# Patient Record
Sex: Female | Born: 1935
Health system: Southern US, Community
[De-identification: ages and names within clinical notes are randomized; demographics above are authoritative.]

## PROBLEM LIST (undated history)

## (undated) DIAGNOSIS — I10 Essential (primary) hypertension: Secondary | ICD-10-CM

## (undated) DIAGNOSIS — N6011 Diffuse cystic mastopathy of right breast: Secondary | ICD-10-CM

## (undated) DIAGNOSIS — Z8679 Personal history of other diseases of the circulatory system: Secondary | ICD-10-CM

## (undated) DIAGNOSIS — N6012 Diffuse cystic mastopathy of left breast: Secondary | ICD-10-CM

## (undated) HISTORY — PX: MENISCUS REPAIR: SHX5179

## (undated) HISTORY — DX: Personal history of other diseases of the circulatory system: Z86.79

## (undated) HISTORY — PX: ROTATOR CUFF REPAIR: SHX139

## (undated) HISTORY — DX: Diffuse cystic mastopathy of left breast: N60.12

## (undated) HISTORY — DX: Diffuse cystic mastopathy of right breast: N60.11

## (undated) HISTORY — PX: BREAST BIOPSY: SHX20

---

## 2012-01-09 DIAGNOSIS — N6019 Diffuse cystic mastopathy of unspecified breast: Secondary | ICD-10-CM | POA: Diagnosis not present

## 2012-01-09 DIAGNOSIS — E785 Hyperlipidemia, unspecified: Secondary | ICD-10-CM | POA: Diagnosis not present

## 2012-01-09 DIAGNOSIS — I10 Essential (primary) hypertension: Secondary | ICD-10-CM | POA: Diagnosis not present

## 2012-02-06 DIAGNOSIS — R109 Unspecified abdominal pain: Secondary | ICD-10-CM | POA: Diagnosis not present

## 2012-02-07 DIAGNOSIS — I10 Essential (primary) hypertension: Secondary | ICD-10-CM | POA: Diagnosis not present

## 2012-02-07 DIAGNOSIS — K439 Ventral hernia without obstruction or gangrene: Secondary | ICD-10-CM | POA: Diagnosis not present

## 2012-05-13 DIAGNOSIS — K219 Gastro-esophageal reflux disease without esophagitis: Secondary | ICD-10-CM | POA: Diagnosis not present

## 2012-05-13 DIAGNOSIS — J310 Chronic rhinitis: Secondary | ICD-10-CM | POA: Diagnosis not present

## 2012-05-13 DIAGNOSIS — H6120 Impacted cerumen, unspecified ear: Secondary | ICD-10-CM | POA: Diagnosis not present

## 2012-05-13 DIAGNOSIS — K2100 Gastro-esophageal reflux disease with esophagitis, without bleeding: Secondary | ICD-10-CM | POA: Diagnosis not present

## 2012-07-02 DIAGNOSIS — E785 Hyperlipidemia, unspecified: Secondary | ICD-10-CM | POA: Diagnosis not present

## 2012-07-02 DIAGNOSIS — N61 Mastitis without abscess: Secondary | ICD-10-CM | POA: Diagnosis not present

## 2012-07-02 DIAGNOSIS — Z1211 Encounter for screening for malignant neoplasm of colon: Secondary | ICD-10-CM | POA: Diagnosis not present

## 2012-07-02 DIAGNOSIS — I498 Other specified cardiac arrhythmias: Secondary | ICD-10-CM | POA: Diagnosis not present

## 2012-07-02 DIAGNOSIS — Z23 Encounter for immunization: Secondary | ICD-10-CM | POA: Diagnosis not present

## 2012-07-02 DIAGNOSIS — I10 Essential (primary) hypertension: Secondary | ICD-10-CM | POA: Diagnosis not present

## 2012-07-15 DIAGNOSIS — K2100 Gastro-esophageal reflux disease with esophagitis, without bleeding: Secondary | ICD-10-CM | POA: Diagnosis not present

## 2012-07-15 DIAGNOSIS — K219 Gastro-esophageal reflux disease without esophagitis: Secondary | ICD-10-CM | POA: Diagnosis not present

## 2012-07-15 DIAGNOSIS — H60509 Unspecified acute noninfective otitis externa, unspecified ear: Secondary | ICD-10-CM | POA: Diagnosis not present

## 2012-08-20 DIAGNOSIS — M549 Dorsalgia, unspecified: Secondary | ICD-10-CM | POA: Diagnosis not present

## 2012-08-20 DIAGNOSIS — E785 Hyperlipidemia, unspecified: Secondary | ICD-10-CM | POA: Diagnosis not present

## 2012-08-20 DIAGNOSIS — I10 Essential (primary) hypertension: Secondary | ICD-10-CM | POA: Diagnosis not present

## 2012-08-20 DIAGNOSIS — R079 Chest pain, unspecified: Secondary | ICD-10-CM | POA: Diagnosis not present

## 2012-10-15 DIAGNOSIS — Z1231 Encounter for screening mammogram for malignant neoplasm of breast: Secondary | ICD-10-CM | POA: Diagnosis not present

## 2012-10-15 DIAGNOSIS — R928 Other abnormal and inconclusive findings on diagnostic imaging of breast: Secondary | ICD-10-CM | POA: Diagnosis not present

## 2012-10-15 DIAGNOSIS — R922 Inconclusive mammogram: Secondary | ICD-10-CM | POA: Diagnosis not present

## 2012-10-27 DIAGNOSIS — N3090 Cystitis, unspecified without hematuria: Secondary | ICD-10-CM | POA: Diagnosis not present

## 2012-10-27 DIAGNOSIS — E7800 Pure hypercholesterolemia, unspecified: Secondary | ICD-10-CM | POA: Diagnosis not present

## 2012-10-27 DIAGNOSIS — I10 Essential (primary) hypertension: Secondary | ICD-10-CM | POA: Diagnosis not present

## 2012-12-23 LAB — BASIC METABOLIC PANEL
Creatinine: 0.9 mg/dL (ref 0.5–1.1)
GLUCOSE: 85 mg/dL
Potassium: 4.7 mmol/L (ref 3.4–5.3)
SODIUM: 138 mmol/L (ref 137–147)

## 2012-12-23 LAB — CBC AND DIFFERENTIAL
Hemoglobin: 14.3 g/dL (ref 12.0–16.0)
PLATELETS: 331 10*3/uL (ref 150–399)
WBC: 11.9 10^3/mL

## 2012-12-23 LAB — HEPATIC FUNCTION PANEL
ALT: 27 U/L (ref 7–35)
AST: 24 U/L (ref 13–35)
Alkaline Phosphatase: 68 U/L (ref 25–125)

## 2013-01-07 DIAGNOSIS — I10 Essential (primary) hypertension: Secondary | ICD-10-CM | POA: Diagnosis not present

## 2013-01-27 DIAGNOSIS — R252 Cramp and spasm: Secondary | ICD-10-CM | POA: Diagnosis not present

## 2013-01-27 DIAGNOSIS — I10 Essential (primary) hypertension: Secondary | ICD-10-CM | POA: Diagnosis not present

## 2013-01-27 LAB — CBC AND DIFFERENTIAL
Hemoglobin: 13.9 g/dL (ref 12.0–16.0)
Platelets: 279 10*3/uL (ref 150–399)
WBC: 9.1 10^3/mL

## 2013-02-07 DIAGNOSIS — N950 Postmenopausal bleeding: Secondary | ICD-10-CM | POA: Diagnosis not present

## 2013-02-07 DIAGNOSIS — D259 Leiomyoma of uterus, unspecified: Secondary | ICD-10-CM | POA: Diagnosis not present

## 2013-02-17 DIAGNOSIS — I10 Essential (primary) hypertension: Secondary | ICD-10-CM | POA: Diagnosis not present

## 2013-02-17 DIAGNOSIS — J029 Acute pharyngitis, unspecified: Secondary | ICD-10-CM | POA: Diagnosis not present

## 2013-02-17 DIAGNOSIS — J0110 Acute frontal sinusitis, unspecified: Secondary | ICD-10-CM | POA: Diagnosis not present

## 2013-03-03 DIAGNOSIS — S93409A Sprain of unspecified ligament of unspecified ankle, initial encounter: Secondary | ICD-10-CM | POA: Diagnosis not present

## 2013-03-03 DIAGNOSIS — M2060 Acquired deformities of toe(s), unspecified, unspecified foot: Secondary | ICD-10-CM | POA: Diagnosis not present

## 2013-04-11 DIAGNOSIS — J209 Acute bronchitis, unspecified: Secondary | ICD-10-CM | POA: Diagnosis not present

## 2013-04-11 DIAGNOSIS — J0110 Acute frontal sinusitis, unspecified: Secondary | ICD-10-CM | POA: Diagnosis not present

## 2013-04-11 DIAGNOSIS — I10 Essential (primary) hypertension: Secondary | ICD-10-CM | POA: Diagnosis not present

## 2013-05-26 DIAGNOSIS — K219 Gastro-esophageal reflux disease without esophagitis: Secondary | ICD-10-CM | POA: Diagnosis not present

## 2013-05-26 DIAGNOSIS — J310 Chronic rhinitis: Secondary | ICD-10-CM | POA: Diagnosis not present

## 2013-06-26 DIAGNOSIS — K2970 Gastritis, unspecified, without bleeding: Secondary | ICD-10-CM | POA: Diagnosis not present

## 2013-07-15 DIAGNOSIS — E669 Obesity, unspecified: Secondary | ICD-10-CM | POA: Diagnosis not present

## 2013-07-15 DIAGNOSIS — E785 Hyperlipidemia, unspecified: Secondary | ICD-10-CM | POA: Diagnosis not present

## 2013-07-15 DIAGNOSIS — Z23 Encounter for immunization: Secondary | ICD-10-CM | POA: Diagnosis not present

## 2013-07-15 DIAGNOSIS — K219 Gastro-esophageal reflux disease without esophagitis: Secondary | ICD-10-CM | POA: Diagnosis not present

## 2013-07-15 DIAGNOSIS — I10 Essential (primary) hypertension: Secondary | ICD-10-CM | POA: Diagnosis not present

## 2013-07-15 LAB — LIPID PANEL
Cholesterol: 174 mg/dL (ref 0–200)
HDL: 67 mg/dL (ref 35–70)
LDL Cholesterol: 84 mg/dL
Triglycerides: 113 mg/dL (ref 40–160)

## 2013-07-15 LAB — TSH: TSH: 2.04 u[IU]/mL (ref 0.41–5.90)

## 2013-08-05 DIAGNOSIS — I10 Essential (primary) hypertension: Secondary | ICD-10-CM | POA: Diagnosis not present

## 2013-10-15 DIAGNOSIS — Z1231 Encounter for screening mammogram for malignant neoplasm of breast: Secondary | ICD-10-CM | POA: Diagnosis not present

## 2013-10-15 DIAGNOSIS — R922 Inconclusive mammogram: Secondary | ICD-10-CM | POA: Diagnosis not present

## 2013-10-15 DIAGNOSIS — R928 Other abnormal and inconclusive findings on diagnostic imaging of breast: Secondary | ICD-10-CM | POA: Diagnosis not present

## 2013-12-29 DIAGNOSIS — IMO0001 Reserved for inherently not codable concepts without codable children: Secondary | ICD-10-CM | POA: Diagnosis not present

## 2013-12-29 DIAGNOSIS — M76899 Other specified enthesopathies of unspecified lower limb, excluding foot: Secondary | ICD-10-CM | POA: Diagnosis not present

## 2014-01-02 DIAGNOSIS — M76899 Other specified enthesopathies of unspecified lower limb, excluding foot: Secondary | ICD-10-CM | POA: Diagnosis not present

## 2014-01-02 DIAGNOSIS — IMO0001 Reserved for inherently not codable concepts without codable children: Secondary | ICD-10-CM | POA: Diagnosis not present

## 2014-01-05 DIAGNOSIS — IMO0001 Reserved for inherently not codable concepts without codable children: Secondary | ICD-10-CM | POA: Diagnosis not present

## 2014-01-05 DIAGNOSIS — M76899 Other specified enthesopathies of unspecified lower limb, excluding foot: Secondary | ICD-10-CM | POA: Diagnosis not present

## 2014-01-08 DIAGNOSIS — IMO0001 Reserved for inherently not codable concepts without codable children: Secondary | ICD-10-CM | POA: Diagnosis not present

## 2014-01-08 DIAGNOSIS — M76899 Other specified enthesopathies of unspecified lower limb, excluding foot: Secondary | ICD-10-CM | POA: Diagnosis not present

## 2014-01-12 DIAGNOSIS — IMO0001 Reserved for inherently not codable concepts without codable children: Secondary | ICD-10-CM | POA: Diagnosis not present

## 2014-01-12 DIAGNOSIS — M76899 Other specified enthesopathies of unspecified lower limb, excluding foot: Secondary | ICD-10-CM | POA: Diagnosis not present

## 2014-01-15 DIAGNOSIS — M76899 Other specified enthesopathies of unspecified lower limb, excluding foot: Secondary | ICD-10-CM | POA: Diagnosis not present

## 2014-01-15 DIAGNOSIS — IMO0001 Reserved for inherently not codable concepts without codable children: Secondary | ICD-10-CM | POA: Diagnosis not present

## 2014-01-19 DIAGNOSIS — M76899 Other specified enthesopathies of unspecified lower limb, excluding foot: Secondary | ICD-10-CM | POA: Diagnosis not present

## 2014-01-19 DIAGNOSIS — IMO0001 Reserved for inherently not codable concepts without codable children: Secondary | ICD-10-CM | POA: Diagnosis not present

## 2014-02-02 DIAGNOSIS — K219 Gastro-esophageal reflux disease without esophagitis: Secondary | ICD-10-CM | POA: Diagnosis not present

## 2014-02-02 DIAGNOSIS — J310 Chronic rhinitis: Secondary | ICD-10-CM | POA: Diagnosis not present

## 2014-02-03 DIAGNOSIS — K219 Gastro-esophageal reflux disease without esophagitis: Secondary | ICD-10-CM | POA: Diagnosis not present

## 2014-02-03 DIAGNOSIS — J309 Allergic rhinitis, unspecified: Secondary | ICD-10-CM | POA: Diagnosis not present

## 2014-02-03 DIAGNOSIS — F3289 Other specified depressive episodes: Secondary | ICD-10-CM | POA: Diagnosis not present

## 2014-02-03 DIAGNOSIS — J387 Other diseases of larynx: Secondary | ICD-10-CM | POA: Diagnosis not present

## 2014-02-03 DIAGNOSIS — E785 Hyperlipidemia, unspecified: Secondary | ICD-10-CM | POA: Diagnosis not present

## 2014-02-03 DIAGNOSIS — I10 Essential (primary) hypertension: Secondary | ICD-10-CM | POA: Diagnosis not present

## 2014-02-23 DIAGNOSIS — D131 Benign neoplasm of stomach: Secondary | ICD-10-CM | POA: Diagnosis not present

## 2014-02-23 DIAGNOSIS — K222 Esophageal obstruction: Secondary | ICD-10-CM | POA: Diagnosis not present

## 2014-02-23 DIAGNOSIS — D1330 Benign neoplasm of unspecified part of small intestine: Secondary | ICD-10-CM | POA: Diagnosis not present

## 2014-02-23 DIAGNOSIS — K319 Disease of stomach and duodenum, unspecified: Secondary | ICD-10-CM | POA: Diagnosis not present

## 2014-03-05 DIAGNOSIS — L820 Inflamed seborrheic keratosis: Secondary | ICD-10-CM | POA: Diagnosis not present

## 2014-03-05 DIAGNOSIS — Q8509 Other neurofibromatosis: Secondary | ICD-10-CM | POA: Diagnosis not present

## 2014-04-30 DIAGNOSIS — S0100XA Unspecified open wound of scalp, initial encounter: Secondary | ICD-10-CM | POA: Diagnosis not present

## 2014-05-05 DIAGNOSIS — I498 Other specified cardiac arrhythmias: Secondary | ICD-10-CM | POA: Diagnosis not present

## 2014-05-05 DIAGNOSIS — G4700 Insomnia, unspecified: Secondary | ICD-10-CM | POA: Diagnosis not present

## 2014-05-05 DIAGNOSIS — I10 Essential (primary) hypertension: Secondary | ICD-10-CM | POA: Diagnosis not present

## 2014-05-05 DIAGNOSIS — I491 Atrial premature depolarization: Secondary | ICD-10-CM | POA: Diagnosis not present

## 2014-05-05 DIAGNOSIS — S0990XA Unspecified injury of head, initial encounter: Secondary | ICD-10-CM | POA: Diagnosis not present

## 2014-06-06 DIAGNOSIS — Z0000 Encounter for general adult medical examination without abnormal findings: Secondary | ICD-10-CM | POA: Diagnosis not present

## 2014-06-08 DIAGNOSIS — K219 Gastro-esophageal reflux disease without esophagitis: Secondary | ICD-10-CM | POA: Diagnosis not present

## 2014-06-08 DIAGNOSIS — J301 Allergic rhinitis due to pollen: Secondary | ICD-10-CM | POA: Diagnosis not present

## 2014-06-08 DIAGNOSIS — H6120 Impacted cerumen, unspecified ear: Secondary | ICD-10-CM | POA: Diagnosis not present

## 2014-06-08 DIAGNOSIS — K2100 Gastro-esophageal reflux disease with esophagitis, without bleeding: Secondary | ICD-10-CM | POA: Diagnosis not present

## 2014-07-27 DIAGNOSIS — M25559 Pain in unspecified hip: Secondary | ICD-10-CM | POA: Diagnosis not present

## 2014-07-27 DIAGNOSIS — M7071 Other bursitis of hip, right hip: Secondary | ICD-10-CM | POA: Diagnosis not present

## 2014-07-27 DIAGNOSIS — M25551 Pain in right hip: Secondary | ICD-10-CM | POA: Diagnosis not present

## 2014-07-28 DIAGNOSIS — J209 Acute bronchitis, unspecified: Secondary | ICD-10-CM | POA: Diagnosis not present

## 2014-08-04 DIAGNOSIS — I10 Essential (primary) hypertension: Secondary | ICD-10-CM | POA: Diagnosis not present

## 2014-08-04 DIAGNOSIS — J309 Allergic rhinitis, unspecified: Secondary | ICD-10-CM | POA: Diagnosis not present

## 2014-08-04 DIAGNOSIS — M791 Myalgia: Secondary | ICD-10-CM | POA: Diagnosis not present

## 2014-08-04 DIAGNOSIS — E785 Hyperlipidemia, unspecified: Secondary | ICD-10-CM | POA: Diagnosis not present

## 2014-08-04 DIAGNOSIS — Z23 Encounter for immunization: Secondary | ICD-10-CM | POA: Diagnosis not present

## 2014-08-04 DIAGNOSIS — K219 Gastro-esophageal reflux disease without esophagitis: Secondary | ICD-10-CM | POA: Diagnosis not present

## 2014-08-11 DIAGNOSIS — F329 Major depressive disorder, single episode, unspecified: Secondary | ICD-10-CM | POA: Diagnosis not present

## 2014-08-11 DIAGNOSIS — J309 Allergic rhinitis, unspecified: Secondary | ICD-10-CM | POA: Diagnosis not present

## 2014-08-11 DIAGNOSIS — J387 Other diseases of larynx: Secondary | ICD-10-CM | POA: Diagnosis not present

## 2014-08-11 DIAGNOSIS — E785 Hyperlipidemia, unspecified: Secondary | ICD-10-CM | POA: Diagnosis not present

## 2014-08-11 DIAGNOSIS — I10 Essential (primary) hypertension: Secondary | ICD-10-CM | POA: Diagnosis not present

## 2014-08-11 DIAGNOSIS — I491 Atrial premature depolarization: Secondary | ICD-10-CM | POA: Diagnosis not present

## 2014-08-11 DIAGNOSIS — K219 Gastro-esophageal reflux disease without esophagitis: Secondary | ICD-10-CM | POA: Diagnosis not present

## 2014-08-11 DIAGNOSIS — D132 Benign neoplasm of duodenum: Secondary | ICD-10-CM | POA: Diagnosis not present

## 2014-08-18 DIAGNOSIS — M7071 Other bursitis of hip, right hip: Secondary | ICD-10-CM | POA: Diagnosis not present

## 2014-08-18 DIAGNOSIS — M5431 Sciatica, right side: Secondary | ICD-10-CM | POA: Diagnosis not present

## 2014-08-20 DIAGNOSIS — M7071 Other bursitis of hip, right hip: Secondary | ICD-10-CM | POA: Diagnosis not present

## 2014-08-20 DIAGNOSIS — M5431 Sciatica, right side: Secondary | ICD-10-CM | POA: Diagnosis not present

## 2014-08-25 DIAGNOSIS — R143 Flatulence: Secondary | ICD-10-CM | POA: Diagnosis not present

## 2014-08-25 DIAGNOSIS — M5431 Sciatica, right side: Secondary | ICD-10-CM | POA: Diagnosis not present

## 2014-08-25 DIAGNOSIS — K5901 Slow transit constipation: Secondary | ICD-10-CM | POA: Diagnosis not present

## 2014-08-25 DIAGNOSIS — M7071 Other bursitis of hip, right hip: Secondary | ICD-10-CM | POA: Diagnosis not present

## 2014-08-27 DIAGNOSIS — M7071 Other bursitis of hip, right hip: Secondary | ICD-10-CM | POA: Diagnosis not present

## 2014-08-27 DIAGNOSIS — M5431 Sciatica, right side: Secondary | ICD-10-CM | POA: Diagnosis not present

## 2014-08-31 DIAGNOSIS — N6082 Other benign mammary dysplasias of left breast: Secondary | ICD-10-CM | POA: Diagnosis not present

## 2014-09-01 DIAGNOSIS — R311 Benign essential microscopic hematuria: Secondary | ICD-10-CM | POA: Diagnosis not present

## 2014-09-01 DIAGNOSIS — R312 Other microscopic hematuria: Secondary | ICD-10-CM | POA: Diagnosis not present

## 2014-09-01 DIAGNOSIS — M5431 Sciatica, right side: Secondary | ICD-10-CM | POA: Diagnosis not present

## 2014-09-01 DIAGNOSIS — M7071 Other bursitis of hip, right hip: Secondary | ICD-10-CM | POA: Diagnosis not present

## 2014-09-03 DIAGNOSIS — M5431 Sciatica, right side: Secondary | ICD-10-CM | POA: Diagnosis not present

## 2014-09-03 DIAGNOSIS — M7071 Other bursitis of hip, right hip: Secondary | ICD-10-CM | POA: Diagnosis not present

## 2014-09-08 DIAGNOSIS — R922 Inconclusive mammogram: Secondary | ICD-10-CM | POA: Diagnosis not present

## 2014-09-08 DIAGNOSIS — R921 Mammographic calcification found on diagnostic imaging of breast: Secondary | ICD-10-CM | POA: Diagnosis not present

## 2014-09-08 DIAGNOSIS — Z1231 Encounter for screening mammogram for malignant neoplasm of breast: Secondary | ICD-10-CM | POA: Diagnosis not present

## 2014-09-14 DIAGNOSIS — M7542 Impingement syndrome of left shoulder: Secondary | ICD-10-CM | POA: Diagnosis not present

## 2014-09-15 DIAGNOSIS — Z01419 Encounter for gynecological examination (general) (routine) without abnormal findings: Secondary | ICD-10-CM | POA: Diagnosis not present

## 2014-09-15 DIAGNOSIS — Z13820 Encounter for screening for osteoporosis: Secondary | ICD-10-CM | POA: Diagnosis not present

## 2014-09-15 DIAGNOSIS — N6019 Diffuse cystic mastopathy of unspecified breast: Secondary | ICD-10-CM | POA: Diagnosis not present

## 2015-02-03 ENCOUNTER — Encounter: Payer: Self-pay | Admitting: Family Medicine

## 2015-02-03 ENCOUNTER — Ambulatory Visit (INDEPENDENT_AMBULATORY_CARE_PROVIDER_SITE_OTHER): Payer: Medicare Other | Admitting: Family Medicine

## 2015-02-03 VITALS — BP 136/85 | HR 60 | Wt 170.0 lb

## 2015-02-03 DIAGNOSIS — K2100 Gastro-esophageal reflux disease with esophagitis, without bleeding: Secondary | ICD-10-CM

## 2015-02-03 DIAGNOSIS — I10 Essential (primary) hypertension: Secondary | ICD-10-CM

## 2015-02-03 DIAGNOSIS — R312 Other microscopic hematuria: Secondary | ICD-10-CM

## 2015-02-03 DIAGNOSIS — N6011 Diffuse cystic mastopathy of right breast: Secondary | ICD-10-CM

## 2015-02-03 DIAGNOSIS — F411 Generalized anxiety disorder: Secondary | ICD-10-CM

## 2015-02-03 DIAGNOSIS — R3129 Other microscopic hematuria: Secondary | ICD-10-CM

## 2015-02-03 DIAGNOSIS — E785 Hyperlipidemia, unspecified: Secondary | ICD-10-CM | POA: Insufficient documentation

## 2015-02-03 DIAGNOSIS — K219 Gastro-esophageal reflux disease without esophagitis: Secondary | ICD-10-CM | POA: Insufficient documentation

## 2015-02-03 DIAGNOSIS — M5136 Other intervertebral disc degeneration, lumbar region: Secondary | ICD-10-CM | POA: Diagnosis not present

## 2015-02-03 DIAGNOSIS — J4520 Mild intermittent asthma, uncomplicated: Secondary | ICD-10-CM

## 2015-02-03 DIAGNOSIS — J45909 Unspecified asthma, uncomplicated: Secondary | ICD-10-CM | POA: Insufficient documentation

## 2015-02-03 DIAGNOSIS — N6012 Diffuse cystic mastopathy of left breast: Secondary | ICD-10-CM

## 2015-02-03 MED ORDER — ALBUTEROL SULFATE HFA 108 (90 BASE) MCG/ACT IN AERS: 2.0000 | INHALATION_SPRAY | Freq: Four times a day (QID) | RESPIRATORY_TRACT | Status: DC | PRN

## 2015-02-03 NOTE — Assessment & Plan Note (Signed)
Tolerating Lipitor well without any side effects. Try to review her most recent lab work.

## 2015-02-03 NOTE — Assessment & Plan Note (Signed)
We'll monitor for recurrent UTIs. She typically has an extra Cipro prescription on hand to use if she notices any gross hematuria

## 2015-02-03 NOTE — Assessment & Plan Note (Signed)
I did go ahead and provide albuterol inhaler.

## 2015-02-03 NOTE — Assessment & Plan Note (Signed)
Well-controlled. Continue it current regimen. We discussed that typically I do blood work twice a year based on her current medications and diagnoses. I asked her when her next labs are due but she wasn't sure. I'll try to review the records and see where we're at.

## 2015-02-03 NOTE — Assessment & Plan Note (Signed)
Currently well controlled on Nexium. Unfortunately her insurance is changing and the price will go up significantly. We could consider trying a different PPI or possibly stepping down to Zantac. She think she had taken that in the past and it was not effective. Encouraged her to call her insurance company or bring in her list of medications to see what might be a lower tier drug.

## 2015-02-03 NOTE — Assessment & Plan Note (Signed)
Gets yearly mammogram and ultrasound. She is up-to-date. Last imaging was in November.

## 2015-02-03 NOTE — Progress Notes (Signed)
Subjective:    Patient ID: Belinda Owens, female    DOB: 1932/03/29, 81 y.o.   MRN: [id number]  HPI patient is here to establish care today. She was referred by another patient. She has a history of hypertension, hyperlipidemia, reflux, allergies.  Left shoulder pain and numbness.  Feels some tingling across her left clavicle.  Has had xrays on the left shoulder a couple of years ago that showed some arthritis.  Had injections in Novembers since injection in her left shoulder right hip.    Has known tear of meniscus on the left knee and Bakers cyst on the left as well.  Has had MRI as well.  Last injeciton was bout 2 years ago for this joint.     Review of Systems  Constitutional: Negative for fever, diaphoresis and unexpected weight change.  HENT: Negative for hearing loss, rhinorrhea and tinnitus.   Eyes: Negative for visual disturbance.  Respiratory: Negative for cough and wheezing.   Cardiovascular: Negative for chest pain and palpitations.  Gastrointestinal: Negative for nausea, vomiting, diarrhea and blood in stool.  Genitourinary: Negative for vaginal bleeding, vaginal discharge and difficulty urinating.  Musculoskeletal: Negative for myalgias and arthralgias.  Skin: Negative for rash.  Neurological: Negative for headaches.  Hematological: Negative for adenopathy. Does not bruise/bleed easily.  Psychiatric/Behavioral: Negative for sleep disturbance and dysphoric mood. The patient is not nervous/anxious.     BP 136/85 mmHg  Pulse 60  Wt 170 lb (77.111 kg)    Allergies  Allergen Reactions  . Omeprazole Itching    Past Medical History  Diagnosis Date  . Fibrocystic breast changes of both breasts     Gets yearly mammo and [place]    Past Surgical History  Procedure Laterality Date  . Breast biopsy    . Meniscus repair Right   . Rotator cuff repair Right     History   Social History  . Marital Status: Divorced    Spouse Name: N/A  . Number of Children: 3  . Years  of Education: some colle   Occupational History  . retired.     Social History Main Topics  . Smoking status: Former Smoker    Types: Cigarettes    Quit date: 10/23/1957  . Smokeless tobacco: Not on file  . Alcohol Use: Not on file  . Drug Use: Not on file  . Sexual Activity: Not on file   Other Topics Concern  . Not on file   Social History Narrative   Drinks decaf coffee.  Quit smoking 1959.     Family History  Problem Relation Age of Onset  . Breast cancer Maternal Aunt   . Colon cancer Father   . Asthma Daughter     Outpatient Encounter Prescriptions as of 02/03/2015  Medication Sig  . albuterol (PROVENTIL HFA;VENTOLIN HFA) 108 (90 BASE) MCG/ACT inhaler Inhale 2 puffs into the lungs every 6 (six) hours as needed for wheezing or shortness of breath.  Marland Kitchen atenolol (TENORMIN) 25 MG tablet Take 1 tablet by mouth daily.  Marland Kitchen atorvastatin (LIPITOR) 20 MG tablet Take 1 tablet by mouth at bedtime.  Marland Kitchen losartan-hydrochlorothiazide (HYZAAR) 100-25 MG per tablet Take 1 tablet by mouth daily.  . montelukast (SINGULAIR) 10 MG tablet Take 1 tablet by mouth daily.  Marland Kitchen venlafaxine (EFFEXOR) 37.5 MG tablet Take 1 tablet by mouth daily.           Objective:   Physical Exam  Constitutional: She is oriented to person, place, and time. She  appears well-developed and well-nourished.  HENT:  Head: Normocephalic and atraumatic.  Right Ear: External ear normal.  Left Ear: External ear normal.  Nose: Nose normal.  Eyes: Conjunctivae and EOM are normal.  Neck: Neck supple. No thyromegaly present.  Cardiovascular: Normal rate, regular rhythm and normal heart sounds.   No carotid bruits. Radial pulses 2+ bilaterally.  Pulmonary/Chest: Effort normal and breath sounds normal.  Musculoskeletal: She exhibits no edema.  Lymphadenopathy:    She has no cervical adenopathy.  Neurological: She is alert and oriented to person, place, and time.  Skin: Skin is warm and dry. No pallor.  Psychiatric:  She has a normal mood and affect. Her behavior is normal.          Assessment & Plan:

## 2015-02-03 NOTE — Assessment & Plan Note (Signed)
Doing well on low-dose Effexor. We did discuss at some point time trying to wean her completely off. She has a lot of stressful things going on and she just moved here 4 months ago. Encouraged her to get settled into the local area and may be in 6-12 months we can look at weaning the medication if she's doing really well.

## 2015-02-04 ENCOUNTER — Other Ambulatory Visit: Payer: Self-pay | Admitting: *Deleted

## 2015-02-04 MED ORDER — ATENOLOL 25 MG PO TABS: 25.0000 mg | ORAL_TABLET | Freq: Every day | ORAL | Status: DC

## 2015-02-05 ENCOUNTER — Emergency Department (INDEPENDENT_AMBULATORY_CARE_PROVIDER_SITE_OTHER): Payer: Medicare Other

## 2015-02-05 ENCOUNTER — Emergency Department (INDEPENDENT_AMBULATORY_CARE_PROVIDER_SITE_OTHER)
Admission: EM | Admit: 2015-02-05 | Discharge: 2015-02-05 | Disposition: A | Discharge status: Home / Self Care | Payer: Medicare Other | Source: Home / Self Care | Attending: Family Medicine | Admitting: Family Medicine

## 2015-02-05 ENCOUNTER — Encounter: Payer: Self-pay | Admitting: *Deleted

## 2015-02-05 DIAGNOSIS — S60012A Contusion of left thumb without damage to nail, initial encounter: Secondary | ICD-10-CM

## 2015-02-05 DIAGNOSIS — J3489 Other specified disorders of nose and nasal sinuses: Secondary | ICD-10-CM

## 2015-02-05 DIAGNOSIS — S0993XA Unspecified injury of face, initial encounter: Secondary | ICD-10-CM | POA: Diagnosis not present

## 2015-02-05 DIAGNOSIS — S0033XA Contusion of nose, initial encounter: Secondary | ICD-10-CM | POA: Diagnosis not present

## 2015-02-05 DIAGNOSIS — M25532 Pain in left wrist: Secondary | ICD-10-CM | POA: Diagnosis not present

## 2015-02-05 DIAGNOSIS — S6992XA Unspecified injury of left wrist, hand and finger(s), initial encounter: Secondary | ICD-10-CM | POA: Diagnosis not present

## 2015-02-05 NOTE — Discharge Instructions (Signed)
Apply ice pack for 10 to 20 minutes, 3 to 4 times daily (less time on nose).  Continue until pain decreases.  Wear splint for 7 to 10 days.  May take Tylenol as needed for pain.   Contusion A contusion is a deep bruise. Contusions are the result of an injury that caused bleeding under the skin. The contusion may turn blue, purple, or yellow. Minor injuries will give you a painless contusion, but more severe contusions may stay painful and swollen for a few weeks.  CAUSES  A contusion is usually caused by a blow, trauma, or direct force to an area of the body. SYMPTOMS   Swelling and redness of the injured area.  Bruising of the injured area.  Tenderness and soreness of the injured area.  Pain. DIAGNOSIS  The diagnosis can be made by taking a history and physical exam. An X-ray, CT scan, or MRI may be needed to determine if there were any associated injuries, such as fractures. TREATMENT  Specific treatment will depend on what area of the body was injured. In general, the best treatment for a contusion is resting, icing, elevating, and applying cold compresses to the injured area. Over-the-counter medicines may also be recommended for pain control. Ask your caregiver what the best treatment is for your contusion. HOME CARE INSTRUCTIONS   Put ice on the injured area.  Put ice in a plastic bag.  Place a towel between your skin and the bag.  Leave the ice on for 15-20 minutes, 3-4 times a day, or as directed by your health care provider.  Only take over-the-counter or prescription medicines for pain, discomfort, or fever as directed by your caregiver. Your caregiver may recommend avoiding anti-inflammatory medicines (aspirin, ibuprofen, and naproxen) for 48 hours because these medicines may increase bruising.  Rest the injured area.  If possible, elevate the injured area to reduce swelling. SEEK IMMEDIATE MEDICAL CARE IF:   You have increased bruising or swelling.  You have pain  that is getting worse.  Your swelling or pain is not relieved with medicines. MAKE SURE YOU:   Understand these instructions.  Will watch your condition.  Will get help right away if you are not doing well or get worse. Document Released: 07/19/2005 Document Revised: 10/14/2013 Document Reviewed: 08/14/2011 [facility] Patient Information 2015 [place], [place]. This information is not intended to replace advice given to you by your health care provider. Make sure you discuss any questions you have with your health care provider.

## 2015-02-05 NOTE — ED Provider Notes (Signed)
CSN: [id number]     Arrival date & time 02/05/15  1047 History   First MD Initiated Contact with Patient 02/05/15 1127     Chief Complaint  Patient presents with  . Hand Pain  . Fall     HPI Comments: While walking in a gym today, patient slipped and fell.  She complains of pain in her left hand/wrist, and minimal pain in her left knee.  She bumped her nose and complains of soreness over the bridge of her nose.  No loss of consciousness.  No headache or neurologic symptoms.  Patient is a 81 y.o. female presenting with wrist pain. The history is provided by the patient.  Wrist Pain This is a new problem. The current episode started 1 to 2 hours ago. The problem has not changed since onset.Pertinent negatives include no headaches. Exacerbated by: movement of left hand/wrist. Nothing relieves the symptoms. She has tried nothing for the symptoms.    Past Medical History  Diagnosis Date  . Fibrocystic breast changes of both breasts     Gets yearly mammo and [place]   Past Surgical History  Procedure Laterality Date  . Breast biopsy    . Meniscus repair Right   . Rotator cuff repair Right    Family History  Problem Relation Age of Onset  . Breast cancer Maternal Aunt   . Colon cancer Father   . Asthma Daughter    History  Substance Use Topics  . Smoking status: Former Smoker    Types: Cigarettes    Quit date: 10/23/1957  . Smokeless tobacco: Not on file  . Alcohol Use: Not on file   OB History    No data available     Review of Systems  Constitutional: Negative.   HENT: Negative for facial swelling.        No epistaxis  Eyes: Negative.   Respiratory: Negative.   Cardiovascular: Negative.   Gastrointestinal: Negative.   Genitourinary: Negative.   Musculoskeletal: Negative for joint swelling.  Skin: Negative for wound.  Neurological: Negative for dizziness, light-headedness, numbness and headaches.    Allergies  Omeprazole  Home Medications   Prior to Admission  medications   Medication Sig Start Date End Date Taking? Authorizing Provider  albuterol (PROVENTIL HFA;VENTOLIN HFA) 108 (90 BASE) MCG/ACT inhaler Inhale 2 puffs into the lungs every 6 (six) hours as needed for wheezing or shortness of breath. 02/03/15   Hali Marry, MD  atenolol (TENORMIN) 25 MG tablet Take 1 tablet (25 mg total) by mouth daily. 02/04/15   Hali Marry, MD  atorvastatin (LIPITOR) 20 MG tablet Take 1 tablet by mouth at bedtime. 10/28/14   Historical Provider, MD  losartan-hydrochlorothiazide (HYZAAR) 100-25 MG per tablet Take 1 tablet by mouth daily. 11/29/14   Historical Provider, MD  montelukast (SINGULAIR) 10 MG tablet Take 1 tablet by mouth daily. 10/28/14   Historical Provider, MD  venlafaxine (EFFEXOR) 37.5 MG tablet Take 1 tablet by mouth daily. 12/14/14   Historical Provider, MD   BP 143/82 mmHg  Pulse 52  Resp 14  Wt 170 lb (77.111 kg)  SpO2 96% Physical Exam  Constitutional: She appears well-developed and well-nourished. No distress.  HENT:  Head: Normocephalic.  Right Ear: External ear normal.  Left Ear: External ear normal.  Nose: Sinus tenderness present. No nose lacerations, nasal deformity or nasal septal hematoma. No epistaxis.    Mouth/Throat: Oropharynx is clear and moist.  Nose has tenderness to palpation as noted on diagram.  Minimal swelling, and no deformity.  No septal swelling.    Eyes: Conjunctivae and EOM are normal. Pupils are equal, round, and reactive to light.  Neck: Normal range of motion.  Musculoskeletal:       Left hand: She exhibits tenderness and bony tenderness. She exhibits normal range of motion, normal two-point discrimination, normal capillary refill, no deformity, no laceration and no swelling.       Hands: Left hand has tenderness over the proximal first metacarpal, and tenderness over the first carpometacarpal joint.  Minimal swelling present.  Distal neurovascular function is intact.   Skin: Skin is warm and dry.    Nursing note and vitals reviewed.   ED Course  Procedures  none  Imaging Review Dg Nasal Bones  02/05/2015   CLINICAL DATA:  Nasal pain after falling on face at [facility] today. Initial encounter.  EXAM: NASAL BONES - 3+ VIEW  COMPARISON:  None.  FINDINGS: There is no evidence of fracture or other bone abnormality. Visualized paranasal sinuses appear normal.  IMPRESSION: No abnormality seen.   Electronically Signed   By: Marijo Conception, M.D.   On: 02/05/2015 12:21   Dg Wrist Complete Left  02/05/2015   CLINICAL DATA:  Fall this morning, landed on hands  EXAM: LEFT WRIST - COMPLETE 3+ VIEW  COMPARISON:  None.  FINDINGS: Four views of the left wrist submitted. No acute fracture or subluxation. Mild narrowing of radiocarpal joint space. Degenerative changes first carpometacarpal joint.  IMPRESSION: No acute fracture or subluxation. Degenerative changes as described above.   Electronically Signed   By: Lahoma Crocker M.D.   On: 02/05/2015 12:22     MDM   1. Contusion, nose, initial encounter   2. Contusion of left thumb, initial encounter     Thumb spica splint applied. Apply ice pack for 10 to 20 minutes, 3 to 4 times daily (less time on nose).  Continue until pain decreases.  Wear splint for 7 to 10 days.  May take Tylenol as needed for pain. Followup with Dr. Aundria Mems ([facility]) if not improving about two weeks.     Kandra Nicolas, MD 02/08/15 [phone]

## 2015-02-05 NOTE — ED Notes (Signed)
Pt report tripping and falling while at the gym today. She braced herself with her left hand/ C/o left hand/wrist pain and left knee pain. SHe also reports some facial sorrness on the bridge of her nose.

## 2015-02-08 ENCOUNTER — Other Ambulatory Visit: Payer: Self-pay | Admitting: *Deleted

## 2015-02-08 MED ORDER — MONTELUKAST SODIUM 10 MG PO TABS: 10.0000 mg | ORAL_TABLET | Freq: Every day | ORAL | Status: DC

## 2015-02-08 MED ORDER — ALBUTEROL SULFATE HFA 108 (90 BASE) MCG/ACT IN AERS: 2.0000 | INHALATION_SPRAY | Freq: Four times a day (QID) | RESPIRATORY_TRACT | Status: DC | PRN

## 2015-02-09 ENCOUNTER — Telehealth: Payer: Self-pay | Admitting: *Deleted

## 2015-02-09 MED ORDER — ALBUTEROL SULFATE HFA 108 (90 BASE) MCG/ACT IN AERS: 2.0000 | INHALATION_SPRAY | Freq: Four times a day (QID) | RESPIRATORY_TRACT | Status: DC | PRN

## 2015-02-09 NOTE — Telephone Encounter (Signed)
[facility] called and the albuterol (proventil) inhaler was not covered by pt's ins so Proair was given instead.

## 2015-02-24 ENCOUNTER — Other Ambulatory Visit: Payer: Self-pay

## 2015-02-24 MED ORDER — LOSARTAN POTASSIUM-HCTZ 100-25 MG PO TABS: 1.0000 | ORAL_TABLET | Freq: Every day | ORAL | Status: DC

## 2015-03-09 ENCOUNTER — Ambulatory Visit (INDEPENDENT_AMBULATORY_CARE_PROVIDER_SITE_OTHER): Payer: Medicare Other | Admitting: Family Medicine

## 2015-03-09 ENCOUNTER — Encounter: Payer: Self-pay | Admitting: Family Medicine

## 2015-03-09 VITALS — BP 137/80 | HR 55 | Ht 62.0 in | Wt 163.0 lb

## 2015-03-09 DIAGNOSIS — D132 Benign neoplasm of duodenum: Secondary | ICD-10-CM

## 2015-03-09 DIAGNOSIS — M25512 Pain in left shoulder: Secondary | ICD-10-CM | POA: Diagnosis not present

## 2015-03-09 DIAGNOSIS — G4700 Insomnia, unspecified: Secondary | ICD-10-CM | POA: Diagnosis not present

## 2015-03-09 DIAGNOSIS — J019 Acute sinusitis, unspecified: Secondary | ICD-10-CM

## 2015-03-09 DIAGNOSIS — K219 Gastro-esophageal reflux disease without esophagitis: Secondary | ICD-10-CM

## 2015-03-09 MED ORDER — AZITHROMYCIN 250 MG PO TABS: ORAL_TABLET | ORAL | Status: DC

## 2015-03-09 MED ORDER — ZOLPIDEM TARTRATE 5 MG PO TABS: 5.0000 mg | ORAL_TABLET | Freq: Every evening | ORAL | Status: DC | PRN

## 2015-03-09 NOTE — Progress Notes (Signed)
Subjective:    Patient ID: Belinda Owens, female    DOB: 17-Nov-1931, 81 y.o.   MRN: [id number]  HPI Agents that she initially started having left shoulder pain in November and actually saw her orthopedist in [place] before moving here. She was diagnosed with impingement syndrome of the left shoulder. She has a prior history of surgical repair of the meniscus and rotator cuff on the right side. She was given a cortisone shot that helped leaf for about 3 months. She denied any known injury or mechanism of injury to the left shoulder. Her pain has been more prominent at night and has started to affect her sleep. Pain is 8 out of 10 with motion but is minimal at rest. Aleve does help but she tries to use that sparingly. BenGay helps as well. She has not tried heat or ice. But says a hot shower is helpful as well. Feels a tingling over the anterior side of the shoulder towards her neck.  Hx of small growth on the colon dx about a year ago in [place] - Was told to f/u in one year for endoscopy.  Needs new GI referral - duodenal adenoma with dysplasia.    2-3 weeks of dizziness and right ear pain and nasal congestion.  Says when it gets to this point, she has a "sinus infection".  Bones in her face are tender especially over her forehead and eyebrow ridges.  Usually a zpack. Not on an antihistamine. Says did start some flonase. She does have year round allergies.  No fever, chills.   GERD - She has switched to zantac and is doing well on it.   Insomnia-she's been on 5 mg of Ambien as needed for several years. It works well for her. She says she usually waits until she hasn't slept well for several nights before she actually takes it. She is currently on her last refill by her previous provider in [place].  Review of Systems     Objective:   Physical Exam  Constitutional: She is oriented to person, place, and time. She appears well-developed and well-nourished.  HENT:  Head: Normocephalic and  atraumatic.  Right Ear: External ear normal.  Left Ear: External ear normal.  Nose: Nose normal.  Mouth/Throat: Oropharynx is clear and moist.  TMs and canals are clear.   Eyes: Conjunctivae and EOM are normal. Pupils are equal, round, and reactive to light.  Neck: Neck supple. No thyromegaly present.  Cardiovascular: Normal rate, regular rhythm and normal heart sounds.   Pulmonary/Chest: Effort normal and breath sounds normal. She has no wheezes.  Lymphadenopathy:    She has no cervical adenopathy.  Neurological: She is alert and oriented to person, place, and time.  Skin: Skin is warm and dry.  Psychiatric: She has a normal mood and affect.          Assessment & Plan:  Left shoulder pain-  Will refer to sports medicine. She did not want to initiate any therapy etc. without being seen by specialist.  History of duodenal adenoma-due to have a repeat scope performed by GI. We will get her established here locally. Referral placed today. Next  Acute sinusitis-we'll go ahead with azithromycin. She says she responded really well to this. We did discuss potentially a trial of continued nasal steroid use to reduce the recurrence of sinus infections. She said she is Re: Tried this.  GERD-doing well on the H2 blocker which is fantastic. We discussed the potential risk  of chronic PPI use.  Insomnia-did refill her 5 mg Ambien today.

## 2015-03-16 ENCOUNTER — Ambulatory Visit (INDEPENDENT_AMBULATORY_CARE_PROVIDER_SITE_OTHER): Payer: Medicare Other

## 2015-03-16 ENCOUNTER — Encounter: Payer: Self-pay | Admitting: Family Medicine

## 2015-03-16 ENCOUNTER — Encounter: Payer: Self-pay | Admitting: Sports Medicine

## 2015-03-16 ENCOUNTER — Telehealth: Payer: Self-pay | Admitting: Family Medicine

## 2015-03-16 ENCOUNTER — Ambulatory Visit (INDEPENDENT_AMBULATORY_CARE_PROVIDER_SITE_OTHER): Payer: Medicare Other | Admitting: Sports Medicine

## 2015-03-16 VITALS — BP 134/79 | HR 60 | Ht 62.0 in | Wt 166.0 lb

## 2015-03-16 DIAGNOSIS — M5032 Other cervical disc degeneration, mid-cervical region: Secondary | ICD-10-CM | POA: Diagnosis not present

## 2015-03-16 DIAGNOSIS — M25512 Pain in left shoulder: Secondary | ICD-10-CM

## 2015-03-16 DIAGNOSIS — M5030 Other cervical disc degeneration, unspecified cervical region: Secondary | ICD-10-CM

## 2015-03-16 DIAGNOSIS — M85812 Other specified disorders of bone density and structure, left shoulder: Secondary | ICD-10-CM | POA: Diagnosis not present

## 2015-03-16 DIAGNOSIS — M47812 Spondylosis without myelopathy or radiculopathy, cervical region: Secondary | ICD-10-CM | POA: Diagnosis not present

## 2015-03-16 DIAGNOSIS — M25511 Pain in right shoulder: Secondary | ICD-10-CM | POA: Insufficient documentation

## 2015-03-16 DIAGNOSIS — M4312 Spondylolisthesis, cervical region: Secondary | ICD-10-CM | POA: Diagnosis not present

## 2015-03-16 DIAGNOSIS — M19012 Primary osteoarthritis, left shoulder: Secondary | ICD-10-CM | POA: Diagnosis not present

## 2015-03-16 NOTE — Telephone Encounter (Signed)
Ok to fill 

## 2015-03-16 NOTE — Telephone Encounter (Signed)
Pt needs refill of Venlafaxine sent to pharm.

## 2015-03-16 NOTE — Assessment & Plan Note (Signed)
X-rays, formal PT. MRI for interventional no better. She does get occasional left sided likely C7 radiculopathy as well as left sided cervical plexus radiculopathy into the left upper chest.

## 2015-03-16 NOTE — Progress Notes (Signed)
   Subjective:    I'm seeing this patient as a consultation for:  Dr. Madilyn Fireman  CC: Left shoulder pain, neck pain  HPI: This is a pleasant 81 year old female with a long history of shoulder pain and neck pain, shoulder pain is localized over the deltoid, as well as over the anterior jointline, worse with overhead activities, moderate, persistent, without radiation.  Neck pain is on the left side with occasional radiation down to the first and second fingers.  Past medical history, Surgical history, Family history not pertinant except as noted below, Social history, Allergies, and medications have been entered into the medical record, reviewed, and no changes needed.   Review of Systems: No headache, visual changes, nausea, vomiting, diarrhea, constipation, dizziness, abdominal pain, skin rash, fevers, chills, night sweats, weight loss, swollen lymph nodes, body aches, joint swelling, muscle aches, chest pain, shortness of breath, mood changes, visual or auditory hallucinations.   Objective:   General: Well Developed, well nourished, and in no acute distress.  Neuro/Psych: Alert and oriented x3, extra-ocular muscles intact, able to move all 4 extremities, sensation grossly intact. Skin: Warm and dry, no rashes noted.  Respiratory: Not using accessory muscles, speaking in full sentences, trachea midline.  Cardiovascular: Pulses palpable, no extremity edema. Abdomen: Does not appear distended. Left Shoulder: Inspection reveals no abnormalities, atrophy or asymmetry. Palpation is normal with no tenderness over AC joint or bicipital groove. ROM is full in all planes. Rotator cuff strength normal throughout. Positive Neer and Hawkin's tests, empty can. Speeds and Yergason's tests normal. Positive Obrien's, positive crank, negative clunk, and good stability. Normal scapular function observed. No painful arc and no drop arm sign. No apprehension sign  Procedure: Real-time Ultrasound Guided  Injection of left subacromial bursa Device: GE Logiq E  Verbal informed consent obtained.  Time-out conducted.  Noted no overlying erythema, induration, or other signs of local infection.  Skin prepped in a sterile fashion.  Local anesthesia: Topical Ethyl chloride.  With sterile technique and under real time ultrasound guidance:  Noted intact rotator cuff and a minimally distended bursa, 1 mL kenalog 40, 3 mL lidocaine injected easily.  Pain immediately resolved suggesting accurate placement of the medication.  Advised to call if fevers/chills, erythema, induration, drainage, or persistent bleeding.  Images permanently stored and available for review in the ultrasound unit.  Impression: Technically successful ultrasound guided injection.  Procedure: Real-time Ultrasound Guided Injection of left glenohumeral joint Device: GE Logiq E  Verbal informed consent obtained.  Time-out conducted.  Noted no overlying erythema, induration, or other signs of local infection.  Skin prepped in a sterile fashion.  Local anesthesia: Topical Ethyl chloride.  With sterile technique and under real time ultrasound guidance:  Spinal needle advanced into the glenohumeral joint from a posterior approach taking care to avoid the labrum, 1 mL kenalog 40, 4 mL lidocaine injected easily. Completed without difficulty  Pain immediately resolved suggesting accurate placement of the medication.  Advised to call if fevers/chills, erythema, induration, drainage, or persistent bleeding.  Images permanently stored and available for review in the ultrasound unit.  Impression: Technically successful ultrasound guided injection.  Impression and Recommendations:   This case required medical decision making of moderate complexity.  I spent 40 minutes with this patient, greater than 50% was face-to-face time counseling regarding the above diagnoses.

## 2015-03-16 NOTE — Assessment & Plan Note (Signed)
Pain is referable to both the glenohumeral joint and the subacromial bursa. Injections into both of the above spaces. Formal physical therapy, x-rays. Return in one month.

## 2015-03-16 NOTE — Telephone Encounter (Signed)
Is it ok to send this in? It doesn't look like you've ever prescribed this for her.  Please advise.

## 2015-03-17 ENCOUNTER — Other Ambulatory Visit: Payer: Self-pay | Admitting: *Deleted

## 2015-03-17 MED ORDER — VENLAFAXINE HCL 37.5 MG PO TABS: 37.5000 mg | ORAL_TABLET | Freq: Every day | ORAL | Status: DC

## 2015-03-17 NOTE — Telephone Encounter (Signed)
Done

## 2015-03-24 DIAGNOSIS — D369 Benign neoplasm, unspecified site: Secondary | ICD-10-CM | POA: Diagnosis not present

## 2015-03-25 ENCOUNTER — Ambulatory Visit (INDEPENDENT_AMBULATORY_CARE_PROVIDER_SITE_OTHER): Payer: Medicare Other | Admitting: Rehabilitative and Restorative Service Providers"

## 2015-03-25 DIAGNOSIS — M25612 Stiffness of left shoulder, not elsewhere classified: Secondary | ICD-10-CM

## 2015-03-25 DIAGNOSIS — M25512 Pain in left shoulder: Secondary | ICD-10-CM | POA: Diagnosis not present

## 2015-03-25 DIAGNOSIS — R531 Weakness: Secondary | ICD-10-CM | POA: Diagnosis not present

## 2015-03-25 DIAGNOSIS — M542 Cervicalgia: Secondary | ICD-10-CM

## 2015-03-25 NOTE — Therapy (Signed)
[place], [place], [address] Phone: [phone]   Fax:  [phone]  Physical Therapy Evaluation  Patient Details  Name: Belinda Owens MRN: [id number] Date of Birth: 1932-02-23 Referring Provider:  Silverio Decamp,*  Encounter Date: 03/25/2015      PT End of Session - 03/25/15 0813    Visit Number 1   PT Start Time 5009   PT Stop Time 0932   PT Time Calculation (min) 68 min   Activity Tolerance Patient tolerated treatment well;No increased pain      Past Medical History  Diagnosis Date  . Fibrocystic breast changes of both breasts     Gets yearly mammo and [place]  . History of rheumatic fever     Past Surgical History  Procedure Laterality Date  . Breast biopsy    . Meniscus repair Right   . Rotator cuff repair Right     There were no vitals filed for this visit.  Visit Diagnosis:  Pain in joint, shoulder region, left - Plan: PT plan of care cert/re-cert  Stiffness of shoulder joint, left - Plan: PT plan of care cert/re-cert  Weakness generalized - Plan: PT plan of care cert/re-cert  Cervical pain (neck) - Plan: PT plan of care cert/re-cert      Subjective Assessment - 03/25/15 0816    Subjective Lt shoulder pain beginning 03/16 with pain gradually increasing. Seen by MD and received injections with good improvement. Has very few symptoms following injection. Neck pai on an intermittent basis.   Pertinent History History of Lt shoulder pain 12/15. RCR Rt shoulder 16 years ago. HTN on medication. DDD cervical spine. LBP. Torn meniscus surgery Rt knee 9 years ago, Lt no surgery. Bursitis in Rt hip.   How long can you sit comfortably? 2-3 hours   How long can you stand comfortably? 1 hour   How long can you walk comfortably? 1 hour   Diagnostic tests X-rays   Patient Stated Goals To be able to use arms normally. Keep pain from returning.   Currently in Pain? No/denies             [facility] PT Assessment - 03/25/15 0001    Assessment   Medical Diagnosis Lt shoulder pain; cervical pain    Onset Date/Surgical Date 01/06/15   Hand Dominance Right   Next MD Visit 1 month   Balance Screen   Has the patient fallen in the past 6 months Yes   How many times? once   Has the patient had a decrease in activity level because of a fear of falling?  No   Is the patient reluctant to leave their home because of a fear of falling?  No   Home Environment   Living Environment Private residence   Living Arrangements Alone   Available Help at Discharge Family   Type of [place] Access Level entry   Home Layout One level   Prior Function   Level of Independence Independent   Vocation Retired   Leisure in the gym walking only no weights3x/week; household chores; puzzles; cards   Cognition   Overall Cognitive Status Within Functional Limits for tasks assessed   Observation/Other Assessments   Focus on Therapeutic Outcomes (FOTO)  5o% limitation   Sensation   Light Touch Appears Intact   Posture/Postural Control   Posture/Postural Control Postural limitations  working on chest lift/thoracic extension   Posture Comments head forward, shoulders rounded and  elevated, icreased thoracic kyphosis, scapular abducted and rounded along the thoracic spine   ROM / Strength   AROM / PROM / Strength AROM  cervical flex 40/ext 32/rt lat flex 28;Lt 30; Rt rot48;Lt 50   AROM   Right Shoulder Extension 55 Degrees   Right Shoulder Flexion 140 Degrees   Right Shoulder ABduction 145 Degrees   Right Shoulder Internal Rotation 40 Degrees   Right Shoulder External Rotation 80 Degrees   Left Shoulder Extension 40 Degrees   Left Shoulder Flexion 122 Degrees   Left Shoulder ABduction 129 Degrees   Left Shoulder Internal Rotation 40 Degrees   Left Shoulder External Rotation 70 Degrees   Strength   Overall Strength Within functional limits for tasks performed   Flexibility    Soft Tissue Assessment /Muscle Length --  tightness noted in pecs, traps, cervical musculature          [facility] Adult PT Treatment/Exercise - 03/25/15 0001    Shoulder Exercises: Standing   Retraction Both;10 reps  using foam roll at spine for tactile cue   Other Standing Exercises cervical retraction 5 sec hold x10 reps   Shoulder Exercises: Stretch   Corner Stretch 3 reps;20 seconds;30 seconds   Corner Stretch Limitations to patient tolerance   Modalities   Modalities Cryotherapy   Cryotherapy   Number Minutes Cryotherapy 12 Minutes   Cryotherapy Location Cervical   Type of Cryotherapy Ice pack   Manual Therapy   Manual Therapy Soft tissue mobilization;Myofascial release   Manual therapy comments tightness noted through pecs and cervical musculature   Soft tissue mobilization pecs/cervical/trap areas   Myofascial Release cervical musculature            PT Education - 03/25/15 0952    Education provided Yes   Person(s) Educated Patient   Methods Explanation;Demonstration;Tactile cues;Verbal cues          PT Short Term Goals - 03/25/15 1108    PT SHORT TERM GOAL #1   Title I in initial HEP   Time 3   Period Weeks   Status New   PT SHORT TERM GOAL #2   Title Improve shoulder ROM by 5-10 degrees   Time 3   Period Weeks   Status New   PT SHORT TERM GOAL #3   Title increase thoracic extension, improve posture and alignment   Time 4   Period Weeks   Status New           PT Long Term Goals - 03/25/15 1112    PT LONG TERM GOAL #1   Title I in advanced HEP   Time 8   Period Weeks   Status New   PT LONG TERM GOAL #2   Title Increase ROM by 10 degrees Lt UE    Time 8   Period Weeks   Status New   PT LONG TERM GOAL #3   Title Inprove functional strength Lt UE allowing patient to lift, carry and move objects in her home weighing 5-15 pounds   Time 10   Period Weeks   Status New   PT LONG TERM GOAL #4   Title Decrease FOTO score to =/< 36%; to CJ  level   Time 12   Period Weeks   Status New             Plan - 03/25/15 1048    Clinical Impression Statement Patient is an 81 year old female who presents with recurrent Lt shoulder pain, recently treated  with injections with improvement. She has decreased AROM bilat shoulders Lt.>Rt and cervical spine;poor posture and alignment; pain with functional activities; inability to lift, carry, move objects withou pain and difficulty.   Pt will benefit from skilled therapeutic intervention in order to improve on the following deficits Decreased range of motion;Decreased activity tolerance;Pain;Decreased strength;Postural dysfunction   Rehab Potential Good   PT Frequency 2x / week   PT Duration 12 weeks   PT Treatment/Interventions ADLs/Self Care Home Management;Cryotherapy;Electrical Stimulation;Moist Heat;Ultrasound;Therapeutic activities;Therapeutic exercise;Neuromuscular re-education;Patient/family education;Manual techniques;Passive range of motion   PT Next Visit Plan Review exercises; progress with ROM activities for bilater shoulders - pulley stretch; add posterior shoulder girdle strengthening as tolerated; manual therapy pecs, c-spine, traps; continue postural education and correction    PT Home Exercise Plan HEP - progress with stretching pecs; strengtheing posterior shoulder girdle musculature   Consulted and Agree with Plan of Care Patient         Problem List Patient Active Problem List   Diagnosis Date Noted  . Shoulder pain, left 03/16/2015  . Degenerative disc disease, cervical 03/16/2015  . Insomnia 03/09/2015  . Hyperlipidemia 02/03/2015  . Essential hypertension 02/03/2015  . DDD (degenerative disc disease), lumbar 02/03/2015  . GERD (gastroesophageal reflux disease) 02/03/2015  . Anxiety state 02/03/2015  . Fibrocystic breast changes of both breasts 02/03/2015  . Microscopic hematuria 02/03/2015  . Allergic bronchitis 02/03/2015    Chasiti Waddington Nilda Simmer, PT,  MPH 03/25/2015, 11:25 AM  [facility] [place], [place], [address] Phone: [phone]   Fax:  [phone]

## 2015-03-25 NOTE — Patient Instructions (Signed)
Scapular Retraction (Standing)   With arms at sides, pinch shoulder blades together. Can do with swim noodle between shoulder blades. Repeat 10___ times per set. Do _1-2___ sets per session. Do _several ___ sessions per day.  http://orth.exer.us/944   Copyright  VHI. All rights reserved.  Flexibility: Neck Retraction   Pull head straight back, keeping eyes and jaw level. Repeat _10___ times per set. Do ___1_ sets per session. Do _3-4___ sessions per day.  http://orth.exer.us/344   Copyright  VHI. All rights reserved.  Flexibility: Corner Stretch   Standing in corner with hands just above shoulder level and one foot forward into the corner, lean forward with hips until a comfortable stretch is felt across chest. Hold _20-30___ seconds. Repeat __3__ times per set. Do _1___ sets per session. Do _3-4___ sessions per day.  http://orth.exer.us/343   Copyright  VHI. All rights reserved.

## 2015-03-29 ENCOUNTER — Encounter (INDEPENDENT_AMBULATORY_CARE_PROVIDER_SITE_OTHER): Payer: Self-pay

## 2015-03-29 ENCOUNTER — Ambulatory Visit (INDEPENDENT_AMBULATORY_CARE_PROVIDER_SITE_OTHER): Payer: Medicare Other | Admitting: Rehabilitative and Restorative Service Providers"

## 2015-03-29 DIAGNOSIS — M542 Cervicalgia: Secondary | ICD-10-CM

## 2015-03-29 DIAGNOSIS — M25512 Pain in left shoulder: Secondary | ICD-10-CM | POA: Diagnosis not present

## 2015-03-29 DIAGNOSIS — R531 Weakness: Secondary | ICD-10-CM

## 2015-03-29 DIAGNOSIS — M25612 Stiffness of left shoulder, not elsewhere classified: Secondary | ICD-10-CM

## 2015-03-29 NOTE — Patient Instructions (Signed)
Scapula Adduction With Pectorals, Low   Stand in doorframe with palms against frame and arms at 45. Lean forward and squeeze shoulder blades. Hold _20-30__ seconds. Repeat __3_ times per session. Do _3-4__ sessions per day.  Copyright  VHI. All rights reserved.    Scapula Adduction With Pectorals, Mid-Range   Stand in doorframe with palms against frame and arms at 90. Lean forward and squeeze shoulder blades. Hold 20-30__ seconds. Repeat __3_ times per session. Do _3-4__ sessions per day. \Scapula Adduction With Pectorals, High   Stand in doorframe with palms against frame and arms at 120. Lean forward and squeeze shoulder blades. Hold 20-30__ seconds. Repeat __3_ times per session. Do _3-4__ sessions per day.  Resisted External Rotation: in Neutral - Bilateral  PALMS UP!!! Sit or stand, tubing in both hands, elbows at sides, bent to 90, forearms forward. Pinch shoulder blades together and rotate forearms out. Keep elbows at sides. Repeat __10__ times per set. Do __2-3__ sets per session. Do __3-4__ sessions per day.  Resistive Band Rowing   With resistive band anchored in door, grasp both ends. Keeping elbows bent, pull back, squeezing shoulder blades together. Hold _3-5___ seconds. Repeat _10-30___ times. Do __1__ sessions per day. 1 http://gt2.exer.us/98   Copyright  VHI. All rights reserved.   Strengthening: Resisted Extension   Hold tubing with both hands, arms forward. Pull arms back, elbow straight. Repeat _10-30___ times per set. Do ____ sets per session. Do _1___ sessions per day.  [facility] [place], [place] [address]  [phone] (office) [phone] (fax)

## 2015-03-29 NOTE — Therapy (Signed)
[place], [place], [address] Phone: [phone]   Fax:  [phone]  Physical Therapy Treatment  Patient Details  Name: Belinda Owens MRN: [id number] Date of Birth: 22-Apr-1932 Referring Provider:  Silverio Decamp,*  Encounter Date: 03/29/2015      PT End of Session - 03/29/15 0932    Visit Number 2   Number of Visits 24   Date for PT Re-Evaluation 06/18/15   PT Start Time 0933   PT Stop Time 1031   PT Time Calculation (min) 58 min   Activity Tolerance Patient tolerated treatment well;No increased pain      Past Medical History  Diagnosis Date  . Fibrocystic breast changes of both breasts     Gets yearly mammo and [place]  . History of rheumatic fever     Past Surgical History  Procedure Laterality Date  . Breast biopsy    . Meniscus repair Right   . Rotator cuff repair Right     There were no vitals filed for this visit.  Visit Diagnosis:  Pain in joint, shoulder region, left  Stiffness of shoulder joint, left  Weakness generalized  Cervical pain (neck)      Subjective Assessment - 03/29/15 0938    Subjective Shoulder feels OK. She has been working on her exercises at home and trying to correct her posture. Can feel a difference already when sitting in her car. Bought a swim noodle to use at home.   Pertinent History History of Lt shoulder pain 12/15. RCR Rt shoulder 16 years ago. HTN on medication. DDD cervical spine. LBP. Torn meniscus surgery Rt knee 9 years ago, Lt no surgery. Bursitis in Rt hip.   How long can you sit comfortably? 2-3 hours   How long can you stand comfortably? 1 hour   How long can you walk comfortably? 1 hour   Diagnostic tests X-rays   Patient Stated Goals To be able to use arms normally. Keep pain from returning.   Currently in Pain? No/denies            [facility] Adult PT Treatment/Exercise - 03/29/15 0001    Shoulder Exercises: Seated   Other  Seated Exercises chin tuck/axial extension 5 reps 5 sec hold   Shoulder Exercises: Standing   External Rotation Strengthening;Both;10 reps;Theraband  scapular retraction 2 sets of 10reps   Theraband Level (Shoulder Extension) Level 1 (Yellow)  bilat shd extension 10 reps 2 sets   Retraction Both;10 reps  using foam roll at spine for tactile cue   Other Standing Exercises cervical retraction 5 sec hold x10 reps   Other Standing Exercises TB rowing yellow TB 10 reps 2 sets   Shoulder Exercises: Stretch   Other Shoulder Stretches doorway stretch 3  positions 20 sec hold    Modalities   Modalities Cryotherapy   Cryotherapy   Number Minutes Cryotherapy 12 Minutes   Cryotherapy Location Cervical   Type of Cryotherapy Ice pack   Manual Therapy   Manual Therapy Soft tissue mobilization;Myofascial release   Manual therapy comments tightness noted through pecs and cervical musculature   Soft tissue mobilization pecs/cervical/trap areas   Myofascial Release cervical musculature            PT Education - 03/29/15 1030    Education provided Yes   Person(s) Educated Patient   Methods Explanation;Demonstration;Tactile cues;Verbal cues;Handout   Comprehension Verbalized understanding;Returned demonstration;Verbal cues required;Tactile cues required;Need further instruction  PT Short Term Goals - 03/29/15 1033    PT SHORT TERM GOAL #1   Title I in initial HEP   Time 3   Period Weeks   Status On-going   PT SHORT TERM GOAL #2   Title Improve shoulder ROM by 5-10 degrees   Time 3   Period Weeks   Status On-going   PT SHORT TERM GOAL #3   Title increase thoracic extension, improve posture and alignment   Time 4   Period Weeks   Status On-going           PT Long Term Goals - 03/29/15 1034    PT LONG TERM GOAL #1   Title I in advanced HEP   Time 8   Period Weeks   Status On-going   PT LONG TERM GOAL #2   Title Increase ROM by 10 degrees Lt UE    Time 8   Period  Weeks   Status On-going   PT LONG TERM GOAL #3   Title Inprove functional strength Lt UE allowing patient to lift, carry and move objects in her home weighing 5-15 pounds   Time 10   Period Weeks   Status On-going   PT LONG TERM GOAL #4   Title Decrease FOTO score to =/< 36%; to CJ level   Time 12   Period Weeks   Status On-going             Plan - 03/29/15 1030    Clinical Impression Statement Started exercises without difficulty. Reports that she can tell a difference in her posture already. Note improved demo of exercises. Added doorway stretch and posterior shoulder girdle strengthening exercises without difficulty. Needs to continue work to improve posture and alignment and progress posterior girdle strengthening exercises as indicated.    Pt will benefit from skilled therapeutic intervention in order to improve on the following deficits Decreased range of motion;Decreased activity tolerance;Pain;Decreased strength;Postural dysfunction   Rehab Potential Good   PT Frequency 2x / week   PT Duration 12 weeks   PT Treatment/Interventions ADLs/Self Care Home Management;Cryotherapy;Electrical Stimulation;Moist Heat;Ultrasound;Therapeutic activities;Therapeutic exercise;Neuromuscular re-education;Patient/family education;Manual techniques;Passive range of motion   PT Next Visit Plan Review exercises; progress with ROM activities for bilater shoulders - pulley stretch; add posterior shoulder girdle strengthening as tolerated; manual therapy pecs, c-spine, traps; continue postural education and correction    PT Home Exercise Plan HEP - progress with stretching pecs; strengtheing posterior shoulder girdle musculature   Consulted and Agree with Plan of Care Patient      Problem List Patient Active Problem List   Diagnosis Date Noted  . Shoulder pain, left 03/16/2015  . Degenerative disc disease, cervical 03/16/2015  . Insomnia 03/09/2015  . Hyperlipidemia 02/03/2015  . Essential  hypertension 02/03/2015  . DDD (degenerative disc disease), lumbar 02/03/2015  . GERD (gastroesophageal reflux disease) 02/03/2015  . Anxiety state 02/03/2015  . Fibrocystic breast changes of both breasts 02/03/2015  . Microscopic hematuria 02/03/2015  . Allergic bronchitis 02/03/2015    Miraj Truss Nilda Simmer, PT, MPH 03/29/2015, 10:47 AM  [facility] [place], [place], [address] Phone: [phone]   Fax:  [phone]

## 2015-04-01 ENCOUNTER — Ambulatory Visit (INDEPENDENT_AMBULATORY_CARE_PROVIDER_SITE_OTHER): Payer: Medicare Other | Admitting: Rehabilitative and Restorative Service Providers"

## 2015-04-01 DIAGNOSIS — M542 Cervicalgia: Secondary | ICD-10-CM | POA: Diagnosis not present

## 2015-04-01 DIAGNOSIS — M25612 Stiffness of left shoulder, not elsewhere classified: Secondary | ICD-10-CM

## 2015-04-01 DIAGNOSIS — R531 Weakness: Secondary | ICD-10-CM | POA: Diagnosis not present

## 2015-04-01 DIAGNOSIS — M25512 Pain in left shoulder: Secondary | ICD-10-CM

## 2015-04-01 NOTE — Patient Instructions (Signed)
  Scapular Retraction (Standing)   With arms at sides, pinch shoulder blades together. Repeat _10_ times per set.  Do several sessions per day.  Can squeeze around swim noodle or just in standing or sitting.

## 2015-04-01 NOTE — Therapy (Signed)
[place], [place], [address] Phone: [phone]   Fax:  [phone]  Physical Therapy Treatment  Patient Details  Name: Belinda Owens MRN: [id number] Date of Birth: Aug 20, 1932 Referring Provider:  Silverio Decamp,*  Encounter Date: 04/01/2015      PT End of Session - 04/01/15 0926    Visit Number 3   Number of Visits 24   Date for PT Re-Evaluation 06/18/15   PT Start Time 0926   PT Stop Time 1021   PT Time Calculation (min) 55 min   Activity Tolerance Patient tolerated treatment well;No increased pain      Past Medical History  Diagnosis Date  . Fibrocystic breast changes of both breasts     Gets yearly mammo and [place]  . History of rheumatic fever     Past Surgical History  Procedure Laterality Date  . Breast biopsy    . Meniscus repair Right   . Rotator cuff repair Right     There were no vitals filed for this visit.  Visit Diagnosis:  Pain in joint, shoulder region, left  Stiffness of shoulder joint, left  Weakness generalized  Cervical pain (neck)      Subjective Assessment - 04/01/15 0927    Subjective Shoulder continues to improve. Exercises are going well at home. Neck feels better as well. She can turn her neck farther to the Lt and Rt without it hurting   Pertinent History History of Lt shoulder pain 12/15. RCR Rt shoulder 16 years ago. HTN on medication. DDD cervical spine. LBP. Torn meniscus surgery Rt knee 9 years ago, Lt no surgery. Bursitis in Rt hip.   How long can you sit comfortably? 2-3 hours   How long can you stand comfortably? 1 hour   How long can you walk comfortably? 1 hour   Diagnostic tests X-rays            [facility] Adult PT Treatment/Exercise - 04/01/15 0001    Exercises   Exercises --  UBE - L1 1 min/75min forward/back 4 min total   Shoulder Exercises: Supine   Other Supine Exercises axial extensioin 10 reps 5 reps   Shoulder Exercises:  Seated   Other Seated Exercises chin tuck/axial extension 5 reps 5 sec hold   Shoulder Exercises: Standing   External Rotation Strengthening;Both;10 reps;Theraband  scapular retraction 2 sets of 10reps   Theraband Level (Shoulder Extension) Level 1 (Yellow)  bilat shd extension 10 reps 2 sets   Retraction Both;10 reps  using foam roll at spine for tactile cue   Other Standing Exercises cervical retraction 5 sec hold x10 reps   Other Standing Exercises TB rowing yellow TB 10 reps 2 sets   Shoulder Exercises: Pulleys   Flexion --  Stretch - 10 sec hold 5 reps ea UE   ABduction --  Stretch 10 sec hold 5 reps ea UE   Shoulder Exercises: Stretch   Other Shoulder Stretches doorway stretch 3  positions 20 sec hold    Cryotherapy   Number Minutes Cryotherapy 12 Minutes   Cryotherapy Location Cervical   Type of Cryotherapy Ice pack   Manual Therapy   Manual Therapy Soft tissue mobilization;Myofascial release   Manual therapy comments tightness noted through pecs and cervical musculature   Soft tissue mobilization pecs/cervical/trap areas   Myofascial Release cervical musculature           PT Education - 04/01/15 1018    Education provided Yes  Education Details Continued postural education, discussing the improtnceof posture in standing and with balance as well as activities with head, neck and shoulders. Reviewed all exercises. focused on scapular retraction and axial extension.   Person(s) Educated Patient   Methods Explanation;Demonstration;Tactile cues;Verbal cues;Handout   Comprehension Verbalized understanding;Returned demonstration;Verbal cues required;Tactile cues required;Need further instruction          PT Short Term Goals - 03/29/15 1033    PT SHORT TERM GOAL #1   Title I in initial HEP   Time 3   Period Weeks   Status On-going   PT SHORT TERM GOAL #2   Title Improve shoulder ROM by 5-10 degrees   Time 3   Period Weeks   Status On-going   PT SHORT TERM GOAL  #3   Title increase thoracic extension, improve posture and alignment   Time 4   Period Weeks   Status On-going           PT Long Term Goals - 03/29/15 1034    PT LONG TERM GOAL #1   Title I in advanced HEP   Time 8   Period Weeks   Status On-going   PT LONG TERM GOAL #2   Title Increase ROM by 10 degrees Lt UE    Time 8   Period Weeks   Status On-going   PT LONG TERM GOAL #3   Title Inprove functional strength Lt UE allowing patient to lift, carry and move objects in her home weighing 5-15 pounds   Time 10   Period Weeks   Status On-going   PT LONG TERM GOAL #4   Title Decrease FOTO score to =/< 36%; to CJ level   Time 12   Period Weeks   Status On-going          Problem List Patient Active Problem List   Diagnosis Date Noted  . Shoulder pain, left 03/16/2015  . Degenerative disc disease, cervical 03/16/2015  . Insomnia 03/09/2015  . Hyperlipidemia 02/03/2015  . Essential hypertension 02/03/2015  . DDD (degenerative disc disease), lumbar 02/03/2015  . GERD (gastroesophageal reflux disease) 02/03/2015  . Anxiety state 02/03/2015  . Fibrocystic breast changes of both breasts 02/03/2015  . Microscopic hematuria 02/03/2015  . Allergic bronchitis 02/03/2015    Tiran Sauseda Nilda Simmer, PT, MPH 04/01/2015, 10:22 AM  [facility] [place], [place], [address] Phone: [phone]   Fax:  [phone]

## 2015-04-02 DIAGNOSIS — Z13810 Encounter for screening for upper gastrointestinal disorder: Secondary | ICD-10-CM | POA: Diagnosis not present

## 2015-04-02 DIAGNOSIS — D131 Benign neoplasm of stomach: Secondary | ICD-10-CM | POA: Diagnosis not present

## 2015-04-02 DIAGNOSIS — E780 Pure hypercholesterolemia: Secondary | ICD-10-CM | POA: Diagnosis not present

## 2015-04-02 DIAGNOSIS — Z87891 Personal history of nicotine dependence: Secondary | ICD-10-CM | POA: Diagnosis not present

## 2015-04-02 DIAGNOSIS — K219 Gastro-esophageal reflux disease without esophagitis: Secondary | ICD-10-CM | POA: Diagnosis not present

## 2015-04-02 DIAGNOSIS — Z8719 Personal history of other diseases of the digestive system: Secondary | ICD-10-CM | POA: Diagnosis not present

## 2015-04-02 DIAGNOSIS — Z885 Allergy status to narcotic agent status: Secondary | ICD-10-CM | POA: Diagnosis not present

## 2015-04-02 DIAGNOSIS — Z7982 Long term (current) use of aspirin: Secondary | ICD-10-CM | POA: Diagnosis not present

## 2015-04-02 DIAGNOSIS — K589 Irritable bowel syndrome without diarrhea: Secondary | ICD-10-CM | POA: Diagnosis not present

## 2015-04-02 DIAGNOSIS — I10 Essential (primary) hypertension: Secondary | ICD-10-CM | POA: Diagnosis not present

## 2015-04-02 DIAGNOSIS — J45909 Unspecified asthma, uncomplicated: Secondary | ICD-10-CM | POA: Diagnosis not present

## 2015-04-05 ENCOUNTER — Encounter: Payer: Medicare Other | Admitting: Rehabilitative and Restorative Service Providers"

## 2015-04-08 ENCOUNTER — Ambulatory Visit (INDEPENDENT_AMBULATORY_CARE_PROVIDER_SITE_OTHER): Payer: Medicare Other | Admitting: Rehabilitative and Restorative Service Providers"

## 2015-04-08 ENCOUNTER — Encounter: Payer: Self-pay | Admitting: Rehabilitative and Restorative Service Providers"

## 2015-04-08 DIAGNOSIS — R531 Weakness: Secondary | ICD-10-CM | POA: Diagnosis not present

## 2015-04-08 DIAGNOSIS — M25512 Pain in left shoulder: Secondary | ICD-10-CM

## 2015-04-08 DIAGNOSIS — M25612 Stiffness of left shoulder, not elsewhere classified: Secondary | ICD-10-CM | POA: Diagnosis not present

## 2015-04-08 DIAGNOSIS — M542 Cervicalgia: Secondary | ICD-10-CM

## 2015-04-08 NOTE — Patient Instructions (Signed)
Row: Mid-Range - Standing   With yellow band anchored at chest level, elbows up away from body about 45 degrees, pull elbows backward, squeezing shoulder blades together. Keep head and spine neutral. Row _10__ times, 2-3 sets, _2__ times per day.

## 2015-04-08 NOTE — Therapy (Signed)
[place], [place], [address] Phone: [phone]   Fax:  [phone]  Physical Therapy Treatment  Patient Details  Name: Belinda Owens MRN: [id number] Date of Birth: 1932/07/08 Referring Provider:  Silverio Decamp,*  Encounter Date: 04/08/2015      PT End of Session - 04/08/15 1002    Visit Number 4   Number of Visits 24   Date for PT Re-Evaluation 06/18/15   PT Start Time 1003   PT Stop Time 1059   PT Time Calculation (min) 56 min   Activity Tolerance Patient tolerated treatment well;No increased pain      Past Medical History  Diagnosis Date  . Fibrocystic breast changes of both breasts     Gets yearly mammo and [place]  . History of rheumatic fever     Past Surgical History  Procedure Laterality Date  . Breast biopsy    . Meniscus repair Right   . Rotator cuff repair Right     There were no vitals filed for this visit.  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 Shoulder is doing better and better. Doing exercises at home. Using L UE for more functional activities including light lifting.   Pertinent History History of Lt shoulder pain 12/15. RCR Rt shoulder 16 years ago. HTN on medication. DDD cervical spine. LBP. Torn meniscus surgery Rt knee 9 years ago, Lt no surgery. Bursitis in Rt hip.   How long can you sit comfortably? 2-3 hours   How long can you stand comfortably? 1 hour   How long can you walk comfortably? 1 hour   Diagnostic tests X-rays   Patient Stated Goals To be able to use arms normally. Keep pain from returning.   Currently in Pain? No/denies            [facility] PT Assessment - 04/08/15 0001    AROM   Left Shoulder Extension 48  Degrees   Left Shoulder Flexion 130 Degrees   Left Shoulder ABduction 132 Degrees   Left Shoulder Internal Rotation 40 Degrees   Left Shoulder External Rotation 80 Degrees           [facility] Adult PT Treatment/Exercise - 04/08/15 0001    Shoulder Exercises: Standing   External Rotation Strengthening;Both;10 reps;Theraband  scapular retraction 2 sets of 10reps   Theraband Level (Shoulder Row) Level 1 (Yellow)   Row Weight (lbs) Row at 45 degrees abduction 10 reps x2 sets   Retraction Both;10 reps  using foam roll at spine for tactile cue   Other Standing Exercises cervical retraction 5 sec hold x10 reps   Other Standing Exercises TB rowing yellow TB 10 reps 2 sets   Shoulder Exercises: Therapy Ball   Flexion --  Rolling small ball on wall 1 min LT   Shoulder Exercises: ROM/Strengthening   UBE (Upper Arm Bike) L2 1 " forward/1" back x2   Shoulder Exercises: Stretch   Other Shoulder Stretches doorway stretch 3  positions 20 sec hold    Shoulder Exercises: Body Blade   Flexion 60 seconds;2 reps           PT Education - 04/08/15 1028    Education provided Yes   Education Details Reviewed all exercises;  added rowing at 45 degrees; encouraged consistent HE{   Person(s) Educated Patient   Methods Explanation;Demonstration;Tactile cues;Verbal cues;Handout   Comprehension Verbalized understanding;Returned demonstration;Verbal cues required;Tactile cues required          PT Short Term Goals - 04/08/15 1058    PT SHORT TERM GOAL #1   Title I in initial HEP   Time 3   Period Weeks   Status Achieved   PT SHORT TERM GOAL #2   Title Improve shoulder ROM by 5-10 degrees   Period Weeks   Status Partially Met   PT SHORT TERM GOAL #3   Title increase thoracic extension, improve posture and alignment   Time 4   Period Weeks   Status Partially Met           PT Long Term Goals - 04/08/15 1059    PT LONG TERM GOAL #1   Title I in advanced HEP   Time 8   Period Weeks    Status On-going   PT LONG TERM GOAL #2   Title Increase ROM by 10 degrees Lt UE    Time 8   Period Weeks   Status Partially Met   PT LONG TERM GOAL #3   Title Inprove functional strength Lt UE allowing patient to lift, carry and move objects in her home weighing 5-15 pounds   Time 10   Period Weeks   Status Partially Met   PT LONG TERM GOAL #4   Title Decrease FOTO score to =/< 36%; to CJ level   Time 12   Period Weeks   Status On-going           Plan - 04/08/15 1054    Clinical Impression Statement Patinet reports that she fell this past weekend but did not get hurt. She has improved shoulder ROM and reports decreased pain and improved function. She returns to [place] next week after therapy.   Pt will benefit from skilled therapeutic intervention in order to improve on the following deficits Decreased range of motion;Decreased activity tolerance;Pain;Decreased strength;Postural dysfunction   Rehab Potential Good   PT Frequency 2x / week   PT Duration 12 weeks   PT Treatment/Interventions ADLs/Self Care Home Management;Cryotherapy;Electrical Stimulation;Moist Heat;Ultrasound;Therapeutic activities;Therapeutic exercise;Neuromuscular re-education;Patient/family education;Manual techniques;Passive range of motion   PT Next Visit Plan Review exercises; progress with ROM Lt shoulder, continue working on cervical posture, re-assess for MD progress report.   PT Home Exercise Plan HEP added row at 45 degrees; to continue with HEP and postural correction   Consulted and Agree with Plan of Care Patient        Problem List Patient Active Problem List   Diagnosis Date Noted  . Shoulder pain, left 03/16/2015  . Degenerative disc disease, cervical 03/16/2015  . Insomnia 03/09/2015  . Hyperlipidemia 02/03/2015  . Essential hypertension 02/03/2015  . DDD (degenerative disc disease), lumbar 02/03/2015  . GERD (gastroesophageal reflux disease) 02/03/2015  . Anxiety state 02/03/2015  .  Fibrocystic breast changes of both breasts 02/03/2015  . Microscopic hematuria 02/03/2015  . Allergic bronchitis 02/03/2015    Kaelum Kissick Nilda Simmer, PT, MPH 04/08/2015, 11:02 AM  [facility] [place], [place], [address] Phone: [phone]   Fax:  [phone]

## 2015-04-12 ENCOUNTER — Encounter: Payer: Medicare Other | Admitting: Rehabilitative and Restorative Service Providers"

## 2015-04-13 ENCOUNTER — Encounter: Payer: Self-pay | Admitting: Rehabilitative and Restorative Service Providers"

## 2015-04-13 ENCOUNTER — Ambulatory Visit (INDEPENDENT_AMBULATORY_CARE_PROVIDER_SITE_OTHER): Payer: Medicare Other | Admitting: Rehabilitative and Restorative Service Providers"

## 2015-04-13 ENCOUNTER — Ambulatory Visit (INDEPENDENT_AMBULATORY_CARE_PROVIDER_SITE_OTHER): Payer: Medicare Other | Admitting: Sports Medicine

## 2015-04-13 ENCOUNTER — Encounter: Payer: Self-pay | Admitting: Sports Medicine

## 2015-04-13 VITALS — BP 111/70 | HR 49 | Ht 62.0 in | Wt 162.0 lb

## 2015-04-13 DIAGNOSIS — M25612 Stiffness of left shoulder, not elsewhere classified: Secondary | ICD-10-CM

## 2015-04-13 DIAGNOSIS — M25512 Pain in left shoulder: Secondary | ICD-10-CM

## 2015-04-13 DIAGNOSIS — M542 Cervicalgia: Secondary | ICD-10-CM | POA: Diagnosis not present

## 2015-04-13 DIAGNOSIS — M5030 Other cervical disc degeneration, unspecified cervical region: Secondary | ICD-10-CM | POA: Diagnosis not present

## 2015-04-13 DIAGNOSIS — R531 Weakness: Secondary | ICD-10-CM | POA: Diagnosis not present

## 2015-04-13 NOTE — Progress Notes (Signed)
  Subjective:    CC: follow-up  HPI: Left shoulder pain: Completely resolved after physical therapy, glenohumeral and subacromial injections  Left cervical radiculopathy: Left C7, completely resolved after physical therapy.  Past medical history, Surgical history, Family history not pertinant except as noted below, Social history, Allergies, and medications have been entered into the medical record, reviewed, and no changes needed.   Review of Systems: No fevers, chills, night sweats, weight loss, chest pain, or shortness of breath.   Objective:    General: Well Developed, well nourished, and in no acute distress.  Neuro: Alert and oriented x3, extra-ocular muscles intact, sensation grossly intact.  HEENT: Normocephalic, atraumatic, pupils equal round reactive to light, neck supple, no masses, no lymphadenopathy, thyroid nonpalpable.  Skin: Warm and dry, no rashes. Cardiac: Regular rate and rhythm, no murmurs rubs or gallops, no lower extremity edema.  Respiratory: Clear to auscultation bilaterally. Not using accessory muscles, speaking in full sentences. Left Shoulder: Inspection reveals no abnormalities, atrophy or asymmetry. Palpation is normal with no tenderness over AC joint or bicipital groove. ROM is full in all planes. Rotator cuff strength normal throughout. No signs of impingement with negative Neer and Hawkin's tests, empty can. Speeds and Yergason's tests normal. No labral pathology noted with negative Obrien's, negative crank, negative clunk, and good stability. Normal scapular function observed. No painful arc and no drop arm sign. No apprehension sign  Impression and Recommendations:

## 2015-04-13 NOTE — Assessment & Plan Note (Signed)
Completely resolved post glenohumeral and subacromial injections as well as formal physical therapy. Return as needed.

## 2015-04-13 NOTE — Assessment & Plan Note (Signed)
All previous left-sided C7 radicular symptoms as well as neck pain have resolved with formal physical therapy. Return as needed, next step would be MRI for interventional planning.

## 2015-04-13 NOTE — Patient Instructions (Signed)
Reviewed HEP  Encouraged consistent HEP

## 2015-04-13 NOTE — Therapy (Addendum)
[place], [place], [address] Phone: [phone]   Fax:  [phone]  Physical Therapy Treatment  Patient Details  Name: Belinda Owens MRN: [id number] Date of Birth: 08-11-1932 Referring Provider:  Silverio Decamp,*  Encounter Date: 04/13/2015      PT End of Session - 04/13/15 0942    Visit Number 5   Number of Visits 24   Date for PT Re-Evaluation 06/18/15   PT Start Time 0850   PT Stop Time 0943   PT Time Calculation (min) 53 min   Activity Tolerance Patient tolerated treatment well;No increased pain      Past Medical History  Diagnosis Date  . Fibrocystic breast changes of both breasts     Gets yearly mammo and [place]  . History of rheumatic fever     Past Surgical History  Procedure Laterality Date  . Breast biopsy    . Meniscus repair Right   . Rotator cuff repair Right     There were no vitals filed for this visit.  Visit Diagnosis:  Pain in joint, shoulder region, left  Stiffness of shoulder joint, left  Weakness generalized  Cervical pain (neck)      Subjective Assessment - 04/13/15 0902    Subjective Belinda Owens reports that her shoulder and neck are feeling good  She understands that she needs to continue with her HEP   Pertinent History History of Lt shoulder pain 12/15. RCR Rt shoulder 16 years ago. HTN on medication. DDD cervical spine. LBP. Torn meniscus surgery Rt knee 9 years ago, Lt no surgery. Bursitis in Rt hip.   How long can you sit comfortably? 2-3 hours   How long can you stand comfortably? 1 hour   How long can you walk comfortably? 1 hour   Diagnostic tests X-rays   Patient Stated Goals To be able to use arms normally. Keep pain from returning.   Currently in Pain? No/denies           [facility] PT Assessment - 04/13/15 0001    Observation/Other Assessments   Focus on Therapeutic Outcomes (FOTO)  19% limitation   AROM   Left Shoulder Extension 62  Degrees   Left Shoulder Flexion 140 Degrees   Left Shoulder ABduction 144 Degrees   Left Shoulder Internal Rotation 40 Degrees   Left Shoulder External Rotation 85 Degrees   Strength   Overall Strength Within functional limits for tasks performed   Flexibility   Soft Tissue Assessment /Muscle Length --  some tightness Lt lateral cervical/pecs musculature           [facility] Adult PT Treatment/Exercise - 04/13/15 0001    Exercises   Exercises Shoulder   Shoulder Exercises: Seated   Other Seated Exercises chin tuck 10 sec hold 10 reps   Shoulder Exercises: Standing   External Rotation Strengthening;Both;10 reps;Theraband  scapular retraction 2 sets of 10reps   Theraband Level (Shoulder Row) Level 1 (Yellow)   Row Weight (lbs) Row at 45 degrees abduction 10 reps x2 sets   Retraction Both;10 reps  using foam roll at spine for tactile cue   Other Standing Exercises cervical retraction 5 sec hold x10 reps   Other Standing Exercises TB rowing yellow TB 10 reps 2 sets   Shoulder Exercises: Therapy Ball   Flexion --  Rolling small ball on wall 1 min LT   Shoulder Exercises: ROM/Strengthening   UBE (Upper Arm Bike) L2 1 " forward/1" back x2  Shoulder Exercises: Stretch   Other Shoulder Stretches doorway stretch 3  positions 20 sec hold    Shoulder Exercises: Body Blade   Flexion 60 seconds;2 reps   Cryotherapy   Number Minutes Cryotherapy 12 Minutes   Cryotherapy Location Cervical   Type of Cryotherapy Ice pack   Manual Therapy   Manual Therapy Soft tissue mobilization;Myofascial release   Manual therapy comments tightness noted through pecs and cervical musculature   Soft tissue mobilization pecs/cervical/trap areas   Myofascial Release cervical musculature           PT Education - 04/13/15 0941    Education provided Yes   Education Details Reviewed importance of HEP and added supine snow angel exercise    Person(s) Educated Patient   Methods  Explanation;Demonstration;Tactile cues;Verbal cues;Handout   Comprehension Verbalized understanding;Returned demonstration;Verbal cues required;Tactile cues required          PT Short Term Goals - 04/13/15 0950    PT SHORT TERM GOAL #1   Title I in initial HEP   Time 3   Period Weeks   Status Achieved   PT SHORT TERM GOAL #2   Title Improve shoulder ROM by 5-10 degrees   Time 3   Period Weeks   Status Achieved   PT SHORT TERM GOAL #3   Title increase thoracic extension, improve posture and alignment   Period Weeks   Status Achieved           PT Long Term Goals - 04/13/15 0950    PT LONG TERM GOAL #1   Title I in advanced HEP   Time 8   Period Weeks   Status Achieved   PT LONG TERM GOAL #2   Title Increase ROM by 10 degrees Lt UE    Time 8   Period Weeks   Status Achieved   PT LONG TERM GOAL #3   Title Inprove functional strength Lt UE allowing patient to lift, carry and move objects in her home weighing 5-15 pounds   Time 10   Period Weeks   Status Achieved   PT LONG TERM GOAL #4   Title Decrease FOTO score to =/< 36%; to CJ level   Time 12   Period Weeks   Status Achieved              Plan - 04/13/15 0944    Clinical Impression Statement Patient reports that she feels confident with continuing her HEP. She is not having pain and has resumed her normal functional activities.   Pt will benefit from skilled therapeutic intervention in order to improve on the following deficits Decreased range of motion;Decreased activity tolerance;Pain;Decreased strength;Postural dysfunction   Rehab Potential Good   PT Frequency 2x / week   PT Duration 12 weeks   PT Treatment/Interventions ADLs/Self Care Home Management;Cryotherapy;Electrical Stimulation;Moist Heat;Ultrasound;Therapeutic activities;Therapeutic exercise;Neuromuscular re-education;Patient/family education;Manual techniques;Passive range of motion   PT Next Visit Plan D/C to I HEP    PT Home Exercise Plan  To continue I home program. Lyndi will call with any questions or problems    Consulted and Agree with Plan of Care Patient       Problem List Patient Active Problem List   Diagnosis Date Noted  . Shoulder pain, left 03/16/2015  . Degenerative disc disease, cervical 03/16/2015  . Insomnia 03/09/2015  . Hyperlipidemia 02/03/2015  . Essential hypertension 02/03/2015  . DDD (degenerative disc disease), lumbar 02/03/2015  . GERD (gastroesophageal reflux disease) 02/03/2015  . Anxiety state 02/03/2015  .  Fibrocystic breast changes of both breasts 02/03/2015  . Microscopic hematuria 02/03/2015  . Allergic bronchitis 02/03/2015    Husain Costabile Nilda Simmer, PT, [facility] 04/13/2015, 10:03 AM  [facility] [place], [place], [address] Phone: [phone]   Fax:  [phone]   PHYSICAL THERAPY DISCHARGE SUMMARY  Visits from Start of Care: 5  Current functional level related to goals / functional outcomes: Pain free, full function, I in HEP   Remaining deficits: End range shoulder and cervical spine limitations consistent with age and health    Education / Equipment: HEP  Plan: Patient agrees to discharge.  Patient goals were met. Patient is being discharged due to meeting the stated rehab goals.  ?????   Sherman Lipuma P. Helene Kelp, PT, MPH 04/13/15 10:00 a

## 2015-04-15 ENCOUNTER — Telehealth: Payer: Self-pay | Admitting: *Deleted

## 2015-04-15 ENCOUNTER — Encounter: Payer: Medicare Other | Admitting: Rehabilitative and Restorative Service Providers"

## 2015-04-15 NOTE — Telephone Encounter (Signed)
Pt called and lvm wanting to know if Dr. Madilyn Fireman would send in a Zpack for a sinus infection. I returned pt's called and lvm informing her that she would need to schedule an appt to be seen for this.Belinda Owens

## 2015-04-23 ENCOUNTER — Ambulatory Visit: Payer: Medicare Other | Admitting: Family Medicine

## 2015-04-27 ENCOUNTER — Ambulatory Visit (INDEPENDENT_AMBULATORY_CARE_PROVIDER_SITE_OTHER): Payer: Medicare Other | Admitting: Family Medicine

## 2015-04-27 ENCOUNTER — Encounter: Payer: Self-pay | Admitting: Family Medicine

## 2015-04-27 VITALS — BP 124/75 | HR 47 | Ht 62.0 in | Wt 162.0 lb

## 2015-04-27 DIAGNOSIS — I10 Essential (primary) hypertension: Secondary | ICD-10-CM | POA: Diagnosis not present

## 2015-04-27 DIAGNOSIS — J019 Acute sinusitis, unspecified: Secondary | ICD-10-CM

## 2015-04-27 DIAGNOSIS — H938X1 Other specified disorders of right ear: Secondary | ICD-10-CM | POA: Diagnosis not present

## 2015-04-27 DIAGNOSIS — E785 Hyperlipidemia, unspecified: Secondary | ICD-10-CM

## 2015-04-27 DIAGNOSIS — H9313 Tinnitus, bilateral: Secondary | ICD-10-CM

## 2015-04-27 DIAGNOSIS — H9319 Tinnitus, unspecified ear: Secondary | ICD-10-CM | POA: Insufficient documentation

## 2015-04-27 MED ORDER — AMOXICILLIN-POT CLAVULANATE 875-125 MG PO TABS: 1.0000 | ORAL_TABLET | Freq: Two times a day (BID) | ORAL | Status: DC

## 2015-04-27 MED ORDER — AZITHROMYCIN 250 MG PO TABS: ORAL_TABLET | ORAL | Status: DC

## 2015-04-27 NOTE — Progress Notes (Signed)
   Subjective:    Patient ID: Belinda Owens, female    DOB: September 28, 1932, 81 y.o.   MRN: [id number]  HPI Has had 2 weeks of HA, right ear pressure and congestion. She feels like has a sinus infection. No fever, chills. Getting thick mucous.  Pressure on the top of her head. No ST.  Has been using some homepathic. Started last night with a metallic taste  In the back of her throat. Chronic bilat ear ringing but the right ear feels like it is clogged with "water" .   Hypertension- Pt denies chest pain, SOB, dizziness, or heart palpitations.  Taking meds as directed w/o problems.  Denies medication side effects.      Review of Systems     Objective:   Physical Exam  Constitutional: She is oriented to person, place, and time. She appears well-developed and well-nourished.  HENT:  Head: Normocephalic and atraumatic.  Right Ear: External ear normal.  Left Ear: External ear normal.  Nose: Nose normal.  Mouth/Throat: Oropharynx is clear and moist.  Left TMs and canals is clear. The Right TM  looks retracted with a little bit of wax in the front. Questionable for possible perforation but it's difficult to tell because of the small amount of wax.  Eyes: Conjunctivae and EOM are normal. Pupils are equal, round, and reactive to light.  Neck: Neck supple. No thyromegaly present.  Cardiovascular: Normal rate, regular rhythm and normal heart sounds.   Pulmonary/Chest: Effort normal and breath sounds normal. She has no wheezes.  Lymphadenopathy:    She has no cervical adenopathy.  Neurological: She is alert and oriented to person, place, and time.  Skin: Skin is warm and dry.  Psychiatric: She has a normal mood and affect.          Assessment & Plan:  Acute sinusitis - will tx with zithromycin. This has worked well for her in the past. She does have some retraction and possible fluid behind the right tympanic membrane. Had been on tree performed today.   Right TM abnormality - recheck in one  year.  Unclear  If perforated or not  Tympanometry unrevealing.    Hypertension-well-controlled today. Due for CMP and lipid panel. Follow-up in 6 months.

## 2015-05-05 ENCOUNTER — Ambulatory Visit: Payer: Medicare Other | Admitting: Family Medicine

## 2015-05-05 ENCOUNTER — Ambulatory Visit: Payer: Medicare Other | Admitting: Sports Medicine

## 2015-05-05 ENCOUNTER — Ambulatory Visit (INDEPENDENT_AMBULATORY_CARE_PROVIDER_SITE_OTHER): Payer: Medicare Other | Admitting: Sports Medicine

## 2015-05-05 ENCOUNTER — Encounter: Payer: Self-pay | Admitting: Sports Medicine

## 2015-05-05 VITALS — BP 132/76 | HR 61 | Ht 62.0 in | Wt 162.0 lb

## 2015-05-05 DIAGNOSIS — E785 Hyperlipidemia, unspecified: Secondary | ICD-10-CM | POA: Diagnosis not present

## 2015-05-05 DIAGNOSIS — M7061 Trochanteric bursitis, right hip: Secondary | ICD-10-CM

## 2015-05-05 DIAGNOSIS — I10 Essential (primary) hypertension: Secondary | ICD-10-CM | POA: Diagnosis not present

## 2015-05-05 NOTE — Assessment & Plan Note (Signed)
Trochanter bursa injection as above per patient request. When she returns from [place] she will do physical therapy focusing on hip abductor's which are weak.

## 2015-05-05 NOTE — Progress Notes (Signed)
  Subjective:    CC: right hip pain  HPI: This is a pleasant 81 year old female, for several weeks she's had increasing pain over the lateral aspect of her right hip, moderate, persistent without radiation, she has a history of right greater trochanteric bursitis, and desires interventional treatment today, she does have trouble coming up to [place] tomorrow and she will be gone for 2 weeks.  Past medical history, Surgical history, Family history not pertinant except as noted below, Social history, Allergies, and medications have been entered into the medical record, reviewed, and no changes needed.   Review of Systems: No fevers, chills, night sweats, weight loss, chest pain, or shortness of breath.   Objective:    General: Well Developed, well nourished, and in no acute distress.  Neuro: Alert and oriented x3, extra-ocular muscles intact, sensation grossly intact.  HEENT: Normocephalic, atraumatic, pupils equal round reactive to light, neck supple, no masses, no lymphadenopathy, thyroid nonpalpable.  Skin: Warm and dry, no rashes. Cardiac: Regular rate and rhythm, no murmurs rubs or gallops, no lower extremity edema.  Respiratory: Clear to auscultation bilaterally. Not using accessory muscles, speaking in full sentences. Right Hip: ROM IR: 60 Deg, ER: 60 Deg, Flexion: 120 Deg, Extension: 100 Deg, Abduction: 45 Deg, Adduction: 45 Deg Strength IR: 5/5, ER: 5/5, Flexion: 5/5, Extension: 5/5, Abduction: 4 -/5, Adduction: 5/5 Pelvic alignment unremarkable to inspection and palpation. Standing hip rotation and gait without trendelenburg / unsteadiness. Greater trochanter with severe No tenderness over piriformis. No SI joint tenderness and normal minimal SI movement.  Procedure:  Injection of Right trochanteric bursa Consent obtained and verified. Time-out conducted. Noted no overlying erythema, induration, or other signs of local infection. Skin prepped in a sterile  fashion. Topical analgesic spray: Ethyl chloride. Completed without difficulty. Meds: spinal needle advanced to the greater trochanter, 1 mL kenalog 40, 4 mL lidocaine injected in a family pattern Pain immediately improved suggesting accurate placement of the medication. Advised to call if fevers/chills, erythema, induration, drainage, or persistent bleeding.  Impression and Recommendations:

## 2015-05-06 LAB — COMPLETE METABOLIC PANEL WITH GFR
ALK PHOS: 50 U/L (ref 39–117)
ALT: 23 U/L (ref 0–35)
AST: 23 U/L (ref 0–37)
Albumin: 4.2 g/dL (ref 3.5–5.2)
BILIRUBIN TOTAL: 0.8 mg/dL (ref 0.2–1.2)
BUN: 17 mg/dL (ref 6–23)
CO2: 30 meq/L (ref 19–32)
Calcium: 9.6 mg/dL (ref 8.4–10.5)
Chloride: 99 mEq/L (ref 96–112)
Creat: 0.6 mg/dL (ref 0.50–1.10)
GFR, Est Non African American: 86 mL/min
GLUCOSE: 93 mg/dL (ref 70–99)
POTASSIUM: 3.9 meq/L (ref 3.5–5.3)
Sodium: 141 mEq/L (ref 135–145)
TOTAL PROTEIN: 6.6 g/dL (ref 6.0–8.3)

## 2015-05-06 LAB — LIPID PANEL
CHOLESTEROL: 161 mg/dL (ref 0–200)
HDL: 80 mg/dL (ref >=46)
LDL CALC: 56 mg/dL (ref 0–99)
Total CHOL/HDL Ratio: 2 Ratio
Triglycerides: 126 mg/dL (ref <150)
VLDL: 25 mg/dL (ref 0–40)

## 2015-05-18 DIAGNOSIS — K219 Gastro-esophageal reflux disease without esophagitis: Secondary | ICD-10-CM | POA: Diagnosis not present

## 2015-05-18 DIAGNOSIS — E785 Hyperlipidemia, unspecified: Secondary | ICD-10-CM | POA: Diagnosis not present

## 2015-05-18 DIAGNOSIS — I10 Essential (primary) hypertension: Secondary | ICD-10-CM | POA: Diagnosis not present

## 2015-06-01 DIAGNOSIS — D369 Benign neoplasm, unspecified site: Secondary | ICD-10-CM | POA: Diagnosis not present

## 2015-06-14 DIAGNOSIS — M9902 Segmental and somatic dysfunction of thoracic region: Secondary | ICD-10-CM | POA: Diagnosis not present

## 2015-06-14 DIAGNOSIS — M9901 Segmental and somatic dysfunction of cervical region: Secondary | ICD-10-CM | POA: Diagnosis not present

## 2015-06-14 DIAGNOSIS — M5413 Radiculopathy, cervicothoracic region: Secondary | ICD-10-CM | POA: Diagnosis not present

## 2015-06-14 DIAGNOSIS — M5032 Other cervical disc degeneration, mid-cervical region: Secondary | ICD-10-CM | POA: Diagnosis not present

## 2015-06-15 ENCOUNTER — Other Ambulatory Visit: Payer: Self-pay | Admitting: *Deleted

## 2015-06-15 MED ORDER — VENLAFAXINE HCL 37.5 MG PO TABS: 37.5000 mg | ORAL_TABLET | Freq: Every day | ORAL | Status: DC

## 2015-06-16 DIAGNOSIS — M9902 Segmental and somatic dysfunction of thoracic region: Secondary | ICD-10-CM | POA: Diagnosis not present

## 2015-06-16 DIAGNOSIS — M5032 Other cervical disc degeneration, mid-cervical region: Secondary | ICD-10-CM | POA: Diagnosis not present

## 2015-06-16 DIAGNOSIS — M5413 Radiculopathy, cervicothoracic region: Secondary | ICD-10-CM | POA: Diagnosis not present

## 2015-06-16 DIAGNOSIS — M9901 Segmental and somatic dysfunction of cervical region: Secondary | ICD-10-CM | POA: Diagnosis not present

## 2015-06-18 DIAGNOSIS — M5413 Radiculopathy, cervicothoracic region: Secondary | ICD-10-CM | POA: Diagnosis not present

## 2015-06-18 DIAGNOSIS — M9902 Segmental and somatic dysfunction of thoracic region: Secondary | ICD-10-CM | POA: Diagnosis not present

## 2015-06-18 DIAGNOSIS — M9901 Segmental and somatic dysfunction of cervical region: Secondary | ICD-10-CM | POA: Diagnosis not present

## 2015-06-18 DIAGNOSIS — M5032 Other cervical disc degeneration, mid-cervical region: Secondary | ICD-10-CM | POA: Diagnosis not present

## 2015-06-21 DIAGNOSIS — M9902 Segmental and somatic dysfunction of thoracic region: Secondary | ICD-10-CM | POA: Diagnosis not present

## 2015-06-21 DIAGNOSIS — M5413 Radiculopathy, cervicothoracic region: Secondary | ICD-10-CM | POA: Diagnosis not present

## 2015-06-21 DIAGNOSIS — M9901 Segmental and somatic dysfunction of cervical region: Secondary | ICD-10-CM | POA: Diagnosis not present

## 2015-06-21 DIAGNOSIS — M5032 Other cervical disc degeneration, mid-cervical region: Secondary | ICD-10-CM | POA: Diagnosis not present

## 2015-06-23 DIAGNOSIS — M9902 Segmental and somatic dysfunction of thoracic region: Secondary | ICD-10-CM | POA: Diagnosis not present

## 2015-06-23 DIAGNOSIS — M5413 Radiculopathy, cervicothoracic region: Secondary | ICD-10-CM | POA: Diagnosis not present

## 2015-06-23 DIAGNOSIS — M5032 Other cervical disc degeneration, mid-cervical region: Secondary | ICD-10-CM | POA: Diagnosis not present

## 2015-06-23 DIAGNOSIS — M9901 Segmental and somatic dysfunction of cervical region: Secondary | ICD-10-CM | POA: Diagnosis not present

## 2015-06-25 DIAGNOSIS — M5413 Radiculopathy, cervicothoracic region: Secondary | ICD-10-CM | POA: Diagnosis not present

## 2015-06-25 DIAGNOSIS — M5032 Other cervical disc degeneration, mid-cervical region: Secondary | ICD-10-CM | POA: Diagnosis not present

## 2015-06-25 DIAGNOSIS — M9901 Segmental and somatic dysfunction of cervical region: Secondary | ICD-10-CM | POA: Diagnosis not present

## 2015-06-25 DIAGNOSIS — M9902 Segmental and somatic dysfunction of thoracic region: Secondary | ICD-10-CM | POA: Diagnosis not present

## 2015-06-28 DIAGNOSIS — M9902 Segmental and somatic dysfunction of thoracic region: Secondary | ICD-10-CM | POA: Diagnosis not present

## 2015-06-28 DIAGNOSIS — M5413 Radiculopathy, cervicothoracic region: Secondary | ICD-10-CM | POA: Diagnosis not present

## 2015-06-28 DIAGNOSIS — M9901 Segmental and somatic dysfunction of cervical region: Secondary | ICD-10-CM | POA: Diagnosis not present

## 2015-06-28 DIAGNOSIS — M5032 Other cervical disc degeneration, mid-cervical region: Secondary | ICD-10-CM | POA: Diagnosis not present

## 2015-07-02 DIAGNOSIS — M9902 Segmental and somatic dysfunction of thoracic region: Secondary | ICD-10-CM | POA: Diagnosis not present

## 2015-07-02 DIAGNOSIS — M5413 Radiculopathy, cervicothoracic region: Secondary | ICD-10-CM | POA: Diagnosis not present

## 2015-07-02 DIAGNOSIS — M9901 Segmental and somatic dysfunction of cervical region: Secondary | ICD-10-CM | POA: Diagnosis not present

## 2015-07-02 DIAGNOSIS — M5032 Other cervical disc degeneration, mid-cervical region: Secondary | ICD-10-CM | POA: Diagnosis not present

## 2015-07-07 DIAGNOSIS — M5413 Radiculopathy, cervicothoracic region: Secondary | ICD-10-CM | POA: Diagnosis not present

## 2015-07-07 DIAGNOSIS — M9902 Segmental and somatic dysfunction of thoracic region: Secondary | ICD-10-CM | POA: Diagnosis not present

## 2015-07-07 DIAGNOSIS — M5032 Other cervical disc degeneration, mid-cervical region: Secondary | ICD-10-CM | POA: Diagnosis not present

## 2015-07-07 DIAGNOSIS — M9901 Segmental and somatic dysfunction of cervical region: Secondary | ICD-10-CM | POA: Diagnosis not present

## 2015-07-21 DIAGNOSIS — M5413 Radiculopathy, cervicothoracic region: Secondary | ICD-10-CM | POA: Diagnosis not present

## 2015-07-21 DIAGNOSIS — M5032 Other cervical disc degeneration, mid-cervical region: Secondary | ICD-10-CM | POA: Diagnosis not present

## 2015-07-21 DIAGNOSIS — M9901 Segmental and somatic dysfunction of cervical region: Secondary | ICD-10-CM | POA: Diagnosis not present

## 2015-07-21 DIAGNOSIS — M9902 Segmental and somatic dysfunction of thoracic region: Secondary | ICD-10-CM | POA: Diagnosis not present

## 2015-08-05 ENCOUNTER — Encounter: Payer: Self-pay | Admitting: Family Medicine

## 2015-08-05 ENCOUNTER — Ambulatory Visit (INDEPENDENT_AMBULATORY_CARE_PROVIDER_SITE_OTHER): Payer: Medicare Other | Admitting: Family Medicine

## 2015-08-05 VITALS — BP 136/82

## 2015-08-05 DIAGNOSIS — J329 Chronic sinusitis, unspecified: Secondary | ICD-10-CM | POA: Diagnosis not present

## 2015-08-05 DIAGNOSIS — G4700 Insomnia, unspecified: Secondary | ICD-10-CM

## 2015-08-05 DIAGNOSIS — Z23 Encounter for immunization: Secondary | ICD-10-CM | POA: Diagnosis not present

## 2015-08-05 DIAGNOSIS — J0111 Acute recurrent frontal sinusitis: Secondary | ICD-10-CM | POA: Diagnosis not present

## 2015-08-05 DIAGNOSIS — I10 Essential (primary) hypertension: Secondary | ICD-10-CM

## 2015-08-05 DIAGNOSIS — E785 Hyperlipidemia, unspecified: Secondary | ICD-10-CM | POA: Diagnosis not present

## 2015-08-05 MED ORDER — AZITHROMYCIN 250 MG PO TABS: ORAL_TABLET | ORAL | Status: AC

## 2015-08-05 MED ORDER — ATENOLOL 25 MG PO TABS: 25.0000 mg | ORAL_TABLET | Freq: Every day | ORAL | Status: DC

## 2015-08-05 MED ORDER — ATORVASTATIN CALCIUM 20 MG PO TABS: 20.0000 mg | ORAL_TABLET | Freq: Every day | ORAL | Status: DC

## 2015-08-05 NOTE — Progress Notes (Signed)
Subjective:    Patient ID: Belinda Owens, female    DOB: [birth year], 81 y.o.   MRN: [id number]  HPI Hypertension- Pt denies chest pain, SOB, dizziness, or heart palpitations.  Taking meds as directed w/o problems.  Denies medication side effects.  Has been salting food more.  She has been taking a decongestant.   Insomnia - she is taking her sleep med 1-2 times per week.    Sinus headaches  - she hs been taking allegra -D for her allergies.  Having pain over her right frontal sinus with a lot of post naasal drip and congestion.  She has been using flonase since July.     Review of Systems  BP 136/82 mmHg    Allergies  Allergen Reactions  . Tylenol With Codeine #3  [Acetaminophen-Codeine] Nausea And Vomiting  . Omeprazole Itching    Past Medical History  Diagnosis Date  . Fibrocystic breast changes of both breasts     Gets yearly mammo and [place]  . History of rheumatic fever     Past Surgical History  Procedure Laterality Date  . Breast biopsy    . Meniscus repair Right   . Rotator cuff repair Right     Social History   Social History  . Marital Status: Divorced    Spouse Name: N/A  . Number of Children: 3  . Years of Education: some colle   Occupational History  . retired.     Social History Main Topics  . Smoking status: Former Smoker    Types: Cigarettes    Quit date: 10/23/1957  . Smokeless tobacco: Not on file  . Alcohol Use: Not on file  . Drug Use: No  . Sexual Activity: Not on file   Other Topics Concern  . Not on file   Social History Narrative   Drinks decaf coffee.  Quit smoking 1959.     Family History  Problem Relation Age of Onset  . Breast cancer Maternal Aunt   . Colon cancer Father   . Asthma Daughter     Outpatient Encounter Prescriptions as of 08/05/2015  Medication Sig  . albuterol (PROAIR HFA) 108 (90 BASE) MCG/ACT inhaler Inhale 2 puffs into the lungs every 6 (six) hours as needed for wheezing or shortness of breath.  Marland Kitchen  aspirin 81 MG tablet Take 81 mg by mouth daily.  Marland Kitchen atenolol (TENORMIN) 25 MG tablet Take 1 tablet (25 mg total) by mouth daily.  Marland Kitchen atorvastatin (LIPITOR) 20 MG tablet Take 1 tablet (20 mg total) by mouth at bedtime.  Marland Kitchen azithromycin (ZITHROMAX) 250 MG tablet 2 Ttabs PO on Day 1, then one a day x 4 days.  Marland Kitchen losartan-hydrochlorothiazide (HYZAAR) 100-25 MG per tablet Take 1 tablet by mouth daily.  . montelukast (SINGULAIR) 10 MG tablet Take 1 tablet (10 mg total) by mouth daily.  Marland Kitchen venlafaxine (EFFEXOR) 37.5 MG tablet Take 1 tablet (37.5 mg total) by mouth daily.  Marland Kitchen zolpidem (AMBIEN) 5 MG tablet Take 1 tablet (5 mg total) by mouth at bedtime as needed for sleep.  . [DISCONTINUED] atenolol (TENORMIN) 25 MG tablet Take 1 tablet (25 mg total) by mouth daily.  . [DISCONTINUED] atorvastatin (LIPITOR) 20 MG tablet Take 1 tablet by mouth at bedtime.   No facility-administered encounter medications on file as of 08/05/2015.          Objective:   Physical Exam  Constitutional: She is oriented to person, place, and time. She appears well-developed and well-nourished.  HENT:  Head: Normocephalic and atraumatic.  Right Ear: External ear normal.  Left Ear: External ear normal.  Nose: Nose normal.  Mouth/Throat: Oropharynx is clear and moist.  TMs and canals are clear.   Eyes: Conjunctivae and EOM are normal. Pupils are equal, round, and reactive to light.  Neck: Neck supple. No thyromegaly present.  Cardiovascular: Normal rate, regular rhythm and normal heart sounds.   Pulmonary/Chest: Effort normal and breath sounds normal. She has no wheezes.  Lymphadenopathy:    She has no cervical adenopathy.  Neurological: She is alert and oriented to person, place, and time.  Skin: Skin is warm and dry.  Psychiatric: She has a normal mood and affect.          Assessment & Plan:  HTN - well controlled. Continue current regimen. Encouraged her to avoid decongestion since just take plain  Allegra.  inosmnia- continue to sleep aid sparingly. Monitor for any sedation  Acute sinusitis - will treat with azithromycin. Call if not better in one week. Since she has recurrent sinusitis and has had problems for years old like to refer her to ENT. When she was living in [place] she actually followed very carefully with a local ENT there. It sounds like it has been several years she's also had allergy testing.  Flu shot given today.

## 2015-08-11 ENCOUNTER — Encounter: Payer: Self-pay | Admitting: Sports Medicine

## 2015-08-11 ENCOUNTER — Ambulatory Visit (INDEPENDENT_AMBULATORY_CARE_PROVIDER_SITE_OTHER): Payer: Medicare Other | Admitting: Sports Medicine

## 2015-08-11 DIAGNOSIS — M5030 Other cervical disc degeneration, unspecified cervical region: Secondary | ICD-10-CM

## 2015-08-11 MED ORDER — GABAPENTIN 300 MG PO CAPS: ORAL_CAPSULE | ORAL | Status: DC

## 2015-08-11 NOTE — Progress Notes (Signed)
  Subjective:    CC: Left shoulder paresthesias  HPI: This is a pleasant 81 year old female, we have treated her for cervical degenerative disc is in the past, and she has done very well, recently she's been having increasing paresthesias in the left supraclavicular region, moderate, persistent, worse with turning her head to the left. No lower extremity symptoms or bowel or bladder dysfunction, no constitutional symptoms.  Past medical history, Surgical history, Family history not pertinant except as noted below, Social history, Allergies, and medications have been entered into the medical record, reviewed, and no changes needed.   Review of Systems: No fevers, chills, night sweats, weight loss, chest pain, or shortness of breath.   Objective:    General: Well Developed, well nourished, and in no acute distress.  Neuro: Alert and oriented x3, extra-ocular muscles intact, sensation grossly intact.  HEENT: Normocephalic, atraumatic, pupils equal round reactive to light, neck supple, no masses, no lymphadenopathy, thyroid nonpalpable.  Skin: Warm and dry, no rashes. Cardiac: Regular rate and rhythm, no murmurs rubs or gallops, no lower extremity edema.  Respiratory: Clear to auscultation bilaterally. Not using accessory muscles, speaking in full sentences. Neck: Positive Spurling sign with reproduction of left cervical plexus/supraclavicular paresthesias. Full neck range of motion Grip strength and sensation normal in bilateral hands Strength good C4 to T1 distribution No sensory change to C4 to T1 Reflexes normal  Impression and Recommendations:

## 2015-08-11 NOTE — Assessment & Plan Note (Signed)
With left-sided subclavicular/cervical plexus radiculopathy. Starting low-dose gabapentin.  If no better we will get an MRI for epidural planning.

## 2015-08-18 DIAGNOSIS — M5413 Radiculopathy, cervicothoracic region: Secondary | ICD-10-CM | POA: Diagnosis not present

## 2015-08-18 DIAGNOSIS — M9902 Segmental and somatic dysfunction of thoracic region: Secondary | ICD-10-CM | POA: Diagnosis not present

## 2015-08-18 DIAGNOSIS — M9901 Segmental and somatic dysfunction of cervical region: Secondary | ICD-10-CM | POA: Diagnosis not present

## 2015-08-18 DIAGNOSIS — M50822 Other cervical disc disorders at C5-C6 level: Secondary | ICD-10-CM | POA: Diagnosis not present

## 2015-08-19 ENCOUNTER — Encounter: Payer: Self-pay | Admitting: Family Medicine

## 2015-08-19 ENCOUNTER — Ambulatory Visit (INDEPENDENT_AMBULATORY_CARE_PROVIDER_SITE_OTHER): Payer: Medicare Other | Admitting: Family Medicine

## 2015-08-19 VITALS — BP 131/59 | HR 77 | Temp 98.3°F | Resp 18 | Wt 165.1 lb

## 2015-08-19 DIAGNOSIS — Z780 Asymptomatic menopausal state: Secondary | ICD-10-CM | POA: Diagnosis not present

## 2015-08-19 DIAGNOSIS — Z0000 Encounter for general adult medical examination without abnormal findings: Secondary | ICD-10-CM | POA: Diagnosis not present

## 2015-08-19 DIAGNOSIS — Z1231 Encounter for screening mammogram for malignant neoplasm of breast: Secondary | ICD-10-CM | POA: Diagnosis not present

## 2015-08-19 DIAGNOSIS — Z23 Encounter for immunization: Secondary | ICD-10-CM | POA: Diagnosis not present

## 2015-08-19 NOTE — Progress Notes (Signed)
Subjective:    Belinda Owens is a 81 y.o. female who presents for Medicare Annual/Subsequent preventive examination.  Preventive Screening-Counseling & Management  Tobacco History  Smoking status  . Former Smoker  . Types: Cigarettes  . Quit date: 10/23/1957  Smokeless tobacco  . Not on file     Problems Prior to Visit 1. She is going to weight watchers and has lost 10 lbs so far.  She is not exercise.   2. Declined shingles vaccine.    Current Problems (verified) Patient Active Problem List   Diagnosis Date Noted  . Trochanteric bursitis of right hip 05/05/2015  . Ear ringing sound 04/27/2015  . Shoulder pain, left 03/16/2015  . Degenerative disc disease, cervical 03/16/2015  . Insomnia 03/09/2015  . Hyperlipidemia 02/03/2015  . Essential hypertension 02/03/2015  . DDD (degenerative disc disease), lumbar 02/03/2015  . GERD (gastroesophageal reflux disease) 02/03/2015  . Anxiety state 02/03/2015  . Fibrocystic breast changes of both breasts 02/03/2015  . Microscopic hematuria 02/03/2015  . Allergic bronchitis 02/03/2015    Medications Prior to Visit Current Outpatient Prescriptions on File Prior to Visit  Medication Sig Dispense Refill  . albuterol (PROAIR HFA) 108 (90 BASE) MCG/ACT inhaler Inhale 2 puffs into the lungs every 6 (six) hours as needed for wheezing or shortness of breath. 3 Inhaler 2  . aspirin 81 MG tablet Take 81 mg by mouth daily.    Marland Kitchen atenolol (TENORMIN) 25 MG tablet Take 1 tablet (25 mg total) by mouth daily. 90 tablet 1  . atorvastatin (LIPITOR) 20 MG tablet Take 1 tablet (20 mg total) by mouth at bedtime. 90 tablet 1  . gabapentin (NEURONTIN) 300 MG capsule One tab PO qHS for a week, then BID for a week, then TID. May double weekly to a max of 3,600mg /day 90 capsule 3  . losartan-hydrochlorothiazide (HYZAAR) 100-25 MG per tablet Take 1 tablet by mouth daily. 90 tablet 1  . montelukast (SINGULAIR) 10 MG tablet Take 1 tablet (10 mg total) by mouth  daily. 90 tablet 3  . venlafaxine (EFFEXOR) 37.5 MG tablet Take 1 tablet (37.5 mg total) by mouth daily. 90 tablet 1  . zolpidem (AMBIEN) 5 MG tablet Take 1 tablet (5 mg total) by mouth at bedtime as needed for sleep. 30 tablet 3   No current facility-administered medications on file prior to visit.    Current Medications (verified) Current Outpatient Prescriptions  Medication Sig Dispense Refill  . albuterol (PROAIR HFA) 108 (90 BASE) MCG/ACT inhaler Inhale 2 puffs into the lungs every 6 (six) hours as needed for wheezing or shortness of breath. 3 Inhaler 2  . aspirin 81 MG tablet Take 81 mg by mouth daily.    Marland Kitchen atenolol (TENORMIN) 25 MG tablet Take 1 tablet (25 mg total) by mouth daily. 90 tablet 1  . atorvastatin (LIPITOR) 20 MG tablet Take 1 tablet (20 mg total) by mouth at bedtime. 90 tablet 1  . gabapentin (NEURONTIN) 300 MG capsule One tab PO qHS for a week, then BID for a week, then TID. May double weekly to a max of 3,600mg /day 90 capsule 3  . losartan-hydrochlorothiazide (HYZAAR) 100-25 MG per tablet Take 1 tablet by mouth daily. 90 tablet 1  . montelukast (SINGULAIR) 10 MG tablet Take 1 tablet (10 mg total) by mouth daily. 90 tablet 3  . venlafaxine (EFFEXOR) 37.5 MG tablet Take 1 tablet (37.5 mg total) by mouth daily. 90 tablet 1  . zolpidem (AMBIEN) 5 MG tablet Take 1 tablet (5  mg total) by mouth at bedtime as needed for sleep. 30 tablet 3   No current facility-administered medications for this visit.     Allergies (verified) Tylenol with codeine #3  and Omeprazole   PAST HISTORY  Family History Family History  Problem Relation Age of Onset  . Breast cancer Maternal Aunt   . Colon cancer Father   . Asthma Daughter     Social History Social History  Substance Use Topics  . Smoking status: Former Smoker    Types: Cigarettes    Quit date: 10/23/1957  . Smokeless tobacco: Not on file  . Alcohol Use: Not on file     Are there smokers in your home (other than  you)? No  Risk Factors Current exercise habits: The patient does not participate in regular exercise at present.  Dietary issues discussed: None   Cardiac risk factors: advanced age (older than 26 for men, 15 for women), obesity (BMI >= 30 kg/m2) and sedentary lifestyle.  Depression Screen (Note: if answer to either of the following is "Yes", a more complete depression screening is indicated)   Over the past two weeks, have you felt down, depressed or hopeless? No  Over the past two weeks, have you felt little interest or pleasure in doing things? No  Have you lost interest or pleasure in daily life? No  Do you often feel hopeless? No  Do you cry easily over simple problems? No  Activities of Daily Living In your present state of health, do you have any difficulty performing the following activities?:  Driving? No Managing money?  No Feeding yourself? No Getting from bed to chair? No   Climbing a flight of stairs? No Preparing food and eating?: No Bathing or showering? No Getting dressed: No Getting to the toilet? No Using the toilet:No Moving around from place to place: No In the past year have you fallen or had a near fall?:Yes    Hearing Difficulties: Yes Do you often ask people to speak up or repeat themselves? No Do you experience ringing or noises in your ears? No Do you have difficulty understanding soft or whispered voices? No   Do you feel that you have a problem with memory? No  Do you often misplace items? No  Do you feel safe at home?  Yes  Cognitive Testing  Alert? Yes  Normal Appearance?Yes  Oriented to person? Yes  Place? Yes   Time? Yes  Recall of three objects?  Yes  Can perform simple calculations? Yes  Displays appropriate judgment?Yes  Can read the correct time from a watch face?Yes    6 CIT score of 0/28 ( normal)    Advanced Directives have been discussed with the patient? Yes  List the Names of Other Physician/Practitioners you currently  use: 1.  Dr. Dianah Field - sport med  Indicate any recent Medical Services you may have received from other than [facility] providers in the past year (date may be approximate).  Immunization History  Administered Date(s) Administered  . Influenza,inj,Quad PF,36+ Mos 08/05/2015  . Pneumococcal Conjugate-13 08/19/2015  . Tdap 03/23/2014    Screening Tests Health Maintenance  Topic Date Due  . DEXA SCAN  10/14/1999  . ZOSTAVAX  08/18/2016 (Originally 10/13/1994)  . INFLUENZA VACCINE  05/23/2016  . PNA vac Low Risk Adult (2 of 2 - PPSV23) 08/18/2016  . TETANUS/TDAP  03/23/2024    All answers were reviewed with the patient and necessary referrals were made:  METHENEY,CATHERINE, MD   08/19/2015  History reviewed: allergies, current medications, past family history, past medical history, past social history, past surgical history and problem list  Review of Systems Pertinent items noted in HPI and remainder of comprehensive ROS otherwise negative.    Objective:     Vision by Snellen chart: right eye:20/30, left eye:20/30  Body mass index is 30.19 kg/(m^2). BP 131/59 mmHg  Pulse 77  Temp(Src) 98.3 F (36.8 C) (Oral)  Resp 18  Wt 165 lb 1.6 oz (74.889 kg)  SpO2 98%  BP 131/59 mmHg  Pulse 77  Temp(Src) 98.3 F (36.8 C) (Oral)  Resp 18  Wt 165 lb 1.6 oz (74.889 kg)  SpO2 98% General appearance: alert, cooperative and appears stated age Head: Normocephalic, without obvious abnormality, atraumatic Eyes: conj clear, EOMI, pEERLA Ears: normal TM's and external ear canals both ears Nose: Nares normal. Septum midline. Mucosa normal. No drainage or sinus tenderness. Throat: lips, mucosa, and tongue normal; teeth and gums normal Neck: no adenopathy, no carotid bruit, no JVD, supple, symmetrical, trachea midline and thyroid not enlarged, symmetric, no tenderness/mass/nodules Back: symmetric, no curvature. ROM normal. No CVA tenderness. Lungs: clear to auscultation  bilaterally Breasts: normal appearance, no masses or tenderness Heart: regular rate and rhythm, S1, S2 normal, no murmur, click, rub or gallop Abdomen: soft, non-tender; bowel sounds normal; no masses,  no organomegaly Extremities: extremities normal, atraumatic, no cyanosis or edema Pulses: 2+ and symmetric Skin: Skin color, texture, turgor normal. No rashes or lesions Lymph nodes: Cervical, supraclavicular, and axillary nodes normal. Neurologic: Alert and oriented X 3, normal strength and tone. Normal symmetric reflexes. Normal coordination and gait     Assessment:     Medicare Wellness Exam       Plan:     During the course of the visit the patient was educated and counseled about appropriate screening and preventive services including:    Pneumococcal vaccine  - given Prevnar 13 today.   HTN-  Home BP looks great.  Did have a coupole of higher BP ss in the 160s.  I asked her to keep an eye on these adn see if she sees a pattern such as when she eats more salt or doesn't sleep well, etc. F/U in 6 months.   Will order screening mammogram.   Declines shingles vaccine.   Encouraged her to consider advnaced directives.    Diet review for nutrition referral? Yes ____  Not Indicated _X__   Patient Instructions (the written plan) was given to the patient.  Medicare Attestation I have personally reviewed: The patient's medical and social history Their use of alcohol, tobacco or illicit drugs Their current medications and supplements The patient's functional ability including ADLs,fall risks, home safety risks, cognitive, and hearing and visual impairment Diet and physical activities Evidence for depression or mood disorders  The patient's weight, height, BMI, and visual acuity have been recorded in the chart.  I have made referrals, counseling, and provided education to the patient based on review of the above and I have provided the patient with a written personalized care  plan for preventive services.     METHENEY,CATHERINE, MD   08/19/2015

## 2015-08-26 DIAGNOSIS — R51 Headache: Secondary | ICD-10-CM | POA: Diagnosis not present

## 2015-08-26 DIAGNOSIS — H6121 Impacted cerumen, right ear: Secondary | ICD-10-CM | POA: Diagnosis not present

## 2015-08-30 ENCOUNTER — Other Ambulatory Visit: Payer: Self-pay

## 2015-08-30 MED ORDER — LOSARTAN POTASSIUM-HCTZ 100-25 MG PO TABS: 1.0000 | ORAL_TABLET | Freq: Every day | ORAL | Status: DC

## 2015-08-31 DIAGNOSIS — J328 Other chronic sinusitis: Secondary | ICD-10-CM | POA: Diagnosis not present

## 2015-08-31 DIAGNOSIS — J324 Chronic pansinusitis: Secondary | ICD-10-CM | POA: Diagnosis not present

## 2015-08-31 DIAGNOSIS — J343 Hypertrophy of nasal turbinates: Secondary | ICD-10-CM | POA: Diagnosis not present

## 2015-08-31 DIAGNOSIS — J342 Deviated nasal septum: Secondary | ICD-10-CM | POA: Diagnosis not present

## 2015-09-02 ENCOUNTER — Other Ambulatory Visit: Payer: Self-pay

## 2015-09-02 MED ORDER — LOSARTAN POTASSIUM-HCTZ 100-25 MG PO TABS: 1.0000 | ORAL_TABLET | Freq: Every day | ORAL | Status: DC

## 2015-09-09 ENCOUNTER — Ambulatory Visit (INDEPENDENT_AMBULATORY_CARE_PROVIDER_SITE_OTHER): Payer: Medicare Other | Admitting: Sports Medicine

## 2015-09-09 ENCOUNTER — Encounter: Payer: Self-pay | Admitting: Sports Medicine

## 2015-09-09 VITALS — BP 141/59 | HR 65 | Ht 62.0 in | Wt 170.0 lb

## 2015-09-09 DIAGNOSIS — M5030 Other cervical disc degeneration, unspecified cervical region: Secondary | ICD-10-CM

## 2015-09-09 DIAGNOSIS — M25512 Pain in left shoulder: Secondary | ICD-10-CM

## 2015-09-09 NOTE — Assessment & Plan Note (Signed)
Left subclavicular distribution radiculopathy is now 100% resolved with gabapentin.

## 2015-09-09 NOTE — Progress Notes (Signed)
  Subjective:    CC: "neck pain"  HPI: Patient's neck pain and supraclavicular tingling has improved significantly after starting gabapentin therapy 300 mg TID PO. She says that the pain has resolved "almost completely" and the "tingling" has come down significantly. She is happy with the results and wants to continue the medicaiton.  The patient does say that she is having "arm pain" today. She has had this for the past month and has taken some OTC NSAIDs with moderate relief. She says that the pain is worse with certain movements and is mainly in her shoulder and upper arm. She says that it feels similar to her past shoulder pain, which was relieved significantly with a steroid injection.  She is also complaining of the return of hip pain after she received a hip injection ~6 months ago. She says that the shoulder is what bothers her more but she is concerned that this pain will worsen. It is not currently inhibiting her from ambulation but does worsen with prolonged use.  Past medical history, Surgical history, Family history not pertinant except as noted below, Social history, Allergies, and medications have been entered into the medical record, reviewed, and no changes needed.   Review of Systems: No fevers, chills, night sweats, weight loss, chest pain, or shortness of breath.   Objective:    General: Well Developed, well nourished, and in no acute distress.  Neuro: Alert and oriented x3, extra-ocular muscles intact, sensation grossly intact.  HEENT: Normocephalic, atraumatic, pupils equal round reactive to light, neck supple, no masses, no lymphadenopathy, thyroid nonpalpable.  Skin: Warm and dry, no rashes. Cardiac: Regular rate and rhythm, no murmurs rubs or gallops, no lower extremity edema.  Respiratory: Clear to auscultation bilaterally. Not using accessory muscles, speaking in full sentences. Neck: Inspection unremarkable. No palpable stepoffs or tenderness. Negative Spurling's  maneuver bilaterally. Full neck range of motion Grip strength and sensation normal in bilateral hands Strength good C4 to T1 distribution No sensory change to C4 to T1 Negative Hoffman sign bilaterally Reflexes normal Left Shoulder: Inspection reveals no abnormalities, atrophy or asymmetry. Palpation provoked tenderness on anterior joint line with no tenderness over AC joint or bicipital groove. ROM is full in all planes. Rotator cuff strength normal throughout. Positive signs of impingement with positive Neer and Hawkin's tests, and negative empty can sign. Speeds and Yergason's tests normal. No labral pathology noted with negative Obrien's, negative clunk and good stability. Normal scapular function observed. No painful arc and no drop arm sign. No apprehension sign Right Hip: ROM IR: 45 Deg, ER: 45 Deg, Flexion: 120 Deg, Extension: 100 Deg, Abduction: 45 Deg, Adduction: 45 Deg Strength IR: 5/5, ER: 5/5, Flexion: 5/5, Extension: 5/5, Abduction: 5/5, Adduction: 5/5 Pelvic alignment unremarkable to inspection and palpation. Standing hip rotation and gait without trendelenburg sign / unsteadiness. Greater trochanter with tenderness to palpation. No tenderness over piriformis. No pain with FABER or FADIR. No SI joint tenderness and normal minimal SI movement.   Impression and Recommendations:    Patient's cervical degenerative disc disease has improved significantly with gabapentin therapy. Will continue gabapentin at current dose.  Patient's recurrence of shoulder pain will be treated with glenohumeral and subacromial bursa steroid injections as this has worked in the past. Last shoulder injection was in may of 2016. Patient instructed to return as needed.  Patient was instructed to schedule a return visit for evaluation of possible trochanteric bursa injection if the hip pain does not resolve.

## 2015-09-09 NOTE — Assessment & Plan Note (Signed)
Subacromial and glenohumeral injections as above, previous injections were 5 months ago. She did have some pain in the trochanteric bursa which is not severe and simply watch for now.

## 2015-09-15 DIAGNOSIS — M9902 Segmental and somatic dysfunction of thoracic region: Secondary | ICD-10-CM | POA: Diagnosis not present

## 2015-09-15 DIAGNOSIS — M5413 Radiculopathy, cervicothoracic region: Secondary | ICD-10-CM | POA: Diagnosis not present

## 2015-09-15 DIAGNOSIS — M50322 Other cervical disc degeneration at C5-C6 level: Secondary | ICD-10-CM | POA: Diagnosis not present

## 2015-09-15 DIAGNOSIS — J324 Chronic pansinusitis: Secondary | ICD-10-CM | POA: Diagnosis not present

## 2015-09-15 DIAGNOSIS — M9901 Segmental and somatic dysfunction of cervical region: Secondary | ICD-10-CM | POA: Diagnosis not present

## 2015-09-22 ENCOUNTER — Ambulatory Visit: Payer: Medicare Other

## 2015-09-22 ENCOUNTER — Other Ambulatory Visit: Payer: Medicare Other

## 2015-09-22 ENCOUNTER — Ambulatory Visit (INDEPENDENT_AMBULATORY_CARE_PROVIDER_SITE_OTHER): Payer: Medicare Other

## 2015-09-22 DIAGNOSIS — Z1231 Encounter for screening mammogram for malignant neoplasm of breast: Secondary | ICD-10-CM | POA: Diagnosis not present

## 2015-09-23 ENCOUNTER — Other Ambulatory Visit: Payer: Medicare Other

## 2015-09-23 ENCOUNTER — Ambulatory Visit (INDEPENDENT_AMBULATORY_CARE_PROVIDER_SITE_OTHER): Payer: Medicare Other

## 2015-09-23 ENCOUNTER — Ambulatory Visit: Payer: Medicare Other

## 2015-09-23 DIAGNOSIS — Z13820 Encounter for screening for osteoporosis: Secondary | ICD-10-CM

## 2015-09-23 DIAGNOSIS — Z780 Asymptomatic menopausal state: Secondary | ICD-10-CM

## 2015-09-29 ENCOUNTER — Encounter: Payer: Self-pay | Admitting: Family Medicine

## 2015-09-29 DIAGNOSIS — M8580 Other specified disorders of bone density and structure, unspecified site: Secondary | ICD-10-CM | POA: Insufficient documentation

## 2015-10-01 ENCOUNTER — Telehealth: Payer: Self-pay

## 2015-10-01 NOTE — Telephone Encounter (Signed)
-----   Message from Hali Marry, MD sent at 09/27/2015  8:52 PM EST ----- Please call patient. Normal mammogram.  Repeat in 1 year.

## 2015-10-01 NOTE — Telephone Encounter (Signed)
Patient aware of normal mammogram and recommendations. 

## 2015-10-13 DIAGNOSIS — J324 Chronic pansinusitis: Secondary | ICD-10-CM | POA: Diagnosis not present

## 2015-10-19 DIAGNOSIS — H43813 Vitreous degeneration, bilateral: Secondary | ICD-10-CM | POA: Diagnosis not present

## 2015-10-19 DIAGNOSIS — H527 Unspecified disorder of refraction: Secondary | ICD-10-CM | POA: Diagnosis not present

## 2015-10-19 DIAGNOSIS — Z961 Presence of intraocular lens: Secondary | ICD-10-CM | POA: Diagnosis not present

## 2015-10-19 DIAGNOSIS — H35373 Puckering of macula, bilateral: Secondary | ICD-10-CM | POA: Diagnosis not present

## 2015-10-19 DIAGNOSIS — H40003 Preglaucoma, unspecified, bilateral: Secondary | ICD-10-CM | POA: Diagnosis not present

## 2015-10-27 ENCOUNTER — Ambulatory Visit (INDEPENDENT_AMBULATORY_CARE_PROVIDER_SITE_OTHER): Payer: Medicare Other | Admitting: Sports Medicine

## 2015-10-27 ENCOUNTER — Encounter: Payer: Self-pay | Admitting: Sports Medicine

## 2015-10-27 VITALS — BP 133/76 | HR 53 | Temp 97.9°F | Resp 16 | Wt 174.3 lb

## 2015-10-27 DIAGNOSIS — M7061 Trochanteric bursitis, right hip: Secondary | ICD-10-CM

## 2015-10-27 MED ORDER — MELOXICAM 15 MG PO TABS: ORAL_TABLET | ORAL | Status: DC

## 2015-10-27 NOTE — Assessment & Plan Note (Signed)
Left and right trochanteric bursae injection as above, previous injection was 7 months ago.

## 2015-10-27 NOTE — Progress Notes (Signed)
  Subjective:    CC:  Bilateral hip pain  HPI: Belinda Owens has bilateral trochanteric bursitis, we injected her right trochanteric bursa bouts 6 months ago , she'll fantastic response until now and is desiring repeat bilateral injections. Pain is severe, persistent, localized laterally over the greater trochanter. No trauma. No constitutional symptoms.  Past medical history, Surgical history, Family history not pertinant except as noted below, Social history, Allergies, and medications have been entered into the medical record, reviewed, and no changes needed.   Review of Systems: No fevers, chills, night sweats, weight loss, chest pain, or shortness of breath.   Objective:    General: Well Developed, well nourished, and in no acute distress.  Neuro: Alert and oriented x3, extra-ocular muscles intact, sensation grossly intact.  HEENT: Normocephalic, atraumatic, pupils equal round reactive to light, neck supple, no masses, no lymphadenopathy, thyroid nonpalpable.  Skin: Warm and dry, no rashes. Cardiac: Regular rate and rhythm, no murmurs rubs or gallops, no lower extremity edema.  Respiratory: Clear to auscultation bilaterally. Not using accessory muscles, speaking in full sentences.  Procedure:  Injection of Left trochanteric bursa Consent obtained and verified. Time-out conducted. Noted no overlying erythema, induration, or other signs of local infection. Skin prepped in a sterile fashion. Topical analgesic spray: Ethyl chloride. Completed without difficulty. Meds:22-gauge spinal needle advanced to the greater trochanter, contacted bone and withdrawn slightly, 1 mL kenalog 40, 4 mL lidocaine injected easily. Pain immediately improved suggesting accurate placement of the medication. Advised to call if fevers/chills, erythema, induration, drainage, or persistent bleeding.  Procedure:  Injection of right trochanteric bursa Consent obtained and verified. Time-out conducted. Noted no  overlying erythema, induration, or other signs of local infection. Skin prepped in a sterile fashion. Topical analgesic spray: Ethyl chloride. Completed without difficulty. Meds:22-gauge spinal needle advanced to the greater trochanter, contacted bone and withdrawn slightly, 1 mL kenalog 40, 4 mL lidocaine injected easily. Pain immediately improved suggesting accurate placement of the medication. Advised to call if fevers/chills, erythema, induration, drainage, or persistent bleeding.  Impression and Recommendations:    I spent 25 minutes with this patient, greater than 50% was face-to-face time counseling regarding the above diagnoses, this time was separate from the time spent performing the procedure

## 2015-11-04 ENCOUNTER — Encounter: Payer: Self-pay | Admitting: Family Medicine

## 2015-11-04 ENCOUNTER — Ambulatory Visit (INDEPENDENT_AMBULATORY_CARE_PROVIDER_SITE_OTHER): Payer: Medicare Other | Admitting: Family Medicine

## 2015-11-04 VITALS — BP 155/84 | HR 47 | Ht 62.0 in | Wt 174.0 lb

## 2015-11-04 DIAGNOSIS — R635 Abnormal weight gain: Secondary | ICD-10-CM

## 2015-11-04 DIAGNOSIS — F430 Acute stress reaction: Secondary | ICD-10-CM | POA: Diagnosis not present

## 2015-11-04 DIAGNOSIS — J019 Acute sinusitis, unspecified: Secondary | ICD-10-CM

## 2015-11-04 DIAGNOSIS — I10 Essential (primary) hypertension: Secondary | ICD-10-CM

## 2015-11-04 LAB — COMPLETE METABOLIC PANEL WITH GFR
ALBUMIN: 4.5 g/dL (ref 3.6–5.1)
ALK PHOS: 48 U/L (ref 33–130)
ALT: 23 U/L (ref 6–29)
AST: 20 U/L (ref 10–35)
BILIRUBIN TOTAL: 0.8 mg/dL (ref 0.2–1.2)
BUN: 22 mg/dL (ref 7–25)
CALCIUM: 10 mg/dL (ref 8.6–10.4)
CO2: 29 mmol/L (ref 20–31)
CREATININE: 0.75 mg/dL (ref 0.60–0.88)
Chloride: 98 mmol/L (ref 98–110)
GFR, EST AFRICAN AMERICAN: 86 mL/min (ref >=60)
GFR, EST NON AFRICAN AMERICAN: 75 mL/min (ref >=60)
Glucose, Bld: 82 mg/dL (ref 65–99)
Potassium: 4.7 mmol/L (ref 3.5–5.3)
Sodium: 137 mmol/L (ref 135–146)
TOTAL PROTEIN: 6.9 g/dL (ref 6.1–8.1)

## 2015-11-04 MED ORDER — METOPROLOL SUCCINATE ER 25 MG PO TB24: 25.0000 mg | ORAL_TABLET | Freq: Every day | ORAL | Status: DC

## 2015-11-04 MED ORDER — LOSARTAN POTASSIUM-HCTZ 100-25 MG PO TABS: 1.0000 | ORAL_TABLET | Freq: Every day | ORAL | Status: DC

## 2015-11-04 NOTE — Progress Notes (Signed)
   Subjective:    Patient ID: Belinda Owens, female    DOB: [birth year], 82 y.o.   MRN: [id number]  HPI   Here today bc her BPS have been running high the last couple of week. Has been very stressed the last 2 weeks. Says a friend in dying he has liver and kidney failure.  Her daughter died a year ago.  She has gained about about 10 lbs.  feels very tearful and stressed and overwhelmed.  Started a zpack yesterday for a sinus infection. Seeing Dr. Jannifer Franklin and using an irriagation and compund. Last sinus infection was 6 months ago.  Lots of pain and pressure over her nasal bridge and between her eyes. Had some pain in her right eye this morning.  Broke a blood vessel in her eye last week and went to her eye doc and was told everything was OK.   Abnormal weight gain-she is just very concerned about her weight. She says she is just constantly craving food. She says she doesn't eat many carbs when she started to list things that she's made recently such as cake and macaroni salad it sounds like she might be getting more than she thinks.   Review of Systems     Objective:   Physical Exam  Constitutional: She is oriented to person, place, and time. She appears well-developed and well-nourished.  HENT:  Head: Normocephalic and atraumatic.  Cardiovascular: Normal rate, regular rhythm and normal heart sounds.   Pulmonary/Chest: Effort normal and breath sounds normal.  Neurological: She is alert and oriented to person, place, and time.  Skin: Skin is warm and dry.  Psychiatric: She has a normal mood and affect. Her behavior is normal.          Assessment & Plan:  HTN- uncontrolled. Will change to metoprolol.  Monitor BP at home.  F/U in 2-3 weeks   Acute stress reaction-recommended she consider seeing a therapist or counselor. She declined and says she really doesn't like that that would help her at this time. It also consider increasing her Effexor. I will see her back in a few weeks for her  blood pressures I can see how she's doing at that time.  HA/sinus pain - started zpack yesterday. His to call or return if she's not feeling better in 1 week.  Abnormal weight gain-We discussed making sure she is getting adequate protein as this will help her feel full longer and making sure that she is getting plenty of that she's been eating some small snacks in between meals that are healthy and low calorie such as popcorn, nuts, piece of fruit, small couple yogurt etc.

## 2015-11-05 LAB — TSH: TSH: 2.023 u[IU]/mL (ref 0.350–4.500)

## 2015-11-05 NOTE — Progress Notes (Signed)
Quick Note:  All labs are normal. ______ 

## 2015-11-18 ENCOUNTER — Encounter: Payer: Self-pay | Admitting: Family Medicine

## 2015-11-18 ENCOUNTER — Ambulatory Visit (INDEPENDENT_AMBULATORY_CARE_PROVIDER_SITE_OTHER): Payer: Medicare Other | Admitting: Family Medicine

## 2015-11-18 VITALS — BP 133/78 | HR 53 | Ht 62.0 in | Wt 171.0 lb

## 2015-11-18 DIAGNOSIS — R635 Abnormal weight gain: Secondary | ICD-10-CM | POA: Diagnosis not present

## 2015-11-18 DIAGNOSIS — I10 Essential (primary) hypertension: Secondary | ICD-10-CM

## 2015-11-18 MED ORDER — METOPROLOL SUCCINATE ER 25 MG PO TB24: 25.0000 mg | ORAL_TABLET | Freq: Every day | ORAL | Status: DC

## 2015-11-18 NOTE — Progress Notes (Signed)
   Subjective:    Patient ID: Belinda Owens, female    DOB: Dec 04, 1931, 82 y.o.   MRN: [id number]  HPI Hypertension-  Started metoprolol about 2 weeks ago.  Pt denies chest pain, SOB, dizziness, or heart palpitations.  Taking meds as directed w/o problems.  Denies medication side effects.  She says since starting her metoprolol she feels like the pressure in her head has been better. Says she feels like the "cobwebs have been cleared from her head".  Home BPs in the 130s the last 5 days.  She had been getting 150s at home.   Abnormal weight gain - she is doing well. She has lost 3 lbs. She has gotten back on track with her diet. She is on weight wtachers.    Review of Systems     Objective:   Physical Exam  Constitutional: She is oriented to person, place, and time. She appears well-developed and well-nourished.  HENT:  Head: Normocephalic and atraumatic.  Cardiovascular: Normal rate, regular rhythm and normal heart sounds.   Pulmonary/Chest: Effort normal and breath sounds normal.  Neurological: She is alert and oriented to person, place, and time.  Skin: Skin is warm and dry.  Psychiatric: She has a normal mood and affect. Her behavior is normal.          Assessment & Plan:  HTN - repeat blood pressure today looked much better and it sounds like her home blood pressures have been more in line of the last 5 days. It can sometimes take a couple weeks for the medication to reach full efficacy and I think we might be there. Encouraged her to continue to keep an eye on her blood pressures and follow-up in one month. Bring in blood pressure cuff from home as well as home readings for me to review. If at that point everything looks great then we can go back to every 3 or 4 month visits. She is doing well on metoprolol in place of the atenolol and actually feels much better. Will add atenolol to her intolerance list.  Abnormal weight gain  - I'm actually very proud of her. She's gotten back  on track and has actually lost 3 pounds since I last saw her which is actually very impressive. Continue work on diet and exercise.

## 2015-12-01 DIAGNOSIS — M5413 Radiculopathy, cervicothoracic region: Secondary | ICD-10-CM | POA: Diagnosis not present

## 2015-12-01 DIAGNOSIS — M9902 Segmental and somatic dysfunction of thoracic region: Secondary | ICD-10-CM | POA: Diagnosis not present

## 2015-12-01 DIAGNOSIS — M5032 Other cervical disc degeneration, mid-cervical region, unspecified level: Secondary | ICD-10-CM | POA: Diagnosis not present

## 2015-12-01 DIAGNOSIS — M9901 Segmental and somatic dysfunction of cervical region: Secondary | ICD-10-CM | POA: Diagnosis not present

## 2015-12-16 ENCOUNTER — Ambulatory Visit (INDEPENDENT_AMBULATORY_CARE_PROVIDER_SITE_OTHER): Payer: Medicare Other | Admitting: Family Medicine

## 2015-12-16 ENCOUNTER — Encounter: Payer: Self-pay | Admitting: Family Medicine

## 2015-12-16 VITALS — BP 143/80 | HR 66 | Wt 174.0 lb

## 2015-12-16 DIAGNOSIS — M5030 Other cervical disc degeneration, unspecified cervical region: Secondary | ICD-10-CM | POA: Diagnosis not present

## 2015-12-16 DIAGNOSIS — G4700 Insomnia, unspecified: Secondary | ICD-10-CM

## 2015-12-16 DIAGNOSIS — I10 Essential (primary) hypertension: Secondary | ICD-10-CM | POA: Diagnosis not present

## 2015-12-16 DIAGNOSIS — F411 Generalized anxiety disorder: Secondary | ICD-10-CM

## 2015-12-16 DIAGNOSIS — N6011 Diffuse cystic mastopathy of right breast: Secondary | ICD-10-CM

## 2015-12-16 DIAGNOSIS — N6012 Diffuse cystic mastopathy of left breast: Secondary | ICD-10-CM

## 2015-12-16 MED ORDER — GABAPENTIN 300 MG PO CAPS: 300.0000 mg | ORAL_CAPSULE | Freq: Three times a day (TID) | ORAL | Status: DC

## 2015-12-16 MED ORDER — ZOLPIDEM TARTRATE 5 MG PO TABS: 2.5000 mg | ORAL_TABLET | Freq: Every evening | ORAL | Status: DC | PRN

## 2015-12-16 MED ORDER — VENLAFAXINE HCL 37.5 MG PO TABS: 37.5000 mg | ORAL_TABLET | Freq: Every day | ORAL | Status: DC

## 2015-12-16 NOTE — Progress Notes (Signed)
   Subjective:    Patient ID: Belinda Owens, female    DOB: June 09, 1932, 82 y.o.   MRN: [id number]  HPI  Hypertension- Pt denies chest pain, SOB, dizziness, or heart palpitations.  Taking meds as directed w/o problems.  Denies medication side effects.    Insomnia - she takes her Ambien usually as needed. She hasn't been on it for several months since starting the gabapentin. She wants to know if she could actually take the Ambien occasionally with the gabapentin. This is some nights she doesn't sleep well at all.  Anxiety - doing well on low dose Effexor.  She does need refills.   DDD cervical - she is now on the gabapentin 3 mg 3 times a day and that seems to be working well for her. She does need a new perception sent to her mail order pharmacy. He was originally prescribed by Dr. Dianah Owens. It seems to be effective and is working and would like me to take over the prescription.  Review of Systems     Objective:   Physical Exam  Constitutional: She is oriented to person, place, and time. She appears well-developed and well-nourished.  HENT:  Head: Normocephalic and atraumatic.  Cardiovascular: Normal rate, regular rhythm and normal heart sounds.   Pulmonary/Chest: Effort normal and breath sounds normal.  Neurological: She is alert and oriented to person, place, and time.  Skin: Skin is warm and dry.  Psychiatric: She has a normal mood and affect. Her behavior is normal.          Assessment & Plan:  Hypertension-home blood pressures look well controlled and look fantastic. She tractor pressures for 19 days and she only had 2 days where her pressure was mildly elevated. The rest of the blood pressures were mostly in the 120s and 130s. Continue current regimen. Follow-up in 4 months. Next  Insomnia-okay to use Ambien occasionally with the gabapentin. Encouraged her to try just a half of a tab especially since both these medications or sedatives. Monitor for any side effects.  Next  Anxiety-doing well with Effexor 37.5 mg. Refills sent for 1 year.   degenerative disc disease cervical-refilled her gabapentin for mail order.  She is also concerned because she also got yearly ultrasounds of her breasts with her mammograms. She came in with the exact diagnosis but this was started after she had a biopsy and she said the surgeon would order an ultrasound every year. We'll try to get back to her chart to see if we can figure out what they were following. Fibrocystic breast change of both breasts.

## 2015-12-17 ENCOUNTER — Telehealth: Payer: Self-pay | Admitting: Family Medicine

## 2015-12-17 NOTE — Telephone Encounter (Signed)
Please call patient. I did look back in her chart and I could see where she has been getting the breast US yearly at least sine 2012.  I still couldn't find the exact diagnosis. She mentioned she might have something on some paperwork at home.  We can also try to contact the surgeon who did her bx years ago and see if we can get the pathology report.

## 2015-12-21 ENCOUNTER — Telehealth: Payer: Self-pay | Admitting: Family Medicine

## 2015-12-21 NOTE — Telephone Encounter (Signed)
Please call patient: [facility] will not cover her Ambien. They will cover either trazodone or Rozerem. Please see if she has tried either one of these. I think the Rozerem is a good choice. It actually has very few side effects.

## 2015-12-21 NOTE — Telephone Encounter (Signed)
lvm informing pt of recommendations asked that she rtn call.Belinda Owens [place]

## 2015-12-21 NOTE — Telephone Encounter (Signed)
lvm w/recommendations. Advised to rtn call when she gets this information.Belinda Owens

## 2015-12-22 ENCOUNTER — Telehealth: Payer: Self-pay | Admitting: *Deleted

## 2015-12-22 NOTE — Telephone Encounter (Signed)
Pt stated that she would be ok with trying Rozerem.Belinda Owens

## 2015-12-22 NOTE — Telephone Encounter (Signed)
lvm for pt.Belinda Owens

## 2015-12-22 NOTE — Telephone Encounter (Signed)
Zolpidem has been denied by patient's insurance.denial letter placed in Dr. Gardiner Ramus box

## 2015-12-23 ENCOUNTER — Telehealth: Payer: Self-pay | Admitting: *Deleted

## 2015-12-23 MED ORDER — RAMELTEON 8 MG PO TABS: 8.0000 mg | ORAL_TABLET | Freq: Every day | ORAL | Status: DC

## 2015-12-23 NOTE — Telephone Encounter (Signed)
Patient called and states the Rozeram is too expensive.Over $300. She would like something else called in.

## 2015-12-23 NOTE — Telephone Encounter (Signed)
Notified patient that medication was sent to [facility].  [facility], spoke with [place] who transferred me to [facility] (medicare rep).  Cancelled the Rozerem.

## 2015-12-23 NOTE — Telephone Encounter (Signed)
I sent the Rozerem to gain weight and her mail order by accident. Please call the mail order to cancel the prescription. I'm assuming she wants to get it filled locally at [facility] at least for the first month.

## 2015-12-23 NOTE — Telephone Encounter (Signed)
Please call the pharmacy and find out what is going on.  Insurance wouldn't pay for the [place] and they told us rozerem is preferred. So not sure why they are quoting her cash price unless she has a med deductible.

## 2015-12-23 NOTE — Addendum Note (Signed)
Addended by: Beatrice Lecher D on: 12/23/2015 08:02 AM   Modules accepted: Orders

## 2015-12-27 NOTE — Telephone Encounter (Signed)
Called the pharmacy and the patient had a big deductable. Spoke with Carloyn Manner

## 2015-12-27 NOTE — Telephone Encounter (Signed)
OK, please call patient let her know that we checked with the pharmacy and she does have a very large deductible and not by the medications are running so high. She will have to decide what she wants to do or what she is willing to pay for. Unfortunately I cannot do anything about her deductible. That is through her insurance company.

## 2015-12-28 NOTE — Telephone Encounter (Signed)
Left message on patients vm

## 2015-12-29 ENCOUNTER — Telehealth: Payer: Self-pay

## 2015-12-29 ENCOUNTER — Other Ambulatory Visit: Payer: Self-pay

## 2015-12-29 ENCOUNTER — Other Ambulatory Visit: Payer: Self-pay | Admitting: *Deleted

## 2015-12-29 DIAGNOSIS — M7061 Trochanteric bursitis, right hip: Secondary | ICD-10-CM

## 2015-12-29 MED ORDER — RANITIDINE HCL 300 MG PO CAPS: 300.0000 mg | ORAL_CAPSULE | Freq: Every evening | ORAL | Status: DC

## 2015-12-29 MED ORDER — METOPROLOL SUCCINATE ER 25 MG PO TB24: 25.0000 mg | ORAL_TABLET | Freq: Every day | ORAL | Status: DC

## 2015-12-29 MED ORDER — TRAZODONE HCL 50 MG PO TABS: 25.0000 mg | ORAL_TABLET | Freq: Every evening | ORAL | Status: DC | PRN

## 2015-12-29 MED ORDER — MELOXICAM 15 MG PO TABS: ORAL_TABLET | ORAL | Status: DC

## 2015-12-29 NOTE — Telephone Encounter (Signed)
I sent a prescription but [facility]. We will need to cancel that and then send to [facility] for her to try. She will need to take it about 1-2 hours before bedtime

## 2015-12-29 NOTE — Telephone Encounter (Signed)
Patient called this am and did talk to her about her deductable. She states she will call her pharm vendor. I did suggest the patient call her insurance. The patient states she will call and see what she can find out and let me know

## 2015-12-29 NOTE — Telephone Encounter (Signed)
Patient advised. She actually wants the prescription [facility].

## 2015-12-29 NOTE — Telephone Encounter (Signed)
Belinda Owens states she cannot afford Rozerem. She would like to try the trazodone. Please advise.

## 2016-01-12 DIAGNOSIS — J324 Chronic pansinusitis: Secondary | ICD-10-CM | POA: Diagnosis not present

## 2016-01-25 ENCOUNTER — Ambulatory Visit (INDEPENDENT_AMBULATORY_CARE_PROVIDER_SITE_OTHER): Payer: Medicare Other | Admitting: Family Medicine

## 2016-01-25 ENCOUNTER — Encounter: Payer: Self-pay | Admitting: Family Medicine

## 2016-01-25 VITALS — BP 145/72 | HR 74 | Wt 173.0 lb

## 2016-01-25 DIAGNOSIS — G4700 Insomnia, unspecified: Secondary | ICD-10-CM

## 2016-01-25 DIAGNOSIS — I10 Essential (primary) hypertension: Secondary | ICD-10-CM | POA: Diagnosis not present

## 2016-01-25 MED ORDER — METOPROLOL SUCCINATE ER 100 MG PO TB24: 100.0000 mg | ORAL_TABLET | Freq: Every day | ORAL | Status: DC

## 2016-01-25 NOTE — Patient Instructions (Signed)
Move the metoprolol to night.

## 2016-01-25 NOTE — Progress Notes (Signed)
   Subjective:    Patient ID: Belinda Owens, female    DOB: 08-Jul-1932, 82 y.o.   MRN: [id number]  HPI F/U insomnia - She did try the trazodone. She says it makes her feel very groggy and out of it first thing in the morning. She's even tried taking half of a tab and she still doesn't feel refreshed when she wakes up. She was unable to get the Rozerem. It was recommended by her insurance plan as they will not cover the Ambien but the cost was going to be $300 and she says it's way out of her price range.  Here for blood pressure. She's been tracking at home and her blood pressures have been running up in the 170s over 90s. She denies any recent changes. I saw her about a month and a half ago and at that time her blood pressure was well controlled. She denies any dietary changes. She reports she has been up some more financial stressors recently   Review of Systems     Objective:   Physical Exam  Constitutional: She is oriented to person, place, and time. She appears well-developed and well-nourished.  HENT:  Head: Normocephalic and atraumatic.  Cardiovascular: Normal rate, regular rhythm and normal heart sounds.   Pulmonary/Chest: Effort normal and breath sounds normal.  Neurological: She is alert and oriented to person, place, and time.  Skin: Skin is warm and dry.  Psychiatric: She has a normal mood and affect. Her behavior is normal.          Assessment & Plan:  Insomnia - Will call the pharmacy and find out why the Rozerem was so expensive since it was recommended by her insurance company. She says she is Artie met her deductible for this year so that should not be a complicating factor. If it still quite expensive then we will consider doing an appeal for the Ambien which she has used for years. She just uses it as needed and says she wakes up refreshed without any side effects or sedation.  Hypertension-uncontrolled. Will increase metoprolol 200 mg. Also encouraged her to move it  to bedtime and take the losartan HCT in the morning. She will check her blood pressures in the next 2 weeks and give me a call. Otherwise I will see her back in one month.

## 2016-01-28 ENCOUNTER — Telehealth: Payer: Self-pay

## 2016-01-28 MED ORDER — AMLODIPINE BESYLATE 5 MG PO TABS: 5.0000 mg | ORAL_TABLET | Freq: Every day | ORAL | Status: DC

## 2016-01-28 NOTE — Telephone Encounter (Signed)
Call patient: Her blood pressure was still elevated but her pulse is a little on the low side. However decrease the metoprolol down to 50 mg. I think she currently has 100 mg tabs she can take a half. I am going to add a new prescription of amlodipine 5 mg once a day to her regimen. New prescription sent to [facility].

## 2016-01-28 NOTE — Telephone Encounter (Signed)
This is your patient you saw her 4/4. i think was accidentally routed to me.

## 2016-01-28 NOTE — Telephone Encounter (Signed)
Patient advised of recommendations.  

## 2016-01-28 NOTE — Telephone Encounter (Signed)
Left detailed message.   

## 2016-01-28 NOTE — Telephone Encounter (Signed)
Belinda Owens was advised to call with blood pressure and pulse readings. She left a message stating her blood pressure and her pulse today was 165/89 and 49 pulse.

## 2016-01-28 NOTE — Telephone Encounter (Signed)
To nurse

## 2016-02-01 ENCOUNTER — Telehealth: Payer: Self-pay

## 2016-02-01 ENCOUNTER — Telehealth: Payer: Self-pay | Admitting: *Deleted

## 2016-02-01 DIAGNOSIS — E785 Hyperlipidemia, unspecified: Secondary | ICD-10-CM

## 2016-02-01 MED ORDER — ATORVASTATIN CALCIUM 20 MG PO TABS: 20.0000 mg | ORAL_TABLET | Freq: Every day | ORAL | Status: DC

## 2016-02-01 NOTE — Telephone Encounter (Signed)
Ok to RF x 6 mo

## 2016-02-01 NOTE — Telephone Encounter (Signed)
OK, let initiate a PA for the ambien.

## 2016-02-01 NOTE — Telephone Encounter (Signed)
Pt still has a $350 deductible. I spoke with pharmacist at [facility] ( in re to coverage for Rozeram)

## 2016-02-01 NOTE — Telephone Encounter (Signed)
Sent to pharmacy 

## 2016-02-02 NOTE — Telephone Encounter (Signed)
Ok can you please send a rx to her pharm for the [place]?

## 2016-02-07 ENCOUNTER — Ambulatory Visit (INDEPENDENT_AMBULATORY_CARE_PROVIDER_SITE_OTHER): Payer: Medicare Other

## 2016-02-07 ENCOUNTER — Encounter: Payer: Self-pay | Admitting: Family Medicine

## 2016-02-07 ENCOUNTER — Ambulatory Visit (INDEPENDENT_AMBULATORY_CARE_PROVIDER_SITE_OTHER): Payer: Medicare Other | Admitting: Family Medicine

## 2016-02-07 VITALS — BP 112/69 | HR 81 | Wt 174.0 lb

## 2016-02-07 DIAGNOSIS — S99912A Unspecified injury of left ankle, initial encounter: Secondary | ICD-10-CM

## 2016-02-07 DIAGNOSIS — S0990XA Unspecified injury of head, initial encounter: Secondary | ICD-10-CM

## 2016-02-07 DIAGNOSIS — R42 Dizziness and giddiness: Secondary | ICD-10-CM | POA: Diagnosis not present

## 2016-02-07 DIAGNOSIS — M25572 Pain in left ankle and joints of left foot: Secondary | ICD-10-CM | POA: Diagnosis not present

## 2016-02-07 DIAGNOSIS — X58XXXA Exposure to other specified factors, initial encounter: Secondary | ICD-10-CM | POA: Diagnosis not present

## 2016-02-07 DIAGNOSIS — M7989 Other specified soft tissue disorders: Secondary | ICD-10-CM | POA: Diagnosis not present

## 2016-02-07 MED ORDER — ZOLPIDEM TARTRATE 5 MG PO TABS
5.0000 mg | ORAL_TABLET | Freq: Every evening | ORAL | Status: DC | PRN
Start: 2016-02-07 — End: 2016-05-04

## 2016-02-07 NOTE — Assessment & Plan Note (Signed)
Contusion. No evidence of intracranial abnormality on CT scan. Follow-up with PCP.

## 2016-02-07 NOTE — Patient Instructions (Signed)
Thank you for coming in today.  Head Injury, Adult You have received a head injury. It does not appear serious at this time. Headaches and vomiting are common following head injury. It should be easy to awaken from sleeping. Sometimes it is necessary for you to stay in the emergency department for a while for observation. Sometimes admission to the hospital may be needed. After injuries such as yours, most problems occur within the first 24 hours, but side effects may occur up to 7-10 days after the injury. It is important for you to carefully monitor your condition and contact your health care provider or seek immediate medical care if there is a change in your condition. WHAT ARE THE TYPES OF HEAD INJURIES? Head injuries can be as minor as a bump. Some head injuries can be more severe. More severe head injuries include:  A jarring injury to the brain (concussion).  A bruise of the brain (contusion). This mean there is bleeding in the brain that can cause swelling.  A cracked skull (skull fracture).  Bleeding in the brain that collects, clots, and forms a bump (hematoma). WHAT CAUSES A HEAD INJURY? A serious head injury is most likely to happen to someone who is in a car wreck and is not wearing a seat belt. Other causes of major head injuries include bicycle or motorcycle accidents, sports injuries, and falls. HOW ARE HEAD INJURIES DIAGNOSED? A complete history of the event leading to the injury and your current symptoms will be helpful in diagnosing head injuries. Many times, pictures of the brain, such as CT or MRI are needed to see the extent of the injury. Often, an overnight hospital stay is necessary for observation.  WHEN SHOULD I SEEK IMMEDIATE MEDICAL CARE?  You should get help right away if:  You have confusion or drowsiness.  You feel sick to your stomach (nauseous) or have continued, forceful vomiting.  You have dizziness or unsteadiness that is getting worse.  You have  severe, continued headaches not relieved by medicine. Only take over-the-counter or prescription medicines for pain, fever, or discomfort as directed by your health care provider.  You do not have normal function of the arms or legs or are unable to walk.  You notice changes in the black spots in the center of the colored part of your eye (pupil).  You have a clear or bloody fluid coming from your nose or ears.  You have a loss of vision. During the next 24 hours after the injury, you must stay with someone who can watch you for the warning signs. This person should contact local emergency services (911 in the [place] if you have seizures, you become unconscious, or you are unable to wake up. HOW CAN I PREVENT A HEAD INJURY IN THE FUTURE? The most important factor for preventing major head injuries is avoiding motor vehicle accidents. To minimize the potential for damage to your head, it is crucial to wear seat belts while riding in motor vehicles. Wearing helmets while bike riding and playing collision sports (like football) is also helpful. Also, avoiding dangerous activities around the house will further help reduce your risk of head injury.  WHEN CAN I RETURN TO NORMAL ACTIVITIES AND ATHLETICS? You should be reevaluated by your health care provider before returning to these activities. If you have any of the following symptoms, you should not return to activities or contact sports until 1 week after the symptoms have stopped:  Persistent headache.  Dizziness or vertigo.  Poor attention and concentration.  Confusion.  Memory problems.  Nausea or vomiting.  Fatigue or tire easily.  Irritability.  Intolerant of bright lights or loud noises.  Anxiety or depression.  Disturbed sleep. MAKE SURE YOU:   Understand these instructions.  Will watch your condition.  Will get help right away if you are not doing well or get worse.   This information is not intended to replace advice  given to you by your health care provider. Make sure you discuss any questions you have with your health care provider.   Document Released: 10/09/2005 Document Revised: 10/30/2014 Document Reviewed: 06/16/2013 Elsevier Interactive Patient Education 2016 [employer].   Acute Ankle Sprain With Phase I Rehab An acute ankle sprain is a partial or complete tear in one or more of the ligaments of the ankle due to traumatic injury. The severity of the injury depends on both the number of ligaments sprained and the grade of sprain. There are 3 grades of sprains.   A grade 1 sprain is a mild sprain. There is a slight pull without obvious tearing. There is no loss of strength, and the muscle and ligament are the correct length.  A grade 2 sprain is a moderate sprain. There is tearing of fibers within the substance of the ligament where it connects two bones or two cartilages. The length of the ligament is increased, and there is usually decreased strength.  A grade 3 sprain is a complete rupture of the ligament and is uncommon. In addition to the grade of sprain, there are three types of ankle sprains.  Lateral ankle sprains: This is a sprain of one or more of the three ligaments on the outer side (lateral) of the ankle. These are the most common sprains. Medial ankle sprains: There is one large triangular ligament of the inner side (medial) of the ankle that is susceptible to injury. Medial ankle sprains are less common. Syndesmosis, "high ankle," sprains: The syndesmosis is the ligament that connects the two bones of the lower leg. Syndesmosis sprains usually only occur with very severe ankle sprains. SYMPTOMS  Pain, tenderness, and swelling in the ankle, starting at the side of injury that may progress to the whole ankle and foot with time.  "Pop" or tearing sensation at the time of injury.  Bruising that may spread to the heel.  Impaired ability to walk soon after injury. CAUSES   Acute  ankle sprains are caused by trauma placed on the ankle that temporarily forces or pries the anklebone (talus) out of its normal socket.  Stretching or tearing of the ligaments that normally hold the joint in place (usually due to a twisting injury). RISK INCREASES WITH:  Previous ankle sprain.  Sports in which the foot may land awkwardly (i.e., basketball, volleyball, or soccer) or walking or running on uneven or rough surfaces.  Shoes with inadequate support to prevent sideways motion when stress occurs.  Poor strength and flexibility.  Poor balance skills.  Contact sports. PREVENTION   Warm up and stretch properly before activity.  Maintain physical fitness:  Ankle and leg flexibility, muscle strength, and endurance.  Cardiovascular fitness.  Balance training activities.  Use proper technique and have a coach correct improper technique.  Taping, protective strapping, bracing, or high-top tennis shoes may help prevent injury. Initially, tape is best; however, it loses most of its support function within 10 to 15 minutes.  Wear proper-fitted protective shoes (High-top shoes with taping or bracing is more effective than either  alone).  Provide the ankle with support during sports and practice activities for 12 months following injury. PROGNOSIS   If treated properly, ankle sprains can be expected to recover completely; however, the length of recovery depends on the degree of injury.  A grade 1 sprain usually heals enough in 5 to 7 days to allow modified activity and requires an average of 6 weeks to heal completely.  A grade 2 sprain requires 6 to 10 weeks to heal completely.  A grade 3 sprain requires 12 to 16 weeks to heal.  A syndesmosis sprain often takes more than 3 months to heal. RELATED COMPLICATIONS   Frequent recurrence of symptoms may result in a chronic problem. Appropriately addressing the problem the first time decreases the frequency of recurrence and  optimizes healing time. Severity of the initial sprain does not predict the likelihood of later instability.  Injury to other structures (bone, cartilage, or tendon).  A chronically unstable or arthritic ankle joint is a possibility with repeated sprains. TREATMENT Treatment initially involves the use of ice, medication, and compression bandages to help reduce pain and inflammation. Ankle sprains are usually immobilized in a walking cast or boot to allow for healing. Crutches may be recommended to reduce pressure on the injury. After immobilization, strengthening and stretching exercises may be necessary to regain strength and a full range of motion. Surgery is rarely needed to treat ankle sprains. MEDICATION   Nonsteroidal anti-inflammatory medications, such as aspirin and ibuprofen (do not take for the first 3 days after injury or within 7 days before surgery), or other minor pain relievers, such as acetaminophen, are often recommended. Take these as directed by your caregiver. Contact your caregiver immediately if any bleeding, stomach upset, or signs of an allergic reaction occur from these medications.  Ointments applied to the skin may be helpful.  Pain relievers may be prescribed as necessary by your caregiver. Do not take prescription pain medication for longer than 4 to 7 days. Use only as directed and only as much as you need. HEAT AND COLD  Cold treatment (icing) is used to relieve pain and reduce inflammation for acute and chronic cases. Cold should be applied for 10 to 15 minutes every 2 to 3 hours for inflammation and pain and immediately after any activity that aggravates your symptoms. Use ice packs or an ice massage.  Heat treatment may be used before performing stretching and strengthening activities prescribed by your caregiver. Use a heat pack or a warm soak. SEEK IMMEDIATE MEDICAL CARE IF:   Pain, swelling, or bruising worsens despite treatment.  You experience pain,  numbness, discoloration, or coldness in the foot or toes.  New, unexplained symptoms develop (drugs used in treatment may produce side effects.) EXERCISES  PHASE I EXERCISES RANGE OF MOTION (ROM) AND STRETCHING EXERCISES - Ankle Sprain, Acute Phase I, Weeks 1 to 2 These exercises may help you when beginning to restore flexibility in your ankle. You will likely work on these exercises for the 1 to 2 weeks after your injury. Once your physician, physical therapist, or athletic trainer sees adequate progress, he or she will advance your exercises. While completing these exercises, remember:   Restoring tissue flexibility helps normal motion to return to the joints. This allows healthier, less painful movement and activity.  An effective stretch should be held for at least 30 seconds.  A stretch should never be painful. You should only feel a gentle lengthening or release in the stretched tissue. RANGE OF MOTION -  Dorsi/Plantar Flexion  While sitting with your right / left knee straight, draw the top of your foot upwards by flexing your ankle. Then reverse the motion, pointing your toes downward.  Hold each position for __________ seconds.  After completing your first set of exercises, repeat this exercise with your knee bent. Repeat __________ times. Complete this exercise __________ times per day.  RANGE OF MOTION - Ankle Alphabet  Imagine your right / left big toe is a pen.  Keeping your hip and knee still, write out the entire alphabet with your "pen." Make the letters as large as you can without increasing any discomfort. Repeat __________ times. Complete this exercise __________ times per day.  STRENGTHENING EXERCISES - Ankle Sprain, Acute -Phase I, Weeks 1 to 2 These exercises may help you when beginning to restore strength in your ankle. You will likely work on these exercises for 1 to 2 weeks after your injury. Once your physician, physical therapist, or athletic trainer sees  adequate progress, he or she will advance your exercises. While completing these exercises, remember:   Muscles can gain both the endurance and the strength needed for everyday activities through controlled exercises.  Complete these exercises as instructed by your physician, physical therapist, or athletic trainer. Progress the resistance and repetitions only as guided.  You may experience muscle soreness or fatigue, but the pain or discomfort you are trying to eliminate should never worsen during these exercises. If this pain does worsen, stop and make certain you are following the directions exactly. If the pain is still present after adjustments, discontinue the exercise until you can discuss the trouble with your clinician. STRENGTH - Dorsiflexors  Secure a rubber exercise band/tubing to a fixed object (i.e., table, pole) and loop the other end around your right / left foot.  Sit on the floor facing the fixed object. The band/tubing should be slightly tense when your foot is relaxed.  Slowly draw your foot back toward you using your ankle and toes.  Hold this position for __________ seconds. Slowly release the tension in the band and return your foot to the starting position. Repeat __________ times. Complete this exercise __________ times per day.  STRENGTH - Plantar-flexors   Sit with your right / left leg extended. Holding onto both ends of a rubber exercise band/tubing, loop it around the ball of your foot. Keep a slight tension in the band.  Slowly push your toes away from you, pointing them downward.  Hold this position for __________ seconds. Return slowly, controlling the tension in the band/tubing. Repeat __________ times. Complete this exercise __________ times per day.  STRENGTH - Ankle Eversion  Secure one end of a rubber exercise band/tubing to a fixed object (table, pole). Loop the other end around your foot just before your toes.  Place your fists between your knees.  This will focus your strengthening at your ankle.  Drawing the band/tubing across your opposite foot, slowly, pull your little toe out and up. Make sure the band/tubing is positioned to resist the entire motion.  Hold this position for __________ seconds. Have your muscles resist the band/tubing as it slowly pulls your foot back to the starting position.  Repeat __________ times. Complete this exercise __________ times per day.  STRENGTH - Ankle Inversion  Secure one end of a rubber exercise band/tubing to a fixed object (table, pole). Loop the other end around your foot just before your toes.  Place your fists between your knees. This will focus your strengthening  at your ankle.  Slowly, pull your big toe up and in, making sure the band/tubing is positioned to resist the entire motion.  Hold this position for __________ seconds.  Have your muscles resist the band/tubing as it slowly pulls your foot back to the starting position. Repeat __________ times. Complete this exercises __________ times per day.  STRENGTH - Towel Curls  Sit in a chair positioned on a non-carpeted surface.  Place your right / left foot on a towel, keeping your heel on the floor.  Pull the towel toward your heel by only curling your toes. Keep your heel on the floor.  If instructed by your physician, physical therapist, or athletic trainer, add weight to the end of the towel. Repeat __________ times. Complete this exercise __________ times per day.   This information is not intended to replace advice given to you by your health care provider. Make sure you discuss any questions you have with your health care provider.   Document Released: 05/10/2005 Document Revised: 10/30/2014 Document Reviewed: 01/21/2009 Elsevier Interactive Patient Education [employer].

## 2016-02-07 NOTE — Assessment & Plan Note (Signed)
Ankle sprain. Offered ankle brace patient declined. Plan for home exercise program. Return as needed.

## 2016-02-07 NOTE — Telephone Encounter (Signed)
Script printed.

## 2016-02-07 NOTE — Progress Notes (Signed)
Belinda Owens is a 82 y.o. female who presents to [facility] today for head injury and left ankle pain.  Patient slipped and fell in her bathroom last night hitting her posterior head and injuring her left ankle.  Head: Patient hit the back of her head. She denies any loss of consciousness. She denies any fogginess fuzziness and feels well. She thinks she is acting and speaking normally. She denies any loss of balance. She said her head in the past without any serious ramifications. She denies any bleeding.  Left ankle: Patient inverted her left ankle during a fall. She notes pain in the lateral aspect of her ankle cyst with some swelling. She is able to walk with a mild limp. She has not tried any medications for pain yet.   Past Medical History  Diagnosis Date  . Fibrocystic breast changes of both breasts     Gets yearly mammo and [place]  . History of rheumatic fever    Past Surgical History  Procedure Laterality Date  . Breast biopsy    . Meniscus repair Right   . Rotator cuff repair Right    Social History  Substance Use Topics  . Smoking status: Former Smoker    Types: Cigarettes    Quit date: 10/23/1957  . Smokeless tobacco: Not on file  . Alcohol Use: Not on file   family history includes Asthma in her daughter; Breast cancer in her maternal aunt; Colon cancer in her father.  ROS:  No headache, visual changes, nausea, vomiting, diarrhea, constipation, dizziness, abdominal pain, skin rash, fevers, chills, night sweats, weight loss, swollen lymph nodes, body aches, joint swelling, muscle aches, chest pain, shortness of breath, mood changes, visual or auditory hallucinations.    Medications: Current Outpatient Prescriptions  Medication Sig Dispense Refill  . albuterol (PROAIR HFA) 108 (90 BASE) MCG/ACT inhaler Inhale 2 puffs into the lungs every 6 (six) hours as needed for wheezing or shortness of breath. 3 Inhaler 2  .  amLODipine (NORVASC) 5 MG tablet Take 1 tablet (5 mg total) by mouth daily. 30 tablet 1  . aspirin 81 MG tablet Take 81 mg by mouth daily.    Marland Kitchen atorvastatin (LIPITOR) 20 MG tablet Take 1 tablet (20 mg total) by mouth at bedtime. 90 tablet 1  . gabapentin (NEURONTIN) 300 MG capsule Take 1 capsule (300 mg total) by mouth 3 (three) times daily. 270 capsule 3  . losartan-hydrochlorothiazide (HYZAAR) 100-25 MG tablet Take 1 tablet by mouth daily. 90 tablet 1  . meloxicam (MOBIC) 15 MG tablet One tab PO qAM with breakfast for 2 weeks, then daily prn pain. 90 tablet 0  . metoprolol succinate (TOPROL-XL) 100 MG 24 hr tablet Take 1 tablet (100 mg total) by mouth daily. 90 tablet 1  . montelukast (SINGULAIR) 10 MG tablet Take 1 tablet (10 mg total) by mouth daily. 90 tablet 3  . ranitidine (ZANTAC) 300 MG capsule Take 1 capsule (300 mg total) by mouth every evening. 90 capsule 4  . traZODone (DESYREL) 50 MG tablet Take 0.5-2 tablets (25-100 mg total) by mouth at bedtime as needed for sleep. 60 tablet 1  . venlafaxine (EFFEXOR) 37.5 MG tablet Take 1 tablet (37.5 mg total) by mouth daily. 90 tablet 3  . zolpidem (AMBIEN) 5 MG tablet Take 1 tablet (5 mg total) by mouth at bedtime as needed for sleep. 30 tablet 3   No current facility-administered medications for this visit.   Allergies  Allergen  Reactions  . Tylenol With Codeine #3  [Acetaminophen-Codeine] Nausea And Vomiting  . Atenolol Other (See Comments)    Headache  . Fluoxetine Other (See Comments)  . Omeprazole Itching     Exam:  BP 112/69 mmHg  Pulse 81  Wt 174 lb (78.926 kg) General: Well Developed, well nourished, and in no acute distress.  Neuro/Psych: Alert and oriented x3, extra-ocular muscles intact, able to move all 4 extremities, sensation grossly intact. Skin: Warm and dry, no rashes noted.  Respiratory: Not using accessory muscles, speaking in full sentences, trachea midline.  Cardiovascular: Pulses palpable, no extremity  edema. Abdomen: Does not appear distended. MSK: Left ankle is somewhat swollen and tender laterally. Normal motion stable ligamentous exam. Neuro alert and oriented normal speech thought process and affect. Normal motion balance and sensation. Scalp: Hematoma posterior scalp. Mildly tender. No bleeding.   No results found for this or any previous visit (from the past 24 hour(s)). Dg Ankle Complete Left  02/07/2016  CLINICAL DATA:  Status post slip and fall in a bathroom today with a left ankle injury. Swelling and pain. Initial encounter. EXAM: LEFT ANKLE COMPLETE - 3+ VIEW COMPARISON:  None. FINDINGS: There is no evidence of fracture, dislocation, or joint effusion. There is no evidence of arthropathy or other focal bone abnormality. Soft tissues are unremarkable. IMPRESSION: Negative exam. Electronically Signed   By: Inge Rise M.D.   On: 02/07/2016 15:39   Ct Head Wo Contrast  02/07/2016  CLINICAL DATA:  Tripped and fell at 0100 hours this morning and struck back of head on baseboard, RIGHT occipital pain, some dizziness, head injury, initial encounter EXAM: CT HEAD WITHOUT CONTRAST TECHNIQUE: Contiguous axial images were obtained from the base of the skull through the vertex without intravenous contrast. COMPARISON:  None FINDINGS: Normal ventricular morphology. No midline shift or mass effect. Normal appearance of brain parenchyma. No intracranial hemorrhage, mass lesion, or acute infarction. Visualized paranasal sinuses and mastoid air cells clear. Bones unremarkable. IMPRESSION: Normal exam. Electronically Signed   By: Lavonia Dana M.D.   On: 02/07/2016 15:21     Please see individual assessment and plan sections.

## 2016-02-08 ENCOUNTER — Other Ambulatory Visit: Payer: Self-pay | Admitting: Family Medicine

## 2016-02-08 NOTE — Progress Notes (Signed)
Quick Note:  Normal, no changes. ______ 

## 2016-02-09 ENCOUNTER — Other Ambulatory Visit: Payer: Self-pay

## 2016-02-09 DIAGNOSIS — E785 Hyperlipidemia, unspecified: Secondary | ICD-10-CM

## 2016-02-09 MED ORDER — ATORVASTATIN CALCIUM 20 MG PO TABS: 20.0000 mg | ORAL_TABLET | Freq: Every day | ORAL | Status: DC

## 2016-02-17 ENCOUNTER — Ambulatory Visit: Payer: Medicare Other | Admitting: Family Medicine

## 2016-02-18 ENCOUNTER — Encounter: Payer: Self-pay | Admitting: Sports Medicine

## 2016-02-18 ENCOUNTER — Ambulatory Visit (INDEPENDENT_AMBULATORY_CARE_PROVIDER_SITE_OTHER): Payer: Medicare Other | Admitting: Sports Medicine

## 2016-02-18 VITALS — BP 115/71 | HR 59 | Resp 16 | Wt 175.9 lb

## 2016-02-18 DIAGNOSIS — S99912D Unspecified injury of left ankle, subsequent encounter: Secondary | ICD-10-CM | POA: Diagnosis not present

## 2016-02-18 DIAGNOSIS — M7061 Trochanteric bursitis, right hip: Secondary | ICD-10-CM | POA: Diagnosis not present

## 2016-02-18 NOTE — Assessment & Plan Note (Signed)
Sprain but there is an effusion, and there is mild degenerative change on x-ray. Ankle joint injection as above, return in one month.

## 2016-02-18 NOTE — Addendum Note (Signed)
Addended by: [facility] on: 02/18/2016 11:52 AM   Modules accepted: Orders, Medications

## 2016-02-18 NOTE — Progress Notes (Signed)
  Subjective:    CC: hip and ankle pain  HPI: Right trochanteric bursitis: Previous injection was 4 months ago, desires a repeat. Pain is moderate, persistent, localized without radiation.  Left ankle pain: Minimal degenerative changes on x-ray, some pain over the ATFL. Significant swelling.  Past medical history, Surgical history, Family history not pertinant except as noted below, Social history, Allergies, and medications have been entered into the medical record, reviewed, and no changes needed.   Review of Systems: No fevers, chills, night sweats, weight loss, chest pain, or shortness of breath.   Objective:    General: Well Developed, well nourished, and in no acute distress.  Neuro: Alert and oriented x3, extra-ocular muscles intact, sensation grossly intact.  HEENT: Normocephalic, atraumatic, pupils equal round reactive to light, neck supple, no masses, no lymphadenopathy, thyroid nonpalpable.  Skin: Warm and dry, no rashes. Cardiac: Regular rate and rhythm, no murmurs rubs or gallops, no lower extremity edema.  Respiratory: Clear to auscultation bilaterally. Not using accessory muscles, speaking in full sentences. Left Ankle: Visibly swollen with tenderness over the talocrural joint, ATFL, and a palpable joint effusion. Range of motion is full in all directions. Strength is 5/5 in all directions. Stable lateral and medial ligaments; squeeze test and kleiger test unremarkable; Talar dome nontender; No pain at base of 5th MT; No tenderness over cuboid; No tenderness over N spot or navicular prominence No tenderness on posterior aspects of lateral and medial malleolus No sign of peroneal tendon subluxations; Negative tarsal tunnel tinel's Able to walk 4 steps.  Procedure: Real-time Ultrasound Guided Injection of right greater trochanteric bursa Device: GE Logiq E  Verbal informed consent obtained.  Time-out conducted.  Noted no overlying erythema, induration, or other  signs of local infection.  Skin prepped in a sterile fashion.  Local anesthesia: Topical Ethyl chloride.  With sterile technique and under real time ultrasound guidance:  Using a spinal needle advanced through the gluteus medius to the greater trochanter, and 1 mL kenalog 40, 2 mL lidocaine, 2 mL Marcaine injected easily. Completed without difficulty  Pain immediately resolved suggesting accurate placement of the medication.  Advised to call if fevers/chills, erythema, induration, drainage, or persistent bleeding.  Images permanently stored and available for review in the ultrasound unit.  Impression: Technically successful ultrasound guided injection.  Procedure: Real-time Ultrasound Guided Injection of left tibiotalar joint Device: GE Logiq E  Verbal informed consent obtained.  Time-out conducted.  Noted no overlying erythema, induration, or other signs of local infection.  Skin prepped in a sterile fashion.  Local anesthesia: Topical Ethyl chloride.  With sterile technique and under real time ultrasound guidance:  Needle advanced into joint space and 1 mL kenalog 40, 1 mL lidocaine, 1 mL Marcaine injected easily. Completed without difficulty  Pain immediately resolved suggesting accurate placement of the medication.  Advised to call if fevers/chills, erythema, induration, drainage, or persistent bleeding.  Images permanently stored and available for review in the ultrasound unit.  Impression: Technically successful ultrasound guided injection.  Impression and Recommendations:

## 2016-02-18 NOTE — Assessment & Plan Note (Signed)
Repeat injection, previous one was 4 months ago.

## 2016-02-23 DIAGNOSIS — M9901 Segmental and somatic dysfunction of cervical region: Secondary | ICD-10-CM | POA: Diagnosis not present

## 2016-02-23 DIAGNOSIS — M9902 Segmental and somatic dysfunction of thoracic region: Secondary | ICD-10-CM | POA: Diagnosis not present

## 2016-02-23 DIAGNOSIS — M5413 Radiculopathy, cervicothoracic region: Secondary | ICD-10-CM | POA: Diagnosis not present

## 2016-02-23 DIAGNOSIS — M5032 Other cervical disc degeneration, mid-cervical region, unspecified level: Secondary | ICD-10-CM | POA: Diagnosis not present

## 2016-02-25 ENCOUNTER — Other Ambulatory Visit: Payer: Self-pay | Admitting: Family Medicine

## 2016-03-17 ENCOUNTER — Ambulatory Visit (INDEPENDENT_AMBULATORY_CARE_PROVIDER_SITE_OTHER): Payer: Medicare Other | Admitting: Sports Medicine

## 2016-03-17 ENCOUNTER — Encounter: Payer: Self-pay | Admitting: Sports Medicine

## 2016-03-17 VITALS — BP 115/66 | HR 53 | Resp 16 | Wt 175.3 lb

## 2016-03-17 DIAGNOSIS — M7061 Trochanteric bursitis, right hip: Secondary | ICD-10-CM | POA: Diagnosis not present

## 2016-03-17 DIAGNOSIS — S99912D Unspecified injury of left ankle, subsequent encounter: Secondary | ICD-10-CM | POA: Diagnosis not present

## 2016-03-17 DIAGNOSIS — J4520 Mild intermittent asthma, uncomplicated: Secondary | ICD-10-CM

## 2016-03-17 MED ORDER — AZITHROMYCIN 250 MG PO TABS: ORAL_TABLET | ORAL | Status: DC

## 2016-03-17 NOTE — Assessment & Plan Note (Signed)
Resolved after injection 

## 2016-03-17 NOTE — Progress Notes (Signed)
  Subjective:    CC: Follow-up  HPI: Ankle pain: Resolved after injection  Sinus infection: Desires Z-Pak.  Trochanteric bursitis: Resolved after injection  Past medical history, Surgical history, Family history not pertinant except as noted below, Social history, Allergies, and medications have been entered into the medical record, reviewed, and no changes needed.   Review of Systems: No fevers, chills, night sweats, weight loss, chest pain, or shortness of breath.   Objective:    General: Well Developed, well nourished, and in no acute distress.  Neuro: Alert and oriented x3, extra-ocular muscles intact, sensation grossly intact.  HEENT: Normocephalic, atraumatic, pupils equal round reactive to light, neck supple, no masses, no lymphadenopathy, thyroid nonpalpable.  Skin: Warm and dry, no rashes. Cardiac: Regular rate and rhythm, no murmurs rubs or gallops, no lower extremity edema.  Respiratory: Clear to auscultation bilaterally. Not using accessory muscles, speaking in full sentences.  Impression and Recommendations:    I spent 25 minutes with this patient, greater than 50% was face-to-face time counseling regarding the above diagnoses

## 2016-03-17 NOTE — Assessment & Plan Note (Signed)
Pain resolved after talocrural joint injection at the last visit.

## 2016-03-17 NOTE — Assessment & Plan Note (Signed)
Currently with acute sinusitis. Adding azithromycin, she will follow up with her primary care provider regarding this.

## 2016-03-22 ENCOUNTER — Other Ambulatory Visit: Payer: Self-pay

## 2016-03-22 DIAGNOSIS — I10 Essential (primary) hypertension: Secondary | ICD-10-CM

## 2016-03-22 MED ORDER — AMLODIPINE BESYLATE 5 MG PO TABS: ORAL_TABLET | ORAL | Status: DC

## 2016-03-28 DIAGNOSIS — H40003 Preglaucoma, unspecified, bilateral: Secondary | ICD-10-CM | POA: Diagnosis not present

## 2016-04-14 ENCOUNTER — Ambulatory Visit: Payer: Medicare Other | Admitting: Family Medicine

## 2016-04-19 DIAGNOSIS — K118 Other diseases of salivary glands: Secondary | ICD-10-CM | POA: Diagnosis not present

## 2016-04-19 DIAGNOSIS — R070 Pain in throat: Secondary | ICD-10-CM | POA: Diagnosis not present

## 2016-04-21 ENCOUNTER — Ambulatory Visit: Payer: Medicare Other | Admitting: Family Medicine

## 2016-05-04 ENCOUNTER — Ambulatory Visit (INDEPENDENT_AMBULATORY_CARE_PROVIDER_SITE_OTHER): Payer: Medicare Other | Admitting: Family Medicine

## 2016-05-04 ENCOUNTER — Encounter: Payer: Self-pay | Admitting: Family Medicine

## 2016-05-04 VITALS — BP 142/75 | HR 67 | Wt 177.0 lb

## 2016-05-04 DIAGNOSIS — F411 Generalized anxiety disorder: Secondary | ICD-10-CM | POA: Diagnosis not present

## 2016-05-04 DIAGNOSIS — I10 Essential (primary) hypertension: Secondary | ICD-10-CM

## 2016-05-04 DIAGNOSIS — G4700 Insomnia, unspecified: Secondary | ICD-10-CM

## 2016-05-04 MED ORDER — ZOLPIDEM TARTRATE 5 MG PO TABS: 5.0000 mg | ORAL_TABLET | Freq: Every evening | ORAL | Status: DC | PRN

## 2016-05-04 MED ORDER — SERTRALINE HCL 50 MG PO TABS: ORAL_TABLET | ORAL | Status: DC

## 2016-05-04 NOTE — Progress Notes (Signed)
Subjective:    CC:  HTN  HPI: Hypertension- Pt denies chest pain, SOB, dizziness, or heart palpitations.  Taking meds as directed w/o problems.  Denies medication side effects.    F/U Anxiety - She's been under a lot of stress recently. Her good friend of hers has been undergoing chemotherapy for breast cancer. She has actually been driving her to her appointments on Fridays. Unfortunately her friend has not done well with chemotherapy and was recently hospitalized for pneumonia. She is currently on Effexor but feels like it's not really doing a lot.   Insomnia-she would really like to restart her Ambien. She knows it will not be covered by her insurance and does understand the potential risks with taking this medication. She previously was on 5 mg daily.  Past medical history, Surgical history, Family history not pertinant except as noted below, Social history, Allergies, and medications have been entered into the medical record, reviewed, and corrections made.   Review of Systems: No fevers, chills, night sweats, weight loss, chest pain, or shortness of breath.   Objective:    General: Well Developed, well nourished, and in no acute distress.  Neuro: Alert and oriented x3, extra-ocular muscles intact, sensation grossly intact.  HEENT: Normocephalic, atraumatic, no cervical LN  Skin: Warm and dry, no rashes. Cardiac: Regular rate and rhythm, no murmurs rubs or gallops, no lower extremity edema.  Respiratory: Clear to auscultation bilaterally. Not using accessory muscles, speaking in full sentences.   Impression and Recommendations:   HTN - Blood pressure uncontrolled today but she felt like it was up because she was rushing. She says she does usually check it at home and her blood pressures there look good. I will actually be seeing her back in about 2 months to follow-up on her mood so we can recheck her pressure at that time.  Anxiety - GAD- 7 score of  8 today. She rates her symptoms  as not difficult but she is interested in changing her medication. She took Prozac years ago and did not do well with it so we will try sertraline. Follow-up in 2 months to see how she's doing. Taper written down for her.  Insomnia - will restart Ambien. She is well aware of the potential side effects.

## 2016-05-04 NOTE — Patient Instructions (Signed)
Decrease Effexor to every other day for 10 days, then stop and start the sertraline the next day. He will start with half a tab of the sertraline for 5 days and then go up to a whole tablet if tolerated well.

## 2016-06-01 DIAGNOSIS — Z800 Family history of malignant neoplasm of digestive organs: Secondary | ICD-10-CM | POA: Diagnosis not present

## 2016-06-01 DIAGNOSIS — D369 Benign neoplasm, unspecified site: Secondary | ICD-10-CM | POA: Diagnosis not present

## 2016-06-02 ENCOUNTER — Other Ambulatory Visit: Payer: Self-pay

## 2016-06-02 MED ORDER — LOSARTAN POTASSIUM-HCTZ 100-25 MG PO TABS: 1.0000 | ORAL_TABLET | Freq: Every day | ORAL | 0 refills | Status: DC

## 2016-06-07 DIAGNOSIS — D132 Benign neoplasm of duodenum: Secondary | ICD-10-CM | POA: Diagnosis not present

## 2016-06-08 ENCOUNTER — Encounter: Payer: Self-pay | Admitting: Sports Medicine

## 2016-06-08 ENCOUNTER — Ambulatory Visit (INDEPENDENT_AMBULATORY_CARE_PROVIDER_SITE_OTHER): Payer: Medicare Other | Admitting: Sports Medicine

## 2016-06-08 DIAGNOSIS — M7061 Trochanteric bursitis, right hip: Secondary | ICD-10-CM | POA: Diagnosis not present

## 2016-06-08 DIAGNOSIS — M1712 Unilateral primary osteoarthritis, left knee: Secondary | ICD-10-CM

## 2016-06-08 NOTE — Progress Notes (Signed)
  Subjective:    CC: Hip and knee pain  HPI: Left knee pain: History of meniscal tear, degenerative, has had injections in the distant past, now having a recurrence of swelling and pain, worse at the medial joint line. Moderate, persistent without radiation, no overt mechanical symptoms. Desires injection today.  Right trochanteric bursitis: With injected this in the past, having a recurrence of pain and desires repeat interventional treatment today, pain is moderate, persistent, localized over the greater trochanter without radiation.  Past medical history, Surgical history, Family history not pertinant except as noted below, Social history, Allergies, and medications have been entered into the medical record, reviewed, and no changes needed.   Review of Systems: No fevers, chills, night sweats, weight loss, chest pain, or shortness of breath.   Objective:    General: Well Developed, well nourished, and in no acute distress.  Neuro: Alert and oriented x3, extra-ocular muscles intact, sensation grossly intact.  HEENT: Normocephalic, atraumatic, pupils equal round reactive to light, neck supple, no masses, no lymphadenopathy, thyroid nonpalpable.  Skin: Warm and dry, no rashes. Cardiac: Regular rate and rhythm, no murmurs rubs or gallops, no lower extremity edema.  Respiratory: Clear to auscultation bilaterally. Not using accessory muscles, speaking in full sentences.  Procedure: Real-time Ultrasound Guided Injection of left knee Device: GE Logiq E  Verbal informed consent obtained.  Time-out conducted.  Noted no overlying erythema, induration, or other signs of local infection.  Skin prepped in a sterile fashion.  Local anesthesia: Topical Ethyl chloride.  With sterile technique and under real time ultrasound guidance:  1 mL kenalog 40, 2 mL lidocaine, 2 mL Marcaine injected easily. Completed without difficulty  Pain immediately resolved suggesting accurate placement of the medication.   Advised to call if fevers/chills, erythema, induration, drainage, or persistent bleeding.  Images permanently stored and available for review in the ultrasound unit.  Impression: Technically successful ultrasound guided injection.  Procedure: Real-time Ultrasound Guided Injection of right greater trochanteric bursa Device: GE Logiq E  Verbal informed consent obtained.  Time-out conducted.  Noted no overlying erythema, induration, or other signs of local infection.  Skin prepped in a sterile fashion.  Local anesthesia: Topical Ethyl chloride.  With sterile technique and under real time ultrasound guidance:  Spinal needle advanced to the greater trochanteric bursa, 1 mL Kenalog 40, 2 mL lidocaine, 2 mL Marcaine injected easily. Completed without difficulty  Pain immediately resolved suggesting accurate placement of the medication.  Advised to call if fevers/chills, erythema, induration, drainage, or persistent bleeding.  Images permanently stored and available for review in the ultrasound unit.  Impression: Technically successful ultrasound guided injection.  Impression and Recommendations:    Trochanteric bursitis of right hip Ultrasound guided injection as above, return in one month.  Primary osteoarthritis of left knee Left knee injection as above, return in one month.  I spent 25 minutes with this patient, greater than 50% was face-to-face time counseling regarding the above diagnoses, this was separate from the time spent performing the above procedures

## 2016-06-08 NOTE — Assessment & Plan Note (Signed)
Left knee injection as above, return in one month.

## 2016-06-08 NOTE — Assessment & Plan Note (Signed)
Ultrasound guided injection as above, return in one month.

## 2016-06-22 ENCOUNTER — Other Ambulatory Visit: Payer: Self-pay | Admitting: *Deleted

## 2016-06-22 DIAGNOSIS — I10 Essential (primary) hypertension: Secondary | ICD-10-CM

## 2016-06-22 MED ORDER — AMLODIPINE BESYLATE 5 MG PO TABS: ORAL_TABLET | ORAL | 0 refills | Status: DC

## 2016-07-06 ENCOUNTER — Encounter: Payer: Self-pay | Admitting: Sports Medicine

## 2016-07-06 ENCOUNTER — Ambulatory Visit (INDEPENDENT_AMBULATORY_CARE_PROVIDER_SITE_OTHER): Payer: Medicare Other | Admitting: Sports Medicine

## 2016-07-06 ENCOUNTER — Encounter: Payer: Self-pay | Admitting: Family Medicine

## 2016-07-06 ENCOUNTER — Telehealth: Payer: Self-pay | Admitting: Family Medicine

## 2016-07-06 ENCOUNTER — Ambulatory Visit (INDEPENDENT_AMBULATORY_CARE_PROVIDER_SITE_OTHER): Payer: Medicare Other | Admitting: Family Medicine

## 2016-07-06 VITALS — BP 136/69 | HR 63 | Wt 174.0 lb

## 2016-07-06 DIAGNOSIS — Z23 Encounter for immunization: Secondary | ICD-10-CM

## 2016-07-06 DIAGNOSIS — M7061 Trochanteric bursitis, right hip: Secondary | ICD-10-CM | POA: Diagnosis not present

## 2016-07-06 DIAGNOSIS — I10 Essential (primary) hypertension: Secondary | ICD-10-CM

## 2016-07-06 DIAGNOSIS — F411 Generalized anxiety disorder: Secondary | ICD-10-CM | POA: Diagnosis not present

## 2016-07-06 DIAGNOSIS — M1712 Unilateral primary osteoarthritis, left knee: Secondary | ICD-10-CM | POA: Diagnosis not present

## 2016-07-06 DIAGNOSIS — R001 Bradycardia, unspecified: Secondary | ICD-10-CM

## 2016-07-06 DIAGNOSIS — E785 Hyperlipidemia, unspecified: Secondary | ICD-10-CM | POA: Diagnosis not present

## 2016-07-06 DIAGNOSIS — J019 Acute sinusitis, unspecified: Secondary | ICD-10-CM

## 2016-07-06 MED ORDER — AZITHROMYCIN 250 MG PO TABS: ORAL_TABLET | ORAL | 0 refills | Status: AC

## 2016-07-06 NOTE — Assessment & Plan Note (Signed)
Resolved after injection at the last visit 

## 2016-07-06 NOTE — Assessment & Plan Note (Signed)
Resolved after the injection at the last visit

## 2016-07-06 NOTE — Addendum Note (Signed)
Addended by: Teddy Spike on: 07/06/2016 02:49 PM   Modules accepted: Orders

## 2016-07-06 NOTE — Telephone Encounter (Signed)
Call patient: Since her blood pressures have looked good recently and since her heart rate has been a little lower and we can definitely decrease her metoprolol. Metoprolol does tend to lower her heart rate services the most likely culprit. We could decrease it down to 50 mg and see if she is noticing her pulse is more consistently in the 50s. See what she would like to do.

## 2016-07-06 NOTE — Progress Notes (Signed)
  Subjective:    CC: Follow-up  HPI: Left knee pain: 100% better after injection.  Right trochanteric bursitis: Resolved after injection.  Past medical history, Surgical history, Family history not pertinant except as noted below, Social history, Allergies, and medications have been entered into the medical record, reviewed, and no changes needed.   Review of Systems: No fevers, chills, night sweats, weight loss, chest pain, or shortness of breath.   Objective:    General: Well Developed, well nourished, and in no acute distress.  Neuro: Alert and oriented x3, extra-ocular muscles intact, sensation grossly intact.  HEENT: Normocephalic, atraumatic, pupils equal round reactive to light, neck supple, no masses, no lymphadenopathy, thyroid nonpalpable.  Skin: Warm and dry, no rashes. Cardiac: Regular rate and rhythm, no murmurs rubs or gallops, no lower extremity edema.  Respiratory: Clear to auscultation bilaterally. Not using accessory muscles, speaking in full sentences. Left Knee: Normal to inspection with no erythema or effusion or obvious bony abnormalities. Palpation normal with no warmth or joint line tenderness or patellar tenderness or condyle tenderness. ROM normal in flexion and extension and lower leg rotation. Ligaments with solid consistent endpoints including ACL, PCL, LCL, MCL. Negative Mcmurray's and provocative meniscal tests. Non painful patellar compression. Patellar and quadriceps tendons unremarkable. Hamstring and quadriceps strength is normal. Right Hip: ROM IR: 60 Deg, ER: 60 Deg, Flexion: 120 Deg, Extension: 100 Deg, Abduction: 45 Deg, Adduction: 45 Deg Strength IR: 5/5, ER: 5/5, Flexion: 5/5, Extension: 5/5, Abduction: 5/5, Adduction: 5/5 Pelvic alignment unremarkable to inspection and palpation. Standing hip rotation and gait without trendelenburg / unsteadiness. Greater trochanter without tenderness to palpation. No tenderness over piriformis. No SI  joint tenderness and normal minimal SI movement.  Impression and Recommendations:    Trochanteric bursitis of right hip Resolved after injection at the last visit  Primary osteoarthritis of left knee Resolved after the injection at the last visit

## 2016-07-06 NOTE — Progress Notes (Signed)
Subjective:    Patient ID: Belinda Owens, female    DOB: 1932-02-28, 82 y.o.   MRN: [id number]  HPI F/U anxiety - We decided to start her on sertraline about 8 weeks ago after discussing that her anxiety levels were significantly increased. She had taken Prozac years ago and did not do well with it. We tapered her off of the Effexor.He says she did not tolerate the sertraline well. She tried to stick with it but said it made her feel extremely fatigued and caused palpitations. So she called the pharmacist and he taught her how to taper off of it. She's actually not felt much better off the medication at this point feels like her mood is in a better place and wants to just maintain off of medication.  Hypertension- Pt denies chest pain, SOB, dizziness, or heart palpitations.  Taking meds as directed w/o problems.  Denies medication side effects.    She also complains of significant sinus pressure and nasal congestion. She said she's been getting a lot of congestion and phlegm out of her nose and it just seems to be getting worse over the last couple of weeks. She is getting a lot of frontal headaches and pressure across the nasal bridge as well as some headaches on the left side of her head. No fevers chills or sweats. He has been using her nasal saline rinse.  Patient reports that her pulse is been dropping into the 40s at home. She denies any lightheadedness dizziness or weakness when this occurs. She says it doesn't feel like it's palpating or skipping.   Review of Systems  BP 136/69   Pulse 63   Wt 174 lb (78.9 kg)   SpO2 96%   BMI 31.83 kg/m     Allergies  Allergen Reactions  . Tylenol With Codeine #3  [Acetaminophen-Codeine] Nausea And Vomiting  . Atenolol Other (See Comments)    Headache  . Fluoxetine Other (See Comments)    Tearfulness  . Omeprazole Itching  . Sertraline Other (See Comments)    Fatigue, Palpitations    Past Medical History:  Diagnosis Date  . Fibrocystic  breast changes of both breasts    Gets yearly mammo and [place]  . History of rheumatic fever     Past Surgical History:  Procedure Laterality Date  . BREAST BIOPSY    . MENISCUS REPAIR Right   . ROTATOR CUFF REPAIR Right     Social History   Social History  . Marital status: Divorced    Spouse name: N/A  . Number of children: 3  . Years of education: some colle   Occupational History  . retired.     Social History Main Topics  . Smoking status: Former Smoker    Types: Cigarettes    Quit date: 10/23/1957  . Smokeless tobacco: Not on file  . Alcohol use Not on file  . Drug use: No  . Sexual activity: Not on file   Other Topics Concern  . Not on file   Social History Narrative   Drinks decaf coffee.  Quit smoking 1959.     Family History  Problem Relation Age of Onset  . Breast cancer Maternal Aunt   . Colon cancer Father   . Asthma Daughter     Outpatient Encounter Prescriptions as of 07/06/2016  Medication Sig  . albuterol (PROAIR HFA) 108 (90 BASE) MCG/ACT inhaler Inhale 2 puffs into the lungs every 6 (six) hours as needed for wheezing or shortness  of breath.  Marland Kitchen amLODipine (NORVASC) 5 MG tablet Take 1 tablet (5 mg total) by mouth daily.  Marland Kitchen aspirin 81 MG tablet Take 81 mg by mouth daily.  Marland Kitchen atorvastatin (LIPITOR) 20 MG tablet Take 1 tablet (20 mg total) by mouth at bedtime.  . gabapentin (NEURONTIN) 300 MG capsule Take 1 capsule (300 mg total) by mouth 3 (three) times daily.  Marland Kitchen losartan-hydrochlorothiazide (HYZAAR) 100-25 MG tablet Take 1 tablet by mouth daily.  . metoprolol succinate (TOPROL-XL) 100 MG 24 hr tablet Take 1 tablet (100 mg total) by mouth daily.  . montelukast (SINGULAIR) 10 MG tablet TAKE 1 TABLET DAILY  . ranitidine (ZANTAC) 300 MG capsule Take 1 capsule (300 mg total) by mouth every evening.  . sertraline (ZOLOFT) 50 MG tablet 1/2 tab po QD x 5 days then increase to whole tab daily.  Marland Kitchen zolpidem (AMBIEN) 5 MG tablet Take 1 tablet (5 mg total) by  mouth at bedtime as needed for sleep.  . [DISCONTINUED] venlafaxine (EFFEXOR) 37.5 MG tablet Take 1 tablet (37.5 mg total) by mouth daily.  Marland Kitchen azithromycin (ZITHROMAX) 250 MG tablet 2 Ttabs PO on Day 1, then one a day x 4 days.  . [DISCONTINUED] azithromycin (ZITHROMAX Z-PAK) 250 MG tablet Take 2 tablets (500 mg) on  Day 1,  followed by 1 tablet (250 mg) once daily on Days 2 through 5.   No facility-administered encounter medications on file as of 07/06/2016.          Objective:   Physical Exam  Constitutional: She is oriented to person, place, and time. She appears well-developed and well-nourished.  HENT:  Head: Normocephalic and atraumatic.  Right Ear: External ear normal.  Left Ear: External ear normal.  Nose: Nose normal.  Mouth/Throat: Oropharynx is clear and moist.  TMs and canals are clear.   Eyes: Conjunctivae and EOM are normal. Pupils are equal, round, and reactive to light.  Neck: Neck supple. No thyromegaly present.  Cardiovascular: Normal rate, regular rhythm and normal heart sounds.   Pulmonary/Chest: Effort normal and breath sounds normal. She has no wheezes.  Lymphadenopathy:    She has no cervical adenopathy.  Neurological: She is alert and oriented to person, place, and time.  Skin: Skin is warm and dry.  Psychiatric: She has a normal mood and affect. Her behavior is normal.       Assessment & Plan:  GAD - Doing very well overall. GAD 7 score of 3 today. Still feels nervous and anxious several days of the week. For now we'll hold off on restarting any medication. If we do need to try something in the future she may be a good candidate for Lexapro.  HTN - Well controlled. Continue current regimen. Follow up in  58mo.   Acute sinusitis-we'll treat with azithromycin. Call if not better in one week.    Bradycardia-we'll perform EKG today. EKG shows Rate of 51 bpm, normal sinus rhythm, bradycardia. No significant ST-T wave changes. We could consider decreasing her  metoprolol and this may help keep her from dropping into the 40s.

## 2016-07-07 LAB — BASIC METABOLIC PANEL WITH GFR
BUN: 15 mg/dL (ref 7–25)
CHLORIDE: 101 mmol/L (ref 98–110)
CO2: 29 mmol/L (ref 20–31)
Calcium: 9.8 mg/dL (ref 8.6–10.4)
Creat: 0.79 mg/dL (ref 0.60–0.88)
GFR, EST AFRICAN AMERICAN: 81 mL/min (ref >=60)
GFR, EST NON AFRICAN AMERICAN: 70 mL/min (ref >=60)
Glucose, Bld: 87 mg/dL (ref 65–99)
POTASSIUM: 3.9 mmol/L (ref 3.5–5.3)
Sodium: 141 mmol/L (ref 135–146)

## 2016-07-07 LAB — HEPATIC FUNCTION PANEL
ALK PHOS: 56 U/L (ref 33–130)
ALT: 16 U/L (ref 6–29)
AST: 19 U/L (ref 10–35)
Albumin: 4.3 g/dL (ref 3.6–5.1)
BILIRUBIN INDIRECT: 0.5 mg/dL (ref 0.2–1.2)
BILIRUBIN TOTAL: 0.6 mg/dL (ref 0.2–1.2)
Bilirubin, Direct: 0.1 mg/dL (ref <=0.2)
Total Protein: 6.8 g/dL (ref 6.1–8.1)

## 2016-07-07 LAB — LIPID PANEL
CHOLESTEROL: 199 mg/dL (ref 125–200)
HDL: 78 mg/dL (ref >=46)
LDL Cholesterol: 90 mg/dL (ref <130)
Total CHOL/HDL Ratio: 2.6 Ratio (ref <=5.0)
Triglycerides: 156 mg/dL — ABNORMAL HIGH (ref <150)
VLDL: 31 mg/dL — AB (ref <30)

## 2016-07-07 NOTE — Addendum Note (Signed)
Addended by: Narda Rutherford on: 07/07/2016 09:23 AM   Modules accepted: Orders

## 2016-07-10 NOTE — Telephone Encounter (Signed)
lvm informing pt of recommendations. Asked that she rtn call if ok with change.Belinda Owens

## 2016-07-11 MED ORDER — METOPROLOL SUCCINATE ER 25 MG PO TB24: 25.0000 mg | ORAL_TABLET | Freq: Every day | ORAL | 0 refills | Status: DC

## 2016-07-11 NOTE — Telephone Encounter (Signed)
We can try decreasing her metoprolol to 25mg  and see if helps before we change the sertraline.

## 2016-07-11 NOTE — Telephone Encounter (Signed)
Pt returned clinic call stating she has been on 50mg  of Metoprolol for the past 2-3 months. Questions if stopping the sertraline could help? States she noticed a decrease in her pulse when she began that medication. Pt took her BP at home today at home and it was 107/60, pulse 51. Will route.

## 2016-07-11 NOTE — Telephone Encounter (Signed)
OK 

## 2016-07-11 NOTE — Telephone Encounter (Signed)
Pt advised at most recent appt she is no longer on Sertraline. Will route back to PCP to see if still wants to decrease Metoprolol.

## 2016-07-11 NOTE — Telephone Encounter (Signed)
I sent over 25mg  metoprolol to try if she doesn't feel better off the sertraline.

## 2016-07-12 NOTE — Telephone Encounter (Signed)
Pt advised of new Rx recommendation, verbalized understanding. Will contact office if BP and/or pulse remain low. Pt does report she "feels fine" off the sertraline, no concerns.

## 2016-07-19 DIAGNOSIS — K118 Other diseases of salivary glands: Secondary | ICD-10-CM | POA: Diagnosis not present

## 2016-07-19 DIAGNOSIS — R0982 Postnasal drip: Secondary | ICD-10-CM | POA: Diagnosis not present

## 2016-08-04 ENCOUNTER — Ambulatory Visit (INDEPENDENT_AMBULATORY_CARE_PROVIDER_SITE_OTHER): Payer: Medicare Other | Admitting: Sports Medicine

## 2016-08-04 DIAGNOSIS — G8929 Other chronic pain: Secondary | ICD-10-CM | POA: Diagnosis not present

## 2016-08-04 DIAGNOSIS — M25511 Pain in right shoulder: Secondary | ICD-10-CM | POA: Diagnosis not present

## 2016-08-04 DIAGNOSIS — M25512 Pain in left shoulder: Secondary | ICD-10-CM

## 2016-08-04 NOTE — Progress Notes (Signed)
  Subjective:    CC: Bilateral shoulder pain  HPI: This is a pleasant 82 year old female, she has chronic bilateral shoulder pain related to both subacromial bursitis and glenohumeral degenerative changes, previous injection was a long time ago, she is having recurrence of bilateral shoulder pain, localized at the joint line on both sides and desires interventional treatment today, pain is moderate, persistent without radiation.  Right trochanteric bursitis: Injection was 2 months ago, having a recurrence of pain, understands that she cannot get another injection decision.  Past medical history:  Negative.  See flowsheet/record as well for more information.  Surgical history: Negative.  See flowsheet/record as well for more information.  Family history: Negative.  See flowsheet/record as well for more information.  Social history: Negative.  See flowsheet/record as well for more information.  Allergies, and medications have been entered into the medical record, reviewed, and no changes needed.   Review of Systems: No fevers, chills, night sweats, weight loss, chest pain, or shortness of breath.   Objective:    General: Well Developed, well nourished, and in no acute distress.  Neuro: Alert and oriented x3, extra-ocular muscles intact, sensation grossly intact.  HEENT: Normocephalic, atraumatic, pupils equal round reactive to light, neck supple, no masses, no lymphadenopathy, thyroid nonpalpable.  Skin: Warm and dry, no rashes. Cardiac: Regular rate and rhythm, no murmurs rubs or gallops, no lower extremity edema.  Respiratory: Clear to auscultation bilaterally. Not using accessory muscles, speaking in full sentences. Shoulders: Pain with external rotation on both sides, as well as tenderness at the anterior and posterior joint lines.  Procedure: Real-time Ultrasound Guided Injection of right glenohumeral joint Device: GE Logiq E  Verbal informed consent obtained.  Time-out conducted.    Noted no overlying erythema, induration, or other signs of local infection.  Skin prepped in a sterile fashion.  Local anesthesia: Topical Ethyl chloride.  With sterile technique and under real time ultrasound guidance:  22-gauge spinal needle advanced into joint 1 mL 3, 2 mL lidocaine, 2 mL Marcaine injected easily. Completed without difficulty  Pain immediately resolved suggesting accurate placement of the medication.  Advised to call if fevers/chills, erythema, induration, drainage, or persistent bleeding.  Images permanently stored and available for review in the ultrasound unit.  Impression: Technically successful ultrasound guided injection.  Procedure: Real-time Ultrasound Guided Injection of left glenohumeral joint Device: GE Logiq E  Verbal informed consent obtained.  Time-out conducted.  Noted no overlying erythema, induration, or other signs of local infection.  Skin prepped in a sterile fashion.  Local anesthesia: Topical Ethyl chloride.  With sterile technique and under real time ultrasound guidance:  22-gauge spinal needle advanced into joint 1 mL 3, 2 mL lidocaine, 2 mL Marcaine injected easily. Completed without difficulty  Pain immediately resolved suggesting accurate placement of the medication.  Advised to call if fevers/chills, erythema, induration, drainage, or persistent bleeding.  Images permanently stored and available for review in the ultrasound unit.  Impression: Technically successful ultrasound guided injection.  Impression and Recommendations:    Bilateral shoulder pain Previous injection was almost 1 year ago, repeat injections today, this time bilateral, pain is predominantly glenohumeral today so injections were placed into both shoulder joints. She just got a new job and will need to be lifting moderate weight items. Return in one month.

## 2016-08-04 NOTE — Assessment & Plan Note (Addendum)
Previous injection was almost 1 year ago, repeat injections today, this time bilateral, pain is predominantly glenohumeral today so injections were placed into both shoulder joints. She just got a new job and will need to be lifting moderate weight items. Return in one month.

## 2016-08-16 ENCOUNTER — Other Ambulatory Visit: Payer: Self-pay | Admitting: Family Medicine

## 2016-09-10 ENCOUNTER — Other Ambulatory Visit: Payer: Self-pay | Admitting: Family Medicine

## 2016-09-13 DIAGNOSIS — Z800 Family history of malignant neoplasm of digestive organs: Secondary | ICD-10-CM | POA: Diagnosis not present

## 2016-09-13 DIAGNOSIS — K589 Irritable bowel syndrome without diarrhea: Secondary | ICD-10-CM | POA: Diagnosis not present

## 2016-09-13 DIAGNOSIS — D369 Benign neoplasm, unspecified site: Secondary | ICD-10-CM | POA: Diagnosis not present

## 2016-09-18 ENCOUNTER — Telehealth: Payer: Self-pay

## 2016-09-18 NOTE — Telephone Encounter (Signed)
A prescription was written for 90 days with one refill in August. Thus, she should be good until February. She may need to just call her pharmacy because she should still have an active refill on file.

## 2016-09-19 NOTE — Telephone Encounter (Signed)
Left message for patient

## 2016-09-21 ENCOUNTER — Ambulatory Visit (INDEPENDENT_AMBULATORY_CARE_PROVIDER_SITE_OTHER): Payer: Medicare Other | Admitting: Sports Medicine

## 2016-09-21 ENCOUNTER — Encounter: Payer: Self-pay | Admitting: Sports Medicine

## 2016-09-21 DIAGNOSIS — M7061 Trochanteric bursitis, right hip: Secondary | ICD-10-CM | POA: Diagnosis not present

## 2016-09-21 NOTE — Progress Notes (Signed)
  Subjective:    CC: Right hip pain  HPI: This is a pleasant 82 year old female with known right greater trochanteric bursitis, she comes in with increasing pain over her right greater trochanter, most recent injection was 3.5 months ago. Pain is moderate, persistent, localized without radiation past her knee. She desires repeat interventional treatment today.  Past medical history:  Negative.  See flowsheet/record as well for more information.  Surgical history: Negative.  See flowsheet/record as well for more information.  Family history: Negative.  See flowsheet/record as well for more information.  Social history: Negative.  See flowsheet/record as well for more information.  Allergies, and medications have been entered into the medical record, reviewed, and no changes needed.   Review of Systems: No fevers, chills, night sweats, weight loss, chest pain, or shortness of breath.   Objective:    General: Well Developed, well nourished, and in no acute distress.  Neuro: Alert and oriented x3, extra-ocular muscles intact, sensation grossly intact.  HEENT: Normocephalic, atraumatic, pupils equal round reactive to light, neck supple, no masses, no lymphadenopathy, thyroid nonpalpable.  Skin: Warm and dry, no rashes. Cardiac: Regular rate and rhythm, no murmurs rubs or gallops, no lower extremity edema.  Respiratory: Clear to auscultation bilaterally. Not using accessory muscles, speaking in full sentences. Right Hip: ROM IR: 60 Deg, ER: 60 Deg, Flexion: 120 Deg, Extension: 100 Deg, Abduction: 45 Deg, Adduction: 45 Deg Strength IR: 5/5, ER: 5/5, Flexion: 5/5, Extension: 5/5, Abduction: 5/5, Adduction: 5/5 Pelvic alignment unremarkable to inspection and palpation. Standing hip rotation and gait without trendelenburg / unsteadiness. Greater trochanter with tenderness to palpation. No tenderness over piriformis. No SI joint tenderness and normal minimal SI movement.  Procedure: Real-time  Ultrasound Guided Injection of right greater trochanteric bursa Device: GE Logiq E  Verbal informed consent obtained.  Time-out conducted.  Noted no overlying erythema, induration, or other signs of local infection.  Skin prepped in a sterile fashion.  Local anesthesia: Topical Ethyl chloride.  With sterile technique and under real time ultrasound guidance:   Completed without difficulty  Pain immediately resolved suggesting accurate placement of the medication.  Advised to call if fevers/chills, erythema, induration, drainage, or persistent bleeding.  Images permanently stored and available for review in the ultrasound unit.  Impression: Technically successful ultrasound guided injection.  Impression and Recommendations:    Trochanteric bursitis of right hip 3.5 months post previous injection, repeat right trochanteric bursa injection as above, return as needed.

## 2016-09-21 NOTE — Assessment & Plan Note (Signed)
3.5 months post previous injection, repeat right trochanteric bursa injection as above, return as needed.

## 2016-10-02 DIAGNOSIS — H35373 Puckering of macula, bilateral: Secondary | ICD-10-CM | POA: Diagnosis not present

## 2016-10-02 DIAGNOSIS — H527 Unspecified disorder of refraction: Secondary | ICD-10-CM | POA: Diagnosis not present

## 2016-10-02 DIAGNOSIS — H02831 Dermatochalasis of right upper eyelid: Secondary | ICD-10-CM | POA: Diagnosis not present

## 2016-10-02 DIAGNOSIS — H40003 Preglaucoma, unspecified, bilateral: Secondary | ICD-10-CM | POA: Diagnosis not present

## 2016-10-02 DIAGNOSIS — H02834 Dermatochalasis of left upper eyelid: Secondary | ICD-10-CM | POA: Diagnosis not present

## 2016-11-01 DIAGNOSIS — H35373 Puckering of macula, bilateral: Secondary | ICD-10-CM | POA: Diagnosis not present

## 2016-11-01 DIAGNOSIS — H02834 Dermatochalasis of left upper eyelid: Secondary | ICD-10-CM | POA: Diagnosis not present

## 2016-11-01 DIAGNOSIS — H40003 Preglaucoma, unspecified, bilateral: Secondary | ICD-10-CM | POA: Diagnosis not present

## 2016-11-01 DIAGNOSIS — H02831 Dermatochalasis of right upper eyelid: Secondary | ICD-10-CM | POA: Diagnosis not present

## 2016-11-10 DIAGNOSIS — Z79899 Other long term (current) drug therapy: Secondary | ICD-10-CM | POA: Diagnosis not present

## 2016-11-10 DIAGNOSIS — D132 Benign neoplasm of duodenum: Secondary | ICD-10-CM | POA: Diagnosis not present

## 2016-11-10 DIAGNOSIS — Z7982 Long term (current) use of aspirin: Secondary | ICD-10-CM | POA: Diagnosis not present

## 2016-11-10 DIAGNOSIS — Z889 Allergy status to unspecified drugs, medicaments and biological substances status: Secondary | ICD-10-CM | POA: Diagnosis not present

## 2016-11-10 DIAGNOSIS — D131 Benign neoplasm of stomach: Secondary | ICD-10-CM | POA: Diagnosis not present

## 2016-11-10 DIAGNOSIS — E669 Obesity, unspecified: Secondary | ICD-10-CM | POA: Diagnosis not present

## 2016-11-10 DIAGNOSIS — Z886 Allergy status to analgesic agent status: Secondary | ICD-10-CM | POA: Diagnosis not present

## 2016-11-10 DIAGNOSIS — K219 Gastro-esophageal reflux disease without esophagitis: Secondary | ICD-10-CM | POA: Diagnosis not present

## 2016-11-10 DIAGNOSIS — I10 Essential (primary) hypertension: Secondary | ICD-10-CM | POA: Diagnosis not present

## 2016-11-10 DIAGNOSIS — E785 Hyperlipidemia, unspecified: Secondary | ICD-10-CM | POA: Diagnosis not present

## 2016-11-10 DIAGNOSIS — F329 Major depressive disorder, single episode, unspecified: Secondary | ICD-10-CM | POA: Diagnosis not present

## 2016-11-10 DIAGNOSIS — Z8719 Personal history of other diseases of the digestive system: Secondary | ICD-10-CM | POA: Diagnosis not present

## 2016-11-13 ENCOUNTER — Other Ambulatory Visit: Payer: Self-pay | Admitting: Family Medicine

## 2016-11-13 DIAGNOSIS — Z1231 Encounter for screening mammogram for malignant neoplasm of breast: Secondary | ICD-10-CM

## 2016-11-14 ENCOUNTER — Other Ambulatory Visit: Payer: Self-pay | Admitting: *Deleted

## 2016-11-14 DIAGNOSIS — E785 Hyperlipidemia, unspecified: Secondary | ICD-10-CM

## 2016-11-14 MED ORDER — ATORVASTATIN CALCIUM 20 MG PO TABS: 20.0000 mg | ORAL_TABLET | Freq: Every day | ORAL | 3 refills | Status: DC

## 2016-11-17 ENCOUNTER — Ambulatory Visit (INDEPENDENT_AMBULATORY_CARE_PROVIDER_SITE_OTHER): Payer: Medicare Other

## 2016-11-17 DIAGNOSIS — Z1231 Encounter for screening mammogram for malignant neoplasm of breast: Secondary | ICD-10-CM

## 2016-11-20 ENCOUNTER — Other Ambulatory Visit: Payer: Self-pay

## 2016-11-20 MED ORDER — ZOLPIDEM TARTRATE 5 MG PO TABS: 5.0000 mg | ORAL_TABLET | Freq: Every evening | ORAL | 1 refills | Status: DC | PRN

## 2016-11-23 ENCOUNTER — Encounter: Payer: Self-pay | Admitting: Sports Medicine

## 2016-11-23 ENCOUNTER — Ambulatory Visit (INDEPENDENT_AMBULATORY_CARE_PROVIDER_SITE_OTHER): Payer: Medicare Other | Admitting: Sports Medicine

## 2016-11-23 DIAGNOSIS — M7061 Trochanteric bursitis, right hip: Secondary | ICD-10-CM | POA: Diagnosis not present

## 2016-11-23 MED ORDER — MELOXICAM 15 MG PO TABS: ORAL_TABLET | ORAL | 3 refills | Status: DC

## 2016-11-23 NOTE — Assessment & Plan Note (Signed)
Previous injection was 2 months ago. She is not taking an NSAID, adding meloxicam. She is also having right-sided impingement type shoulder pain that will probably improve with meloxicam. Return in one month.

## 2016-11-23 NOTE — Progress Notes (Signed)
  Subjective:    CC: Follow-up  HPI: Right hip pain: History of trochanteric bursitis, we injected this 2 months ago, unfortunately she is having a recurrence of pain. She's not taking any NSAIDs. Also completed some right shoulder pain over the deltoid, worse with overhead activities and abduction.  Past medical history:  Negative.  See flowsheet/record as well for more information.  Surgical history: Negative.  See flowsheet/record as well for more information.  Family history: Negative.  See flowsheet/record as well for more information.  Social history: Negative.  See flowsheet/record as well for more information.  Allergies, and medications have been entered into the medical record, reviewed, and no changes needed.   Review of Systems: No fevers, chills, night sweats, weight loss, chest pain, or shortness of breath.   Objective:    General: Well Developed, well nourished, and in no acute distress.  Neuro: Alert and oriented x3, extra-ocular muscles intact, sensation grossly intact.  HEENT: Normocephalic, atraumatic, pupils equal round reactive to light, neck supple, no masses, no lymphadenopathy, thyroid nonpalpable.  Skin: Warm and dry, no rashes. Cardiac: Regular rate and rhythm, no murmurs rubs or gallops, no lower extremity edema.  Respiratory: Clear to auscultation bilaterally. Not using accessory muscles, speaking in full sentences. Right Shoulder: Inspection reveals no abnormalities, atrophy or asymmetry. Palpation is normal with no tenderness over AC joint or bicipital groove. ROM is full in all planes. Rotator cuff strength normal throughout. Positive Neer and Hawkin's tests, empty can. Speeds and Yergason's tests normal. No labral pathology noted with negative Obrien's, negative crank, negative clunk, and good stability. Normal scapular function observed. No painful arc and no drop arm sign. No apprehension sign Right Hip: ROM IR: 60 Deg, ER: 60 Deg, Flexion: 120  Deg, Extension: 100 Deg, Abduction: 45 Deg, Adduction: 45 Deg Strength IR: 5/5, ER: 5/5, Flexion: 5/5, Extension: 5/5, Abduction: 5/5, Adduction: 5/5 Pelvic alignment unremarkable to inspection and palpation. Standing hip rotation and gait without trendelenburg / unsteadiness. Greater trochanter with tenderness to palpation. No tenderness over piriformis. No SI joint tenderness and normal minimal SI movement.  Impression and Recommendations:    Trochanteric bursitis of right hip Previous injection was 2 months ago. She is not taking an NSAID, adding meloxicam. She is also having right-sided impingement type shoulder pain that will probably improve with meloxicam. Return in one month.

## 2016-12-01 ENCOUNTER — Telehealth: Payer: Self-pay | Admitting: *Deleted

## 2016-12-01 DIAGNOSIS — Z800 Family history of malignant neoplasm of digestive organs: Secondary | ICD-10-CM | POA: Diagnosis not present

## 2016-12-01 DIAGNOSIS — E669 Obesity, unspecified: Secondary | ICD-10-CM | POA: Diagnosis not present

## 2016-12-01 DIAGNOSIS — Z9189 Other specified personal risk factors, not elsewhere classified: Secondary | ICD-10-CM | POA: Insufficient documentation

## 2016-12-01 DIAGNOSIS — Z09 Encounter for follow-up examination after completed treatment for conditions other than malignant neoplasm: Secondary | ICD-10-CM | POA: Diagnosis not present

## 2016-12-01 DIAGNOSIS — E661 Drug-induced obesity: Secondary | ICD-10-CM | POA: Diagnosis not present

## 2016-12-01 DIAGNOSIS — N6092 Unspecified benign mammary dysplasia of left breast: Secondary | ICD-10-CM | POA: Insufficient documentation

## 2016-12-01 DIAGNOSIS — Z809 Family history of malignant neoplasm, unspecified: Secondary | ICD-10-CM | POA: Diagnosis not present

## 2016-12-01 DIAGNOSIS — Z9221 Personal history of antineoplastic chemotherapy: Secondary | ICD-10-CM | POA: Diagnosis not present

## 2016-12-01 DIAGNOSIS — Z87898 Personal history of other specified conditions: Secondary | ICD-10-CM | POA: Diagnosis not present

## 2016-12-01 DIAGNOSIS — Z6831 Body mass index (BMI) 31.0-31.9, adult: Secondary | ICD-10-CM | POA: Diagnosis not present

## 2016-12-01 DIAGNOSIS — D132 Benign neoplasm of duodenum: Secondary | ICD-10-CM | POA: Diagnosis not present

## 2016-12-01 NOTE — Telephone Encounter (Signed)
PA submitted through covermymed [id number]

## 2016-12-14 ENCOUNTER — Encounter: Payer: Self-pay | Admitting: Family Medicine

## 2016-12-14 DIAGNOSIS — K219 Gastro-esophageal reflux disease without esophagitis: Secondary | ICD-10-CM | POA: Diagnosis not present

## 2016-12-14 DIAGNOSIS — F329 Major depressive disorder, single episode, unspecified: Secondary | ICD-10-CM | POA: Diagnosis not present

## 2016-12-14 DIAGNOSIS — H40003 Preglaucoma, unspecified, bilateral: Secondary | ICD-10-CM | POA: Diagnosis not present

## 2016-12-14 DIAGNOSIS — M1990 Unspecified osteoarthritis, unspecified site: Secondary | ICD-10-CM | POA: Diagnosis not present

## 2016-12-14 DIAGNOSIS — H02831 Dermatochalasis of right upper eyelid: Secondary | ICD-10-CM | POA: Diagnosis not present

## 2016-12-14 DIAGNOSIS — E669 Obesity, unspecified: Secondary | ICD-10-CM | POA: Diagnosis not present

## 2016-12-14 DIAGNOSIS — Z6829 Body mass index (BMI) 29.0-29.9, adult: Secondary | ICD-10-CM | POA: Diagnosis not present

## 2016-12-14 DIAGNOSIS — Z8673 Personal history of transient ischemic attack (TIA), and cerebral infarction without residual deficits: Secondary | ICD-10-CM | POA: Diagnosis not present

## 2016-12-14 DIAGNOSIS — H53453 Other localized visual field defect, bilateral: Secondary | ICD-10-CM | POA: Diagnosis not present

## 2016-12-14 DIAGNOSIS — I10 Essential (primary) hypertension: Secondary | ICD-10-CM | POA: Diagnosis not present

## 2016-12-14 DIAGNOSIS — Z7982 Long term (current) use of aspirin: Secondary | ICD-10-CM | POA: Diagnosis not present

## 2016-12-14 DIAGNOSIS — H02834 Dermatochalasis of left upper eyelid: Secondary | ICD-10-CM | POA: Diagnosis not present

## 2016-12-14 DIAGNOSIS — Z79899 Other long term (current) drug therapy: Secondary | ICD-10-CM | POA: Diagnosis not present

## 2016-12-14 DIAGNOSIS — J45909 Unspecified asthma, uncomplicated: Secondary | ICD-10-CM | POA: Diagnosis not present

## 2016-12-14 DIAGNOSIS — E785 Hyperlipidemia, unspecified: Secondary | ICD-10-CM | POA: Diagnosis not present

## 2016-12-14 DIAGNOSIS — Z961 Presence of intraocular lens: Secondary | ICD-10-CM | POA: Diagnosis not present

## 2016-12-14 HISTORY — PX: RECONSTRUCTION OF EYELID: SHX6576

## 2016-12-15 ENCOUNTER — Other Ambulatory Visit: Payer: Self-pay | Admitting: *Deleted

## 2016-12-15 MED ORDER — ZOLPIDEM TARTRATE 5 MG PO TABS: 5.0000 mg | ORAL_TABLET | Freq: Every evening | ORAL | 1 refills | Status: DC | PRN

## 2016-12-21 ENCOUNTER — Other Ambulatory Visit: Payer: Self-pay | Admitting: Family Medicine

## 2016-12-26 ENCOUNTER — Other Ambulatory Visit: Payer: Self-pay | Admitting: Family Medicine

## 2016-12-29 DIAGNOSIS — H35373 Puckering of macula, bilateral: Secondary | ICD-10-CM | POA: Diagnosis not present

## 2016-12-29 DIAGNOSIS — H02831 Dermatochalasis of right upper eyelid: Secondary | ICD-10-CM | POA: Diagnosis not present

## 2016-12-29 DIAGNOSIS — H40003 Preglaucoma, unspecified, bilateral: Secondary | ICD-10-CM | POA: Diagnosis not present

## 2016-12-29 DIAGNOSIS — H02834 Dermatochalasis of left upper eyelid: Secondary | ICD-10-CM | POA: Diagnosis not present

## 2017-01-03 ENCOUNTER — Ambulatory Visit: Payer: Medicare Other | Admitting: Family Medicine

## 2017-01-04 ENCOUNTER — Encounter: Payer: Self-pay | Admitting: Family Medicine

## 2017-01-04 ENCOUNTER — Ambulatory Visit (INDEPENDENT_AMBULATORY_CARE_PROVIDER_SITE_OTHER): Payer: Medicare Other | Admitting: Sports Medicine

## 2017-01-04 ENCOUNTER — Ambulatory Visit (INDEPENDENT_AMBULATORY_CARE_PROVIDER_SITE_OTHER): Payer: Medicare Other

## 2017-01-04 ENCOUNTER — Ambulatory Visit (INDEPENDENT_AMBULATORY_CARE_PROVIDER_SITE_OTHER): Payer: Medicare Other | Admitting: Family Medicine

## 2017-01-04 VITALS — BP 118/62 | HR 67 | Ht 62.0 in | Wt 166.0 lb

## 2017-01-04 VITALS — BP 118/62 | HR 67 | Temp 97.6°F | Ht 62.0 in | Wt 166.0 lb

## 2017-01-04 DIAGNOSIS — I10 Essential (primary) hypertension: Secondary | ICD-10-CM

## 2017-01-04 DIAGNOSIS — K589 Irritable bowel syndrome without diarrhea: Secondary | ICD-10-CM | POA: Diagnosis not present

## 2017-01-04 DIAGNOSIS — Z23 Encounter for immunization: Secondary | ICD-10-CM

## 2017-01-04 DIAGNOSIS — M25511 Pain in right shoulder: Secondary | ICD-10-CM | POA: Diagnosis not present

## 2017-01-04 DIAGNOSIS — M25512 Pain in left shoulder: Secondary | ICD-10-CM | POA: Diagnosis not present

## 2017-01-04 DIAGNOSIS — G8929 Other chronic pain: Secondary | ICD-10-CM

## 2017-01-04 DIAGNOSIS — F411 Generalized anxiety disorder: Secondary | ICD-10-CM

## 2017-01-04 DIAGNOSIS — Z0000 Encounter for general adult medical examination without abnormal findings: Secondary | ICD-10-CM

## 2017-01-04 MED ORDER — METOPROLOL SUCCINATE ER 25 MG PO TB24: 25.0000 mg | ORAL_TABLET | Freq: Every day | ORAL | 6 refills | Status: DC

## 2017-01-04 NOTE — Progress Notes (Signed)
  Subjective:    CC: Bilateral shoulder pain  HPI: This is a pleasant 83 year old female with known shoulder osteoarthritis, her most recent injection was 5-6 months ago, now having a recurrence of pain, bilateral, joint lines, worse with most movements. Desires repeat interventional treatment today, pain radiates down to the elbow but not past.  Past medical history:  Negative.  See flowsheet/record as well for more information.  Surgical history: Negative.  See flowsheet/record as well for more information.  Family history: Negative.  See flowsheet/record as well for more information.  Social history: Negative.  See flowsheet/record as well for more information.  Allergies, and medications have been entered into the medical record, reviewed, and no changes needed.   Review of Systems: No fevers, chills, night sweats, weight loss, chest pain, or shortness of breath.   Objective:    General: Well Developed, well nourished, and in no acute distress.  Neuro: Alert and oriented x3, extra-ocular muscles intact, sensation grossly intact.  HEENT: Normocephalic, atraumatic, pupils equal round reactive to light, neck supple, no masses, no lymphadenopathy, thyroid nonpalpable.  Skin: Warm and dry, no rashes. Cardiac: Regular rate and rhythm, no murmurs rubs or gallops, no lower extremity edema.  Respiratory: Clear to auscultation bilaterally. Not using accessory muscles, speaking in full sentences. Bilateral Shoulder: Inspection reveals no abnormalities, atrophy or asymmetry. Palpation is normal with no tenderness over AC joint or bicipital groove. ROM is full in all planes. Rotator cuff strength normal throughout. No signs of impingement with negative Neer and Hawkin's tests, empty can. Speeds and Yergason's tests normal. Positive Obrien's, negative crank, negative clunk, and good stability. Normal scapular function observed. No painful arc and no drop arm sign. No apprehension  sign  Procedure: Real-time Ultrasound Guided Injection of left glenohumeral joint Device: GE Logiq E  Verbal informed consent obtained.  Time-out conducted.  Noted no overlying erythema, induration, or other signs of local infection.  Skin prepped in a sterile fashion.  Local anesthesia: Topical Ethyl chloride.  With sterile technique and under real time ultrasound guidance:  1 mL kenalog 40, 2 mL lidocaine, 2 mL bupivacaine injected easily Completed without difficulty  Pain immediately resolved suggesting accurate placement of the medication.  Advised to call if fevers/chills, erythema, induration, drainage, or persistent bleeding.  Images permanently stored and available for review in the ultrasound unit.  Impression: Technically successful ultrasound guided injection.  Procedure: Real-time Ultrasound Guided Injection of right glenohumeral joint Device: GE Logiq E  Verbal informed consent obtained.  Time-out conducted.  Noted no overlying erythema, induration, or other signs of local infection.  Skin prepped in a sterile fashion.  Local anesthesia: Topical Ethyl chloride.  With sterile technique and under real time ultrasound guidance:  1 mL kenalog 40, 2 mL lidocaine, 2 mL bupivacaine injected easily Completed without difficulty  Pain immediately resolved suggesting accurate placement of the medication.  Advised to call if fevers/chills, erythema, induration, drainage, or persistent bleeding.  Images permanently stored and available for review in the ultrasound unit.  Impression: Technically successful ultrasound guided injection.   Impression and Recommendations:    Bilateral shoulder pain Bilateral glenohumeral injection. Return as needed. Previous injections were 5-6 months ago.

## 2017-01-04 NOTE — Progress Notes (Signed)
Reviewed and agree with below Beatrice Lecher, MD

## 2017-01-04 NOTE — Assessment & Plan Note (Signed)
Bilateral glenohumeral injection. Return as needed. Previous injections were 5-6 months ago.

## 2017-01-04 NOTE — Progress Notes (Signed)
Quick Notes   Health Maintenance:  PN23 given today; up to date on all health maintenance    Abnormal Screen: None; MMSE 30/30- Passed Clock Test    Patient Concerns:  None; Pt recently had some extra skin removed from her eye lids, no problems since.     Nurse Concerns:  None

## 2017-01-04 NOTE — Progress Notes (Signed)
Subjective:    CC:   HPI: Hypertension- Pt denies chest pain, SOB, dizziness, or heart palpitations.  Taking meds as directed w/o problems.  Denies medication side effects.    Anxiety - She was started on Zoloft back in the summer for anxiety. He was pre-son Effexor and we had tapered her off of that. She did end up cutting her sertraline in half. She's feeling a little too sedated on a whole tab and has been doing fantastic on half. She's not had any additional side effects and feels like it's working well to help control her anxiety.  IBS - she is doing really well and is on a lot of relief with theIB Guard supplement. Is really helped settle down her stomach is just pricey. It comes out to about a dollar pill. Her daughter was supraspinatus a little less expensive off [place].  She also had a bilateral blepharoplasty done in February. She said she did well and is recovering well from that. Notice a tremendous improvement in her vision.  Past medical history, Surgical history, Family history not pertinant except as noted below, Social history, Allergies, and medications have been entered into the medical record, reviewed, and corrections made.   Review of Systems: No fevers, chills, night sweats, weight loss, chest pain, or shortness of breath.   Objective:    General: Well Developed, well nourished, and in no acute distress.  Neuro: Alert and oriented x3, extra-ocular muscles intact, sensation grossly intact.  HEENT: Normocephalic, atraumatic  Skin: Warm and dry, no rashes. Cardiac: Regular rate and rhythm, no murmurs rubs or gallops, no lower extremity edema.  Respiratory: Clear to auscultation bilaterally. Not using accessory muscles, speaking in full sentences.   Impression and Recommendations:    HTN - Well controlled. Continue current regimen. Follow up in  6 months.   GAD - doing very well. GAD 7 score of 2 today which is fantastic. She rates her symptoms as not difficult at  all. Continue with a half a tab of the sertraline. She does not need refills today.  IBS- much improved on theIB Guard. Follows with [facility].

## 2017-01-04 NOTE — Progress Notes (Signed)
Subjective:   Belinda Owens is a 83 y.o. female who presents for Medicare Annual (Subsequent) preventive examination.  Review of Systems:  Cardiac Risk Factors include: advanced age (>84men, >57 women);family history of premature cardiovascular disease;smoking/ tobacco exposure;obesity (BMI >30kg/m2);hypertension     Objective:     Vitals: BP 118/62 (BP Location: Right Arm, Patient Position: Sitting, Cuff Size: Normal)   Pulse 67   Temp 97.6 F (36.4 C) (Oral)   Ht 5\' 2"  (1.575 m)   Wt 166 lb (75.3 kg)   SpO2 98%   BMI 30.36 kg/m   Body mass index is 30.36 kg/m.   Tobacco History  Smoking Status  . Former Smoker  . Types: Cigarettes  . Quit date: 10/23/1957  Smokeless Tobacco  . Never Used     Counseling given: No   Past Medical History:  Diagnosis Date  . Fibrocystic breast changes of both breasts    Gets yearly mammo and [place]  . History of rheumatic fever    Past Surgical History:  Procedure Laterality Date  . BREAST BIOPSY    . MENISCUS REPAIR Right   . RECONSTRUCTION OF EYELID Bilateral 12/14/2016  . ROTATOR CUFF REPAIR Right    Family History  Problem Relation Age of Onset  . Breast cancer Maternal Aunt   . Colon cancer Father   . Asthma Daughter   . Heart disease Mother   . Cancer - Other Brother    History  Sexual Activity  . Sexual activity: No    Outpatient Encounter Prescriptions as of 01/04/2017  Medication Sig  . albuterol (PROAIR HFA) 108 (90 BASE) MCG/ACT inhaler Inhale 2 puffs into the lungs every 6 (six) hours as needed for wheezing or shortness of breath.  Marland Kitchen amLODipine (NORVASC) 5 MG tablet Take 1 tablet (5 mg total) by mouth daily. Keep appointment in march  . aspirin 81 MG tablet Take 81 mg by mouth daily.  Marland Kitchen atorvastatin (LIPITOR) 20 MG tablet Take 1 tablet (20 mg total) by mouth at bedtime.  Marland Kitchen losartan-hydrochlorothiazide (HYZAAR) 100-25 MG tablet TAKE 1 TABLET BY MOUTH EVERY DAY  . metoprolol succinate (TOPROL-XL) 25 MG 24 hr  tablet Take 1 tablet (25 mg total) by mouth daily.  . montelukast (SINGULAIR) 10 MG tablet TAKE 1 TABLET DAILY  . ranitidine (ZANTAC) 300 MG capsule Take 1 capsule (300 mg total) by mouth every evening.  . sertraline (ZOLOFT) 50 MG tablet TAKE 1/2 TABLET BY MOUTH ONCE DAILY FOR 5 DAYS THEN TAKE 1 TABLET BY MOUTH ONCE DAILY  . zolpidem (AMBIEN) 5 MG tablet Take 1 tablet (5 mg total) by mouth at bedtime as needed for sleep.  . [DISCONTINUED] amLODipine (NORVASC) 5 MG tablet Take 1 tablet (5 mg total) by mouth daily.  . [DISCONTINUED] gabapentin (NEURONTIN) 300 MG capsule Take 1 capsule (300 mg total) by mouth 3 (three) times daily.  . [DISCONTINUED] meloxicam (MOBIC) 15 MG tablet One tab PO qAM with breakfast for 2 weeks, then daily prn pain.   No facility-administered encounter medications on file as of 01/04/2017.     Activities of Daily Living In your present state of health, do you have any difficulty performing the following activities: 01/04/2017  Hearing? N  Vision? N  Difficulty concentrating or making decisions? Y  Walking or climbing stairs? N  Dressing or bathing? N  Doing errands, shopping? N  Preparing Food and eating ? N  Using the Toilet? N  In the past six months, have you accidently leaked  urine? Y  Do you have problems with loss of bowel control? N  Managing your Medications? N  Managing your Finances? N  Housekeeping or managing your Housekeeping? N  Some recent data might be hidden    Patient Care Team: Hali Marry, MD as PCP - General (Family Medicine)    Assessment:    Exercise Activities and Dietary recommendations Current Exercise Habits: Home exercise routine, Type of exercise: walking;strength training/weights, Time (Minutes): 20, Frequency (Times/Week): 7, Weekly Exercise (Minutes/Week): 140, Intensity: Mild, Exercise limited by: None identified  Goals    . <enter goal here>          Starting 01/04/17, I will maintain my current lifestyle.        Fall Risk Fall Risk  01/04/2017 08/19/2015 02/03/2015  Falls in the past year? Yes Yes Yes  Number falls in past yr: 1 - 1  Injury with Fall? Yes - Yes  Risk for fall due to : - - Other (Comment)  Follow up Falls prevention discussed - Falls prevention discussed   Depression Screen PHQ 2/9 Scores 01/04/2017 08/19/2015 02/03/2015  PHQ - 2 Score 0 0 0     Cognitive Function MMSE - Mini Mental State Exam 01/04/2017  Orientation to time 5  Orientation to Place 5  Registration 3  Attention/ Calculation 5  Recall 3  Language- name 2 objects 2  Language- repeat 1  Language- follow 3 step command 3  Language- read & follow direction 1  Write a sentence 1  Copy design 1  Total score 30        Immunization History  Administered Date(s) Administered  . Influenza,inj,Quad PF,36+ Mos 08/05/2015, 07/06/2016  . Pneumococcal Conjugate-13 08/19/2015  . Pneumococcal Polysaccharide-23 01/04/2017  . Tdap 03/23/2014   Screening Tests Health Maintenance  Topic Date Due  . TETANUS/TDAP  03/23/2024  . INFLUENZA VACCINE  Completed  . DEXA SCAN  Completed  . PNA vac Low Risk Adult  Completed      Plan:    I have personally reviewed and addressed the Medicare Annual Wellness questionnaire and have noted the following in the patient's chart:  A. Medical and social history B. Use of alcohol, tobacco or illicit drugs  C. Current medications and supplements D. Functional ability and status E.  Nutritional status F.  Physical activity G. Advance directives H. List of other physicians I.  Hospitalizations, surgeries, and ER visits in previous 12 months J.  [place] to include hearing, vision, cognitive, depression L. Referrals and appointments - none  In addition, I have reviewed and discussed with patient certain preventive protocols, quality metrics, and best practice recommendations. A written personalized care plan for preventive services as well as general preventive  health recommendations were provided to patient.  See attached scanned questionnaire for additional information.   Signed,   Allyn Kenner, LPN Health Advisor

## 2017-01-04 NOTE — Patient Instructions (Addendum)
Belinda Owens , Thank you for taking time to come for your Medicare Wellness Visit. I appreciate your ongoing commitment to your health goals. Please review the following plan we discussed and let me know if I can assist you in the future.   Screening recommendations/referrals: Colonoscopy: Complete, unless the need arises.  Mammogram: Complete, unless the need arises. Bone Density: Complete, unless the need arises.  Recommended yearly ophthalmology/optometry visit for glaucoma screening and checkup Recommended yearly dental visit for hygiene and checkup  Vaccinations: Influenza vaccine: Due 07/06/17 Pneumococcal vaccine: Complete Tdap vaccine: Due 03/23/2024 Shingles vaccine:Due Now  Advanced directives: You were given a copy of the Healthcare POA/Living Will. Please fill it out, have it notarized and bring back at your next visit.   Conditions/risks identified:  Next appointment: Seeing Dr. Madilyn Fireman after this appointment.   Preventive Care 48 Years and Older, Female Preventive care refers to lifestyle choices and visits with your health care provider that can promote health and wellness. What does preventive care include?  A yearly physical exam. This is also called an annual well check.  Dental exams once or twice a year.  Routine eye exams. Ask your health care provider how often you should have your eyes checked.  Personal lifestyle choices, including:  Daily care of your teeth and gums.  Regular physical activity.  Eating a healthy diet.  Avoiding tobacco and drug use.  Limiting alcohol use.  Practicing safe sex.  Taking low-dose aspirin every day.  Taking vitamin and mineral supplements as recommended by your health care provider. What happens during an annual well check? The services and screenings done by your health care provider during your annual well check will depend on your age, overall health, lifestyle risk factors, and family history of  disease. Counseling  Your health care provider may ask you questions about your:  Alcohol use.  Tobacco use.  Drug use.  Emotional well-being.  Home and relationship well-being.  Sexual activity.  Eating habits.  History of falls.  Memory and ability to understand (cognition).  Work and work Statistician.  Reproductive health. Screening  You may have the following tests or measurements:  Height, weight, and BMI.  Blood pressure.  Lipid and cholesterol levels. These may be checked every 5 years, or more frequently if you are over 25 years old.  Skin check.  Lung cancer screening. You may have this screening every year starting at age 49 if you have a 30-pack-year history of smoking and currently smoke or have quit within the past 15 years.  Fecal occult blood test (FOBT) of the stool. You may have this test every year starting at age 41.  Flexible sigmoidoscopy or colonoscopy. You may have a sigmoidoscopy every 5 years or a colonoscopy every 10 years starting at age 40.  Hepatitis C blood test.  Hepatitis B blood test.  Sexually transmitted disease (STD) testing.  Diabetes screening. This is done by checking your blood sugar (glucose) after you have not eaten for a while (fasting). You may have this done every 1-3 years.  Bone density scan. This is done to screen for osteoporosis. You may have this done starting at age 71.  Mammogram. This may be done every 1-2 years. Talk to your health care provider about how often you should have regular mammograms. Talk with your health care provider about your test results, treatment options, and if necessary, the need for more tests. Vaccines  Your health care provider may recommend certain vaccines, such as:  Influenza vaccine. This is recommended every year.  Tetanus, diphtheria, and acellular pertussis (Tdap, Td) vaccine. You may need a Td booster every 10 years.  Zoster vaccine. You may need this after age  7.  Pneumococcal 13-valent conjugate (PCV13) vaccine. One dose is recommended after age 54.  Pneumococcal polysaccharide (PPSV23) vaccine. One dose is recommended after age 41. Talk to your health care provider about which screenings and vaccines you need and how often you need them. This information is not intended to replace advice given to you by your health care provider. Make sure you discuss any questions you have with your health care provider. Document Released: 11/05/2015 Document Revised: 06/28/2016 Document Reviewed: 08/10/2015 Elsevier Interactive Patient Education  2017 [place] Prevention in the Home Falls can cause injuries. They can happen to people of all ages. There are many things you can do to make your home safe and to help prevent falls. What can I do on the outside of my home?  Regularly fix the edges of walkways and driveways and fix any cracks.  Remove anything that might make you trip as you walk through a door, such as a raised step or threshold.  Trim any bushes or trees on the path to your home.  Use bright outdoor lighting.  Clear any walking paths of anything that might make someone trip, such as rocks or tools.  Regularly check to see if handrails are loose or broken. Make sure that both sides of any steps have handrails.  Any raised decks and porches should have guardrails on the edges.  Have any leaves, snow, or ice cleared regularly.  Use sand or salt on walking paths during winter.  Clean up any spills in your garage right away. This includes oil or grease spills. What can I do in the bathroom?  Use night lights.  Install grab bars by the toilet and in the tub and shower. Do not use towel bars as grab bars.  Use non-skid mats or decals in the tub or shower.  If you need to sit down in the shower, use a plastic, non-slip stool.  Keep the floor dry. Clean up any water that spills on the floor as soon as it happens.  Remove  soap buildup in the tub or shower regularly.  Attach bath mats securely with double-sided non-slip rug tape.  Do not have throw rugs and other things on the floor that can make you trip. What can I do in the bedroom?  Use night lights.  Make sure that you have a light by your bed that is easy to reach.  Do not use any sheets or blankets that are too big for your bed. They should not hang down onto the floor.  Have a firm chair that has side arms. You can use this for support while you get dressed.  Do not have throw rugs and other things on the floor that can make you trip. What can I do in the kitchen?  Clean up any spills right away.  Avoid walking on wet floors.  Keep items that you use a lot in easy-to-reach places.  If you need to reach something above you, use a strong step stool that has a grab bar.  Keep electrical cords out of the way.  Do not use floor polish or wax that makes floors slippery. If you must use wax, use non-skid floor wax.  Do not have throw rugs and other things on the floor that  can make you trip. What can I do with my stairs?  Do not leave any items on the stairs.  Make sure that there are handrails on both sides of the stairs and use them. Fix handrails that are broken or loose. Make sure that handrails are as long as the stairways.  Check any carpeting to make sure that it is firmly attached to the stairs. Fix any carpet that is loose or worn.  Avoid having throw rugs at the top or bottom of the stairs. If you do have throw rugs, attach them to the floor with carpet tape.  Make sure that you have a light switch at the top of the stairs and the bottom of the stairs. If you do not have them, ask someone to add them for you. What else can I do to help prevent falls?  Wear shoes that:  Do not have high heels.  Have rubber bottoms.  Are comfortable and fit you well.  Are closed at the toe. Do not wear sandals.  If you use a  stepladder:  Make sure that it is fully opened. Do not climb a closed stepladder.  Make sure that both sides of the stepladder are locked into place.  Ask someone to hold it for you, if possible.  Clearly mark and make sure that you can see:  Any grab bars or handrails.  First and last steps.  Where the edge of each step is.  Use tools that help you move around (mobility aids) if they are needed. These include:  Canes.  Walkers.  Scooters.  Crutches.  Turn on the lights when you go into a dark area. Replace any light bulbs as soon as they burn out.  Set up your furniture so you have a clear path. Avoid moving your furniture around.  If any of your floors are uneven, fix them.  If there are any pets around you, be aware of where they are.  Review your medicines with your doctor. Some medicines can make you feel dizzy. This can increase your chance of falling. Ask your doctor what other things that you can do to help prevent falls. This information is not intended to replace advice given to you by your health care provider. Make sure you discuss any questions you have with your health care provider. Document Released: 08/05/2009 Document Revised: 03/16/2016 Document Reviewed: 11/13/2014 Elsevier Interactive Patient Education  2017 [employer].

## 2017-01-08 DIAGNOSIS — I10 Essential (primary) hypertension: Secondary | ICD-10-CM | POA: Diagnosis not present

## 2017-01-08 LAB — BASIC METABOLIC PANEL
BUN: 33 mg/dL — ABNORMAL HIGH (ref 7–25)
CALCIUM: 9.6 mg/dL (ref 8.6–10.4)
CO2: 25 mmol/L (ref 20–31)
Chloride: 100 mmol/L (ref 98–110)
Creat: 0.87 mg/dL (ref 0.60–0.88)
Glucose, Bld: 91 mg/dL (ref 65–99)
POTASSIUM: 4 mmol/L (ref 3.5–5.3)
SODIUM: 137 mmol/L (ref 135–146)

## 2017-01-09 NOTE — Progress Notes (Signed)
All labs are normal. 

## 2017-01-31 ENCOUNTER — Encounter: Payer: Self-pay | Admitting: Sports Medicine

## 2017-01-31 ENCOUNTER — Ambulatory Visit (INDEPENDENT_AMBULATORY_CARE_PROVIDER_SITE_OTHER): Payer: Medicare Other | Admitting: Sports Medicine

## 2017-01-31 DIAGNOSIS — R3129 Other microscopic hematuria: Secondary | ICD-10-CM | POA: Diagnosis not present

## 2017-01-31 LAB — POCT URINALYSIS DIPSTICK
Bilirubin, UA: NEGATIVE
Glucose, UA: NEGATIVE
Ketones, UA: NEGATIVE
Nitrite, UA: NEGATIVE
Protein, UA: NEGATIVE
Spec Grav, UA: <=1.005 — AB (ref 1.010–1.025)
Urobilinogen, UA: 0.2 U/dL
pH, UA: 6 (ref 5.0–8.0)

## 2017-01-31 MED ORDER — CIPROFLOXACIN HCL 750 MG PO TABS: 750.0000 mg | ORAL_TABLET | Freq: Two times a day (BID) | ORAL | 0 refills | Status: AC

## 2017-01-31 NOTE — Progress Notes (Signed)
  Subjective:    CC: Dysuria  HPI: This is a pleasant 83 year old female with frequent UTIs, she tells her she has been evaluated by urologist with cystoscopy and ultrasound for her bladder for microscopic hematuria but tells me she has not yet had her kidneys imaged. Unfortunately she does have right flank pain, burning, urgency, frequency without fevers or chills. Moderate, persistent.  Past medical history:  Negative.  See flowsheet/record as well for more information.  Surgical history: Negative.  See flowsheet/record as well for more information.  Family history: Negative.  See flowsheet/record as well for more information.  Social history: Negative.  See flowsheet/record as well for more information.  Allergies, and medications have been entered into the medical record, reviewed, and no changes needed.   Review of Systems: No fevers, chills, night sweats, weight loss, chest pain, or shortness of breath.   Objective:    General: Well Developed, well nourished, and in no acute distress.  Neuro: Alert and oriented x3, extra-ocular muscles intact, sensation grossly intact.  HEENT: Normocephalic, atraumatic, pupils equal round reactive to light, neck supple, no masses, no lymphadenopathy, thyroid nonpalpable.  Skin: Warm and dry, no rashes. Cardiac: Regular rate and rhythm, no murmurs rubs or gallops, no lower extremity edema.  Respiratory: Clear to auscultation bilaterally. Not using accessory muscles, speaking in full sentences. Abdomen: Soft and the superpubic region with right costovertebral angle pain, no guarding, rigidity, rebound tenderness, normal bowel sounds.  Urinalysis is positive for nitrites, leukocytes, blood.  Impression and Recommendations:    Microscopic hematuria Urinary tract infection present today.  Cipro 750 twice a day, she does have right flank pain, so this is consistent with pyelonephritis. I went to see her back in a month, if she has persistent hematuria  despite treating her UTI we will image her urinary tract.  She tells me her urologist has never imaged her kidneys, just the bladder with ultrasound and cystoscopy.

## 2017-01-31 NOTE — Addendum Note (Signed)
Addended by: Elizabeth Sauer on: 01/31/2017 11:18 AM   Modules accepted: Orders

## 2017-01-31 NOTE — Assessment & Plan Note (Addendum)
Urinary tract infection present today.  Cipro 750 twice a day, she does have right flank pain, so this is consistent with pyelonephritis. I went to see her back in a month, if she has persistent hematuria despite treating her UTI we will image her urinary tract.  She tells me her urologist has never imaged her kidneys, just the bladder with ultrasound and cystoscopy. Causes could certainly include a retained intracalyceal stones, renal cysts, renal cell carcinoma.

## 2017-02-01 LAB — URINE CULTURE: Organism ID, Bacteria: NO GROWTH

## 2017-02-14 ENCOUNTER — Other Ambulatory Visit: Payer: Self-pay | Admitting: Family Medicine

## 2017-02-21 ENCOUNTER — Ambulatory Visit (INDEPENDENT_AMBULATORY_CARE_PROVIDER_SITE_OTHER): Payer: Medicare Other | Admitting: Sports Medicine

## 2017-02-21 ENCOUNTER — Encounter: Payer: Self-pay | Admitting: Sports Medicine

## 2017-02-21 VITALS — BP 126/70 | HR 80 | Resp 16 | Wt 166.0 lb

## 2017-02-21 DIAGNOSIS — R319 Hematuria, unspecified: Secondary | ICD-10-CM

## 2017-02-21 DIAGNOSIS — R3129 Other microscopic hematuria: Secondary | ICD-10-CM | POA: Diagnosis not present

## 2017-02-21 LAB — POCT URINALYSIS DIPSTICK
Bilirubin, UA: NEGATIVE
Glucose, UA: NEGATIVE
Ketones, UA: NEGATIVE
Nitrite, UA: NEGATIVE
Protein, UA: NEGATIVE
Spec Grav, UA: 1.01 (ref 1.010–1.025)
Urobilinogen, UA: 0.2 E.U./dL
pH, UA: 6 (ref 5.0–8.0)

## 2017-02-21 MED ORDER — FLUCONAZOLE 150 MG PO TABS: 150.0000 mg | ORAL_TABLET | Freq: Once | ORAL | 0 refills | Status: AC

## 2017-02-21 MED ORDER — NITROFURANTOIN MONOHYD MACRO 100 MG PO CAPS: 100.0000 mg | ORAL_CAPSULE | Freq: Two times a day (BID) | ORAL | 0 refills | Status: DC

## 2017-02-21 NOTE — Progress Notes (Signed)
  Subjective:    CC: Follow-up  HPI: This is a pleasant 83 year old female, I'm seeing her for follow-up of her urinary tract infection. We treated her with ciprofloxacin at the last visit, unfortunately she still has some urgency, frequency, burning. Flank pain is gone.  Past medical history:  Negative.  See flowsheet/record as well for more information.  Surgical history: Negative.  See flowsheet/record as well for more information.  Family history: Negative.  See flowsheet/record as well for more information.  Social history: Negative.  See flowsheet/record as well for more information.  Allergies, and medications have been entered into the medical record, reviewed, and no changes needed.   Review of Systems: No fevers, chills, night sweats, weight loss, chest pain, or shortness of breath.   Objective:    General: Well Developed, well nourished, and in no acute distress.  Neuro: Alert and oriented x3, extra-ocular muscles intact, sensation grossly intact.  HEENT: Normocephalic, atraumatic, pupils equal round reactive to light, neck supple, no masses, no lymphadenopathy, thyroid nonpalpable.  Skin: Warm and dry, no rashes. Cardiac: Regular rate and rhythm, no murmurs rubs or gallops, no lower extremity edema.  Respiratory: Clear to auscultation bilaterally. Not using accessory muscles, speaking in full sentences. Abdomen: Soft, nontender, nondistended, no bowel sounds, palpable masses, guarding, rigidity, rebound tenderness. No costovertebral angle pain.  Urinalysis is positive for trace leukocytes and trace blood.  Impression and Recommendations:    Microscopic hematuria Still with leukocytes and blood in the urine, persistent symptoms of dysuria, frequency, urgency. Switching to 2 weeks of Macrobid. Return in 2 weeks, if there is still persistent hematuria we will image her urinary tract.  I spent 25 minutes with this patient, greater than 50% was face-to-face time counseling  regarding the above diagnoses

## 2017-02-21 NOTE — Assessment & Plan Note (Signed)
Still with leukocytes and blood in the urine, persistent symptoms of dysuria, frequency, urgency. Switching to 2 weeks of Macrobid. Return in 2 weeks, if there is still persistent hematuria we will image her urinary tract.

## 2017-02-23 LAB — URINE CULTURE

## 2017-02-28 ENCOUNTER — Ambulatory Visit: Payer: Medicare Other | Admitting: Sports Medicine

## 2017-03-07 ENCOUNTER — Ambulatory Visit (INDEPENDENT_AMBULATORY_CARE_PROVIDER_SITE_OTHER): Payer: Medicare Other

## 2017-03-07 ENCOUNTER — Ambulatory Visit (INDEPENDENT_AMBULATORY_CARE_PROVIDER_SITE_OTHER): Payer: Medicare Other | Admitting: Sports Medicine

## 2017-03-07 ENCOUNTER — Encounter: Payer: Self-pay | Admitting: Sports Medicine

## 2017-03-07 VITALS — BP 125/73 | HR 59 | Resp 18 | Wt 163.4 lb

## 2017-03-07 DIAGNOSIS — R3129 Other microscopic hematuria: Secondary | ICD-10-CM

## 2017-03-07 DIAGNOSIS — M25512 Pain in left shoulder: Secondary | ICD-10-CM

## 2017-03-07 DIAGNOSIS — G8929 Other chronic pain: Secondary | ICD-10-CM | POA: Diagnosis not present

## 2017-03-07 DIAGNOSIS — N281 Cyst of kidney, acquired: Secondary | ICD-10-CM | POA: Diagnosis not present

## 2017-03-07 DIAGNOSIS — M25511 Pain in right shoulder: Secondary | ICD-10-CM

## 2017-03-07 DIAGNOSIS — N200 Calculus of kidney: Secondary | ICD-10-CM | POA: Diagnosis not present

## 2017-03-07 LAB — POCT URINALYSIS DIPSTICK
Bilirubin, UA: NEGATIVE
Glucose, UA: NEGATIVE
Ketones, UA: NEGATIVE
Leukocytes, UA: NEGATIVE
Nitrite, UA: NEGATIVE
Protein, UA: NEGATIVE
Spec Grav, UA: <=1.005 — AB (ref 1.010–1.025)
Urobilinogen, UA: 0.2 E.U./dL
pH, UA: 6.5 (ref 5.0–8.0)

## 2017-03-07 NOTE — Assessment & Plan Note (Signed)
Urinary tract infection symptoms have resolved. This took a prolonged course of antibiotics, because she still has some hematuria we are going to do a urinary tract ultrasound. Further follow-up with PCP.

## 2017-03-07 NOTE — Assessment & Plan Note (Signed)
I did bilateral glenohumeral injections 2 months ago, she is having subacromial impingement type symptoms on the right, right subacromial bursa was injected and I need her to do some aggressive formal physical therapy.

## 2017-03-07 NOTE — Progress Notes (Signed)
  Subjective:    CC: Follow-up  HPI: This is a pleasant 83 year old female, I been treating her for somewhat of a persistent/chronic urinary tract infection, her urinary symptoms are now gone, because she did have some hematuria on the last urinalysis we obtained another today, that showed persistence of microscopic hematuria. No abdominal pain, frequency, urgency, constitutional symptoms.  Right shoulder pain: Known bilateral glenohumeral arthritis, we injected her glenohumeral joints bilaterally 2 months ago, pain today is on the right, over the deltoid and worse with overhead activities more consistent with a impingement type process.  Past medical history:  Negative.  See flowsheet/record as well for more information.  Surgical history: Negative.  See flowsheet/record as well for more information.  Family history: Negative.  See flowsheet/record as well for more information.  Social history: Negative.  See flowsheet/record as well for more information.  Allergies, and medications have been entered into the medical record, reviewed, and no changes needed.   Review of Systems: No fevers, chills, night sweats, weight loss, chest pain, or shortness of breath.   Objective:    General: Well Developed, well nourished, and in no acute distress.  Neuro: Alert and oriented x3, extra-ocular muscles intact, sensation grossly intact.  HEENT: Normocephalic, atraumatic, pupils equal round reactive to light, neck supple, no masses, no lymphadenopathy, thyroid nonpalpable.  Skin: Warm and dry, no rashes. Cardiac: Regular rate and rhythm, no murmurs rubs or gallops, no lower extremity edema.  Respiratory: Clear to auscultation bilaterally. Not using accessory muscles, speaking in full sentences. Right shoulder: Positive Neer's, Hawkins, empty can sign.  Procedure: Real-time Ultrasound Guided Injection of right subcarinal bursa Device: GE Logiq E  Verbal informed consent obtained.  Time-out conducted.   Noted no overlying erythema, induration, or other signs of local infection.  Skin prepped in a sterile fashion.  Local anesthesia: Topical Ethyl chloride.  With sterile technique and under real time ultrasound guidance:  1 mL Kenalog 40, 1 mL lidocaine, 1 mL bupivacaine injected easily. Completed without difficulty  Pain immediately resolved suggesting accurate placement of the medication.  Advised to call if fevers/chills, erythema, induration, drainage, or persistent bleeding.  Images permanently stored and available for review in the ultrasound unit.  Impression: Technically successful ultrasound guided injection.  Urinalysis is still positive for hematuria  Impression and Recommendations:    Microscopic hematuria Urinary tract infection symptoms have resolved. This took a prolonged course of antibiotics, because she still has some hematuria we are going to do a urinary tract ultrasound. Further follow-up with PCP.  Bilateral shoulder pain I did bilateral glenohumeral injections 2 months ago, she is having subacromial impingement type symptoms on the right, right subacromial bursa was injected and I need her to do some aggressive formal physical therapy.

## 2017-03-08 LAB — URINE CULTURE: Organism ID, Bacteria: NO GROWTH

## 2017-03-13 ENCOUNTER — Ambulatory Visit (INDEPENDENT_AMBULATORY_CARE_PROVIDER_SITE_OTHER): Payer: Medicare Other | Admitting: Physical Therapy

## 2017-03-13 ENCOUNTER — Encounter: Payer: Self-pay | Admitting: Physical Therapy

## 2017-03-13 DIAGNOSIS — M25512 Pain in left shoulder: Secondary | ICD-10-CM | POA: Diagnosis not present

## 2017-03-13 DIAGNOSIS — R293 Abnormal posture: Secondary | ICD-10-CM

## 2017-03-13 DIAGNOSIS — M25511 Pain in right shoulder: Secondary | ICD-10-CM | POA: Diagnosis not present

## 2017-03-13 DIAGNOSIS — G8929 Other chronic pain: Secondary | ICD-10-CM | POA: Diagnosis not present

## 2017-03-13 DIAGNOSIS — R531 Weakness: Secondary | ICD-10-CM

## 2017-03-13 NOTE — Patient Instructions (Addendum)
Scapula Adduction With Pectorals, Low - pretend you are two inches taller than you are.    Stand in doorframe with palms against frame and arms at 45. Step forward and squeeze shoulder blades. Hold _30__ seconds. Repeat __1_ times per session. Do _2__ sessions per day.  Scapula Adduction With Pectorals, Mid-Range, pretend you are two inches taller than you are   Stand in doorframe with palms against frame and arms at 90. Step forward and squeeze shoulder blades. Hold __30_ seconds. Repeat __1_ times per session. Do __2_ sessions per day.  Flexibility: Neck Retraction    Pull head straight back, keeping eyes and jaw level. Repeat __10__ times per set. Do __1__ sets per session. Do _2-4___ sessions per day.  Scapular Retraction (Standing)    With arms at sides, pinch shoulder blades together. Keep good alignment, pretend you are two inches taller than you are.  Repeat __10__ times per set. Do __1__ sets per session. Do _2-4___ sessions per day.  Resisted External Rotation: in Neutral - Bilateral    Sit or stand, tubing in both hands, elbows at sides, bent to 90, forearms forward. Pinch shoulder blades together and rotate forearms out. Keep elbows at sides. Repeat __10__ times per set. Do __2-3__ sets per session. Do _1___ sessions per day. Copyright  [employer]. All rights reserved.

## 2017-03-13 NOTE — Therapy (Signed)
[place], [place], [address] Phone: [phone]   Fax:  [phone]  Physical Therapy Evaluation  Patient Details  Name: Belinda Owens MRN: [id number] Date of Birth: 10-05-1932 Referring Provider: Dr Dianah Field  Encounter Date: 03/13/2017      PT End of Session - 03/13/17 1226    Visit Number 1   Number of Visits 8   Date for PT Re-Evaluation 04/10/17   PT Start Time 1147   PT Stop Time 1226   PT Time Calculation (min) 39 min   Activity Tolerance Patient tolerated treatment well  had slight increase in Lt deltoid pain with cervical retractions.       Past Medical History:  Diagnosis Date  . Fibrocystic breast changes of both breasts    Gets yearly mammo and [place]  . History of rheumatic fever     Past Surgical History:  Procedure Laterality Date  . BREAST BIOPSY    . MENISCUS REPAIR Right   . RECONSTRUCTION OF EYELID Bilateral 12/14/2016  . ROTATOR CUFF REPAIR Right     There were no vitals filed for this visit.       Subjective Assessment - 03/13/17 1146    Subjective Pt reports she has had bilat shoulder pain since around the holidays, she had bilat injections in her shoulders in March and then another in the Rt shoulder last week.  Since the last injection her Rt shoulder doesn't hurt at all and she only had some motion in the Lt shoulder now.    Pertinent History 18 yrs ago Rt RTC repair. , Currently does walking for exercise. h/o cervical pain about a year ago, had chiropractic care and it helped.     Diagnostic tests pt doesnt remember   Patient Stated Goals keep her shoulders in good shape. Help her friend by lifting/pushing oxygen tank into the car and move her walker around.    Currently in Pain? 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      Observation/Other Assessments   Focus on Therapeutic Outcomes (FOTO)  42% limited     Posture/Postural Control   Posture/Postural Control Postural limitations   Postural Limitations Rounded Shoulders;Forward head;Increased thoracic kyphosis     ROM / Strength   AROM / PROM / Strength AROM;Strength     AROM   AROM Assessment Site Cervical;Shoulder   Right/Left Shoulder Left;Right   Right Shoulder Flexion 160 Degrees   Right Shoulder ABduction 170 Degrees   Left Shoulder Flexion 140 Degrees   Left Shoulder ABduction 170 Degrees   Cervical Flexion 32   Cervical Extension 28  Cervical - Right Side Bend 8   Cervical - Left Side Bend 22   Cervical - Right Rotation 62   Cervical - Left Rotation 53     Strength   Strength Assessment Site Elbow;Shoulder   Right/Left Shoulder --  bilat WNL except ER 4+/5   Right/Left Elbow --  bilat WNL     Flexibility   Soft Tissue Assessment /Muscle Length --  tight bilat pecs.      Palpation   Palpation comment no pain on palpation bilat shoulders.                    [facility] Adult PT Treatment/Exercise - 03/13/17 0001      Exercises   Exercises Shoulder     Shoulder Exercises: Seated   External Rotation Strengthening;Both;20 reps;Theraband   Theraband Level (Shoulder External Rotation) Level 2 (Red)   Other Seated Exercises cervical and scapular retraction, VC for form      Shoulder Exercises: Stretch   Other Shoulder Stretches low and mid doorway stretch x 30 sec                 PT Education - 03/13/17 1221    Education provided Yes   Education Details HEP and posture   Person(s) Educated Patient   Methods Handout;Demonstration;Explanation   Comprehension Verbalized understanding;Returned demonstration             PT Long Term Goals - 03/13/17 1306      PT LONG TERM GOAL #1   Title I in advanced HEP( 04/10/17)    Time 4   Period Weeks   Status New     PT LONG TERM GOAL #2   Title demo painfree cervical and shoulder ROM WFL in all directions ( 04/10/17)    Time 4   Period Weeks   Status New     PT LONG TERM GOAL #3   Title increase bilat shoulder ER and upper back =/> 5-/5 to support upright posture. ( 04/09/18)     Time 4   Period Weeks   Status New     PT LONG TERM GOAL #4   Title Decrease FOTO score to =/< 33% limited; to CJ level ( 04/09/18)    Time 4   Period Weeks   Status New               Plan - 03/13/17 1303    Clinical Impression Statement 83 yo female presents for low complextity PT eval for bilat shoulder pain that began about 7 months ago. She has had one injection in her Lt shoulder, two injections in the Rt shoulder the last one a week ago.  Since this her pain has subsided a lot and she is able to move her arms around. She has significant postural changes with forward head, rounded shoulders, tight pecs and some weakness in her RTC and upper back.    Rehab Potential Excellent   PT Frequency 2x / week   PT Duration 4 weeks   PT Treatment/Interventions Moist Heat;Ultrasound;Therapeutic exercise;Dry needling;Manual techniques;Neuromuscular re-education;Cryotherapy;Electrical Stimulation;Iontophoresis 4mg /ml Dexamethasone;Patient/family education   PT Next Visit Plan postural re-ed ther ex, manual work to upper traps and pecs.    Consulted and Agree with Plan of Care Patient      Patient will benefit from  skilled therapeutic intervention in order to improve the following deficits and impairments:  Postural dysfunction, Decreased strength, Pain  Visit Diagnosis: Weakness generalized - Plan: PT plan of care  cert/re-cert  Chronic left shoulder pain - Plan: PT plan of care cert/re-cert  Chronic right shoulder pain - Plan: PT plan of care cert/re-cert  Abnormal posture - Plan: PT plan of care cert/re-cert     Problem List Patient Active Problem List   Diagnosis Date Noted  . Primary osteoarthritis of left knee 06/08/2016  . Left ankle injury 02/07/2016  . Osteopenia 09/29/2015  . Trochanteric bursitis of right hip 05/05/2015  . Ear ringing sound 04/27/2015  . Bilateral shoulder pain 03/16/2015  . Degenerative disc disease, cervical 03/16/2015  . Insomnia 03/09/2015  . Hyperlipidemia 02/03/2015  . Essential hypertension 02/03/2015  . DDD (degenerative disc disease), lumbar 02/03/2015  . GERD (gastroesophageal reflux disease) 02/03/2015  . Anxiety state 02/03/2015  . Fibrocystic breast changes of both breasts 02/03/2015  . Microscopic hematuria 02/03/2015  . Allergic bronchitis 02/03/2015    Jeral Pinch PT 03/13/2017, 1:15 PM  [facility] [place], [place], [address] Phone: [phone]   Fax:  [phone]  Name: Belinda Owens MRN: [id number] Date of Birth: December 02, 1931

## 2017-03-15 ENCOUNTER — Other Ambulatory Visit: Payer: Self-pay | Admitting: Family Medicine

## 2017-03-21 ENCOUNTER — Ambulatory Visit (INDEPENDENT_AMBULATORY_CARE_PROVIDER_SITE_OTHER): Payer: Medicare Other | Admitting: Physical Therapy

## 2017-03-21 DIAGNOSIS — G8929 Other chronic pain: Secondary | ICD-10-CM | POA: Diagnosis not present

## 2017-03-21 DIAGNOSIS — R531 Weakness: Secondary | ICD-10-CM | POA: Diagnosis not present

## 2017-03-21 DIAGNOSIS — M25612 Stiffness of left shoulder, not elsewhere classified: Secondary | ICD-10-CM | POA: Diagnosis not present

## 2017-03-21 DIAGNOSIS — M542 Cervicalgia: Secondary | ICD-10-CM

## 2017-03-21 DIAGNOSIS — R293 Abnormal posture: Secondary | ICD-10-CM

## 2017-03-21 DIAGNOSIS — M25512 Pain in left shoulder: Secondary | ICD-10-CM

## 2017-03-21 DIAGNOSIS — M25511 Pain in right shoulder: Secondary | ICD-10-CM | POA: Diagnosis not present

## 2017-03-21 NOTE — Therapy (Signed)
[place], [place], [address] Phone: [phone]   Fax:  [phone]  Physical Therapy Treatment  Patient Details  Name: Belinda Owens MRN: [id number] Date of Birth: 23-Aug-1932 Referring Provider: Dr. Dianah Field  Encounter Date: 03/21/2017      PT End of Session - 03/21/17 1022    Visit Number 2   Number of Visits 8   Date for PT Re-Evaluation 04/10/17   PT Start Time 1020   PT Stop Time 0932   PT Time Calculation (min) 38 min   Activity Tolerance Patient tolerated treatment well;No increased pain      Past Medical History:  Diagnosis Date  . Fibrocystic breast changes of both breasts    Gets yearly mammo and [place]  . History of rheumatic fever     Past Surgical History:  Procedure Laterality Date  . BREAST BIOPSY    . MENISCUS REPAIR Right   . RECONSTRUCTION OF EYELID Bilateral 12/14/2016  . ROTATOR CUFF REPAIR Right     There were no vitals filed for this visit.          [facility] PT Assessment - 03/21/17 0001      Assessment   Medical Diagnosis bilat shoulder pain   Referring Provider Dr. Dianah Field   Onset Date/Surgical Date 09/13/16   Hand Dominance Right   Next MD Visit 04/06/17   Prior Therapy not since this new flare up     AROM   Cervical - Right Side Bend 15   Cervical - Left Side Bend 18          [facility] Adult PT Treatment/Exercise - 03/21/17 0001      Exercises   Exercises Neck;Shoulder     Shoulder Exercises: Seated   Row Both;20 reps;Theraband   Theraband Level (Shoulder Row) Level 2 (Red)   External Rotation Strengthening;Both;Theraband;10 reps  2 sets   Theraband Level (Shoulder External Rotation) Level 2 (Red)   Other Seated Exercises chin tucks x 5 sec hold x 10 reps; scap squeeze x 10 sec x 10 reps; tactile cues for form     Shoulder Exercises: Pulleys   Flexion 3 minutes  10 sec hold, bilat     Shoulder Exercises: Stretch   Other Shoulder  Stretches low and mid doorway stretch x 30 sec x 2 reps with BUE on doorway;  midlevel repeated with unilateral arm up x 30 sec each.      Neck Exercises: Stretches   Upper Trapezius Stretch 4 reps;20 seconds   Neck Stretch 3 reps  cervical diagonals, sitting                PT Education - 03/21/17 1210    Education provided Yes   Education Details HEP   Person(s) Educated Patient   Methods Explanation;Handout;Demonstration;Tactile cues   Comprehension Returned demonstration;Verbalized understanding             PT Long Term Goals - 03/21/17 1117      PT LONG TERM GOAL #1   Title I in advanced HEP( 04/10/17)    Time 4   Period Weeks   Status On-going     PT LONG TERM GOAL #2   Title demo painfree cervical and shoulder ROM WFL in all directions ( 04/10/17)    Time 4   Period Weeks   Status On-going     PT LONG TERM GOAL #3   Title increase bilat shoulder ER and upper back =/> 5-/5 to  support upright posture. ( 04/09/18)     Time 4   Period Weeks   Status On-going     PT LONG TERM GOAL #4   Title Decrease FOTO score to =/< 33% limited; to CJ level ( 04/09/18)    Time 4   Period Weeks   Status On-going               Plan - 03/21/17 1118    Clinical Impression Statement Pt required frequent verbal and tactile cues for improved, upright posture (often head is forward, shoulders rounded).  She performed all exercises without any production of pain.  Time spent educating pt on how to squeeze exercise into her daily routine, since she is "not an exerciser".  She demonstrated slight improvement in cervical lateral flexion ROM; continues to be limited bilaterally.  Progressing towards goals.    Rehab Potential Excellent   PT Frequency 2x / week   PT Duration 4 weeks   PT Treatment/Interventions --   PT Next Visit Plan Continue postural re-ed ther ex, manual work to upper traps and pecs.    Consulted and Agree with Plan of Care Patient      Patient will  benefit from skilled therapeutic intervention in order to improve the following deficits and impairments:  Postural dysfunction, Decreased strength, Pain  Visit Diagnosis: Weakness generalized  Chronic left shoulder pain  Chronic right shoulder pain  Abnormal posture  Stiffness of shoulder joint, left  Cervical pain (neck)     Problem List Patient Active Problem List   Diagnosis Date Noted  . Primary osteoarthritis of left knee 06/08/2016  . Left ankle injury 02/07/2016  . Osteopenia 09/29/2015  . Trochanteric bursitis of right hip 05/05/2015  . Ear ringing sound 04/27/2015  . Bilateral shoulder pain 03/16/2015  . Degenerative disc disease, cervical 03/16/2015  . Insomnia 03/09/2015  . Hyperlipidemia 02/03/2015  . Essential hypertension 02/03/2015  . DDD (degenerative disc disease), lumbar 02/03/2015  . GERD (gastroesophageal reflux disease) 02/03/2015  . Anxiety state 02/03/2015  . Fibrocystic breast changes of both breasts 02/03/2015  . Microscopic hematuria 02/03/2015  . Allergic bronchitis 02/03/2015   Kerin Perna, PTA 03/21/17 12:11 PM  [facility] [place], [place], [address] Phone: [phone]   Fax:  [phone]  Name: Latoiya Maradiaga MRN: [id number] Date of Birth: 1932-06-20

## 2017-03-21 NOTE — Patient Instructions (Signed)
AROM: Lateral Neck Flexion    Slowly tilt head toward one shoulder, then the other. Hold each position __15-30__ seconds. Repeat __3__ times per set. Do _1___ sets per session. Do __2__ sessions per day. * Can place towel over shoulder to anchor, or sit on palm.   CHEST: Doorway, Unilateral - Standing   Step one foot forward.  Standing in doorway, place one hand on wall with elbow bent at shoulder height. Lean forward. Hold __15-30_ seconds. __2_ reps per set, __2_ sets per day   [place] [facility] [place], [place] [address]  [phone] (office) [phone] (fax)

## 2017-03-22 ENCOUNTER — Other Ambulatory Visit: Payer: Self-pay | Admitting: Family Medicine

## 2017-03-23 ENCOUNTER — Telehealth: Payer: Self-pay | Admitting: *Deleted

## 2017-03-23 ENCOUNTER — Ambulatory Visit (INDEPENDENT_AMBULATORY_CARE_PROVIDER_SITE_OTHER): Payer: Medicare Other | Admitting: Physical Therapy

## 2017-03-23 DIAGNOSIS — M25511 Pain in right shoulder: Secondary | ICD-10-CM

## 2017-03-23 DIAGNOSIS — R531 Weakness: Secondary | ICD-10-CM | POA: Diagnosis not present

## 2017-03-23 DIAGNOSIS — M25512 Pain in left shoulder: Secondary | ICD-10-CM | POA: Diagnosis not present

## 2017-03-23 DIAGNOSIS — R293 Abnormal posture: Secondary | ICD-10-CM

## 2017-03-23 DIAGNOSIS — G8929 Other chronic pain: Secondary | ICD-10-CM | POA: Diagnosis not present

## 2017-03-23 NOTE — Patient Instructions (Addendum)
Thoracic Lift    Press shoulder blades down. Then lift mid-thoracic spine (area between the shoulder blades). Lift the breastbone slightly. Keep neck relaxed.  Hold _10__ seconds. Relax. Repeat __10_ times.  [place]: Double Arm    Arms near sides, palms up. Press both arms lightly into bed, slide arms out to side and up alongside head. Keep contact with floor throughout motion. At maximal position, lengthen arms. Hold __5-10_ seconds. Relax. Slide arms back to start. Repeat _10__ times.  Head Press With [place] chin SLIGHTLY toward chest, keep mouth closed. Feel weight on back of head. Increase weight by pressing head down. Hold ___ seconds. Relax. Repeat ___ times.  Over Head Pull: Narrow Grip        On back, knees bent, feet flat, band across thighs, elbows straight but relaxed. Pull hands apart (start). Keeping elbows straight, bring arms up and over head, hands toward floor. Keep pull steady on band. Hold momentarily. Return slowly, keeping pull steady, back to start. Repeat _10__ times. Band color _yellow/red_____   Side Pull: Double Arm   On back, knees bent, feet flat. Arms perpendicular to body, shoulder level, elbows straight but relaxed. Pull arms out to sides, elbows straight. Resistance band comes across collarbones, hands toward floor. Hold momentarily. Slowly return to starting position. Repeat _10__ times. Band color __yellow/red___   Sash   On back, knees bent, feet flat, left hand on left hip, right hand above left. Pull right arm DIAGONALLY (hip to shoulder) across chest. Bring right arm along head toward floor. Hold momentarily. Slowly return to starting position. Repeat _10__ times. Do with left arm. Band color ___yellow/red___   Shoulder Rotation: Double Arm   On back, knees bent, feet flat, elbows tucked at sides, bent 90, hands palms up. Pull hands apart and down toward floor, keeping elbows near sides. Hold momentarily. Slowly  return to starting position. Repeat _10__ times. Band color __yellow/red___    [facility] [place], [place] [address]  [phone] (office) [phone] (fax)

## 2017-03-23 NOTE — Telephone Encounter (Signed)
Error

## 2017-03-23 NOTE — Therapy (Signed)
[place], [place], [address] Phone: [phone]   Fax:  [phone]  Physical Therapy Treatment  Patient Details  Name: Belinda Owens MRN: [id number] Date of Birth: 1932-04-14 Referring Provider: Dr. Dianah Field  Encounter Date: 03/23/2017      PT End of Session - 03/23/17 1108    Visit Number 3   Number of Visits 8   Date for PT Re-Evaluation 04/10/17   PT Start Time 1105   PT Stop Time 1147   PT Time Calculation (min) 42 min   Activity Tolerance Patient tolerated treatment well   Behavior During Therapy [facility] for tasks assessed/performed      Past Medical History:  Diagnosis Date  . Fibrocystic breast changes of both breasts    Gets yearly mammo and [place]  . History of rheumatic fever     Past Surgical History:  Procedure Laterality Date  . BREAST BIOPSY    . MENISCUS REPAIR Right   . RECONSTRUCTION OF EYELID Bilateral 12/14/2016  . ROTATOR CUFF REPAIR Right     There were no vitals filed for this visit.      Subjective Assessment - 03/23/17 1109    Subjective Belinda Owens reports her neck is a little more stiff today and her Lt shoulder is sore.  She has done her exercises each day.     Patient Stated Goals keep her shoulders in good shape. Help her friend by lifting/pushing oxygen tank into the car and move her walker around.    Currently in Pain? Yes   Pain Score 5    Pain Location Shoulder   Pain Orientation Left   Pain Descriptors / Indicators Aching   Aggravating Factors  reaching, lifting    Pain Relieving Factors rubbing it            [facility] PT Assessment - 03/23/17 0001      Precautions   Precaution Comments pt reports adhesive allergy          [facility] Adult PT Treatment/Exercise - 03/23/17 0001      Shoulder Exercises: Supine   Horizontal ABduction Strengthening;Both;10 reps;Theraband   Theraband Level (Shoulder Horizontal ABduction) Level 2 (Red)   External  Rotation Strengthening;Both;10 reps;Theraband   Theraband Level (Shoulder External Rotation) Level 1 (Yellow)   Flexion Strengthening;Both;10 reps;Theraband  overhead pull   Theraband Level (Shoulder Flexion) Level 1 (Yellow)   Other Supine Exercises snow angels with BUE x 10 reps, followed by elbow press x 3 reps    Other Supine Exercises shoulder press (scap retraction) x 5 sec hold x 10 reps; head press x 5 sec x 10 reps      Shoulder Exercises: ROM/Strengthening   UBE (Upper Arm Bike) L1:  1.5 min each direction.      Shoulder Exercises: Stretch   Other Shoulder Stretches mid level doorway stretch x 30 sec, repeated with unilateral UE x 30 sec. Pt c/o increase Lt shoulder/arm pain.      Manual Therapy   Manual Therapy Soft tissue mobilization   Manual therapy comments Biofreeze applied to Lt upper arm/prox elbow at end of session.     Soft tissue mobilization to Lt upper arm to shoulder, lateral/posterior aspect, TPR to distal tricep on Lt arm             PT Education - 03/23/17 1136    Education provided Yes   Education Details HEP - changed to decompression position, neck/scap stabilization. Briefly educated on DN (no  handout issued for this)   Person(s) Educated Patient   Methods Explanation;Handout;Tactile cues;Demonstration   Comprehension Verbalized understanding;Returned demonstration             PT Long Term Goals - 03/21/17 1117      PT LONG TERM GOAL #1   Title I in advanced HEP( 04/10/17)    Time 4   Period Weeks   Status On-going     PT LONG TERM GOAL #2   Title demo painfree cervical and shoulder ROM WFL in all directions ( 04/10/17)    Time 4   Period Weeks   Status On-going     PT LONG TERM GOAL #3   Title increase bilat shoulder ER and upper back =/> 5-/5 to support upright posture. ( 04/09/18)     Time 4   Period Weeks   Status On-going     PT LONG TERM GOAL #4   Title Decrease FOTO score to =/< 33% limited; to CJ level ( 04/09/18)    Time  4   Period Weeks   Status On-going               Plan - 03/23/17 1152    Clinical Impression Statement Pt reported increased neck and Lt shoulder pain since initiating exercises.  Pt reported improved tolerance with performance of exercises in supine position.  Pt reported some increased pain in Lt distal arm (above elbow) with resisted horiz abdct and ER;  pain reduced with manual therapy.  Noted some banding/tightness in musculature in posterior/lateral Lt shoulder; may benefit from DN.  Pt progressing towards goals.    Rehab Potential Excellent   PT Frequency 2x / week   PT Duration 4 weeks   PT Treatment/Interventions Moist Heat;Ultrasound;Therapeutic exercise;Dry needling;Manual techniques;Neuromuscular re-education;Cryotherapy;Electrical Stimulation;Iontophoresis 4mg /ml Dexamethasone;Patient/family education   PT Next Visit Plan Continue postural re-ed ther ex, manual work or DN to upper quadrant.    Consulted and Agree with Plan of Care Patient      Patient will benefit from skilled therapeutic intervention in order to improve the following deficits and impairments:  Postural dysfunction, Decreased strength, Pain  Visit Diagnosis: No diagnosis found.     Problem List Patient Active Problem List   Diagnosis Date Noted  . Primary osteoarthritis of left knee 06/08/2016  . Left ankle injury 02/07/2016  . Osteopenia 09/29/2015  . Trochanteric bursitis of right hip 05/05/2015  . Ear ringing sound 04/27/2015  . Bilateral shoulder pain 03/16/2015  . Degenerative disc disease, cervical 03/16/2015  . Insomnia 03/09/2015  . Hyperlipidemia 02/03/2015  . Essential hypertension 02/03/2015  . DDD (degenerative disc disease), lumbar 02/03/2015  . GERD (gastroesophageal reflux disease) 02/03/2015  . Anxiety state 02/03/2015  . Fibrocystic breast changes of both breasts 02/03/2015  . Microscopic hematuria 02/03/2015  . Allergic bronchitis 02/03/2015   Kerin Perna,  PTA 03/23/17 12:07 PM  [place], [place], [address] Phone: [phone]   Fax:  [phone]  Name: Belinda Owens MRN: [id number] Date of Birth: [birth year]

## 2017-03-27 ENCOUNTER — Encounter: Payer: Medicare Other | Admitting: Physical Therapy

## 2017-03-28 ENCOUNTER — Other Ambulatory Visit: Payer: Self-pay | Admitting: Family Medicine

## 2017-03-30 ENCOUNTER — Encounter: Payer: Self-pay | Admitting: Physical Therapy

## 2017-03-30 ENCOUNTER — Ambulatory Visit (INDEPENDENT_AMBULATORY_CARE_PROVIDER_SITE_OTHER): Payer: Medicare Other | Admitting: Physical Therapy

## 2017-03-30 DIAGNOSIS — R293 Abnormal posture: Secondary | ICD-10-CM | POA: Diagnosis not present

## 2017-03-30 DIAGNOSIS — M25612 Stiffness of left shoulder, not elsewhere classified: Secondary | ICD-10-CM | POA: Diagnosis not present

## 2017-03-30 DIAGNOSIS — R531 Weakness: Secondary | ICD-10-CM | POA: Diagnosis not present

## 2017-03-30 DIAGNOSIS — G8929 Other chronic pain: Secondary | ICD-10-CM | POA: Diagnosis not present

## 2017-03-30 DIAGNOSIS — M25511 Pain in right shoulder: Secondary | ICD-10-CM

## 2017-03-30 DIAGNOSIS — M25512 Pain in left shoulder: Secondary | ICD-10-CM

## 2017-03-30 NOTE — Therapy (Signed)
[place], [place], [address] Phone: [phone]   Fax:  [phone]  Physical Therapy Treatment  Patient Details  Name: Belinda Owens MRN: [id number] Date of Birth: [birth year] Referring Provider: Dr Darene Lamer  Encounter Date: 03/30/2017      PT End of Session - 03/30/17 1009    Visit Number 4   Number of Visits 8   Date for PT Re-Evaluation 04/10/17   PT Start Time 1010   PT Stop Time 1109   PT Time Calculation (min) 59 min   Activity Tolerance Patient tolerated treatment well      Past Medical History:  Diagnosis Date  . Fibrocystic breast changes of both breasts    Gets yearly mammo and [place]  . History of rheumatic fever     Past Surgical History:  Procedure Laterality Date  . BREAST BIOPSY    . MENISCUS REPAIR Right   . RECONSTRUCTION OF EYELID Bilateral 12/14/2016  . ROTATOR CUFF REPAIR Right     There were no vitals filed for this visit.      Subjective Assessment - 03/30/17 1014    Subjective Doing well with HEP, alternating them at home. Loss her initial HEP and asked for another copy   Patient Stated Goals keep her shoulders in good shape. Help her friend by lifting/pushing oxygen tank into the car and move her walker around.    Currently in Pain? No/denies            [facility] PT Assessment - 03/30/17 0001      Assessment   Medical Diagnosis bilat shoulder pain   Referring Provider Dr T   Onset Date/Surgical Date 09/13/16   Hand Dominance Right   Next MD Visit 04/06/17     Precautions   Precaution Comments pt reports adhesive allergy     AROM   AROM Assessment Site Shoulder   Right Shoulder Flexion 160 Degrees  no pain   Left Shoulder Flexion 142 Degrees  no pain                     [facility] Adult PT Treatment/Exercise - 03/30/17 0001      Exercises   Exercises Shoulder     Neck Exercises: Supine   Neck Retraction 10 reps;5 secs  into thick pillow   Other Supine Exercise 10 reps deep neck flexor ex. VC for form     Shoulder Exercises: Supine   Other Supine Exercises lying on green pool noodle with pillow under head     Shoulder Exercises: Seated   Other Seated Exercises 2x15 bilat shoulder Y's with VC for upright posture     Shoulder Exercises: Sidelying   External Rotation Strengthening;Both;15 reps;Weights  2 reps, towel under elbow   External Rotation Weight (lbs) 2#, 3 to heavy    Other Sidelying Exercises 2x15 empty can each side with 2#, in small ROM     Shoulder Exercises: ROM/Strengthening   UBE (Upper Arm Bike) L2 x 4' alt FWD/BWD     Modalities   Modalities Moist Heat     Moist Heat Therapy   Number Minutes Moist Heat 12 Minutes   Moist Heat Location Cervical;Shoulder  bilat shoulders                     PT Long Term Goals - 03/30/17 1030      PT LONG TERM GOAL #1   Title I in advanced HEP( 04/10/17)  Status On-going     PT LONG TERM GOAL #2   Title demo painfree cervical and shoulder ROM WFL in all directions ( 04/10/17)    Status Partially Met  shoulders met, neck still painful     PT LONG TERM GOAL #3   Title increase bilat shoulder ER and upper back =/> 5-/5 to support upright posture. ( 04/09/18)     Status On-going     PT LONG TERM GOAL #4   Title Decrease FOTO score to =/< 33% limited; to CJ level ( 04/09/18)    Status On-going               Plan - 03/30/17 1045    Clinical Impression Statement Belinda Owens continues to have some referred pain into her Lt deltoid.  Her shoulder ROM is now painfree.  She is becoming more aware of her posture and is tolerating holding herself up better.  Partially met one goal.    Rehab Potential Excellent   PT Frequency 2x / week   PT Duration 4 weeks   PT Treatment/Interventions Moist Heat;Ultrasound;Therapeutic exercise;Dry needling;Manual techniques;Neuromuscular re-education;Cryotherapy;Electrical Stimulation;Iontophoresis 15m/ml  Dexamethasone;Patient/family education   PT Next Visit Plan possible DN and manual work to next.    Consulted and Agree with Plan of Care Patient      Patient will benefit from skilled therapeutic intervention in order to improve the following deficits and impairments:  Postural dysfunction, Decreased strength, Pain  Visit Diagnosis: Weakness generalized  Chronic left shoulder pain  Chronic right shoulder pain  Abnormal posture  Stiffness of shoulder joint, left     Problem List Patient Active Problem List   Diagnosis Date Noted  . Primary osteoarthritis of left knee 06/08/2016  . Left ankle injury 02/07/2016  . Osteopenia 09/29/2015  . Trochanteric bursitis of right hip 05/05/2015  . Ear ringing sound 04/27/2015  . Bilateral shoulder pain 03/16/2015  . Degenerative disc disease, cervical 03/16/2015  . Insomnia 03/09/2015  . Hyperlipidemia 02/03/2015  . Essential hypertension 02/03/2015  . DDD (degenerative disc disease), lumbar 02/03/2015  . GERD (gastroesophageal reflux disease) 02/03/2015  . Anxiety state 02/03/2015  . Fibrocystic breast changes of both breasts 02/03/2015  . Microscopic hematuria 02/03/2015  . Allergic bronchitis 02/03/2015    SJeral PinchPT  03/30/2017, 10:58 AM  [facility] [address]Phone: [phone]  Fax:  [phone] Name: Belinda YeatsMRN: [id number] of Birth: 102-Jan-1933

## 2017-04-03 ENCOUNTER — Ambulatory Visit (INDEPENDENT_AMBULATORY_CARE_PROVIDER_SITE_OTHER): Payer: Medicare Other | Admitting: Physical Therapy

## 2017-04-03 ENCOUNTER — Encounter: Payer: Self-pay | Admitting: Physical Therapy

## 2017-04-03 DIAGNOSIS — M25612 Stiffness of left shoulder, not elsewhere classified: Secondary | ICD-10-CM

## 2017-04-03 DIAGNOSIS — R293 Abnormal posture: Secondary | ICD-10-CM | POA: Diagnosis not present

## 2017-04-03 DIAGNOSIS — M25512 Pain in left shoulder: Secondary | ICD-10-CM

## 2017-04-03 DIAGNOSIS — M25511 Pain in right shoulder: Secondary | ICD-10-CM | POA: Diagnosis not present

## 2017-04-03 DIAGNOSIS — M542 Cervicalgia: Secondary | ICD-10-CM | POA: Diagnosis not present

## 2017-04-03 DIAGNOSIS — G8929 Other chronic pain: Secondary | ICD-10-CM

## 2017-04-03 DIAGNOSIS — R531 Weakness: Secondary | ICD-10-CM

## 2017-04-03 NOTE — Patient Instructions (Signed)

## 2017-04-03 NOTE — Therapy (Signed)
[place], [place], [address] Phone: [phone]   Fax:  [phone]  Physical Therapy Treatment  Patient Details  Name: Belinda Owens MRN: [id number] Date of Birth: [birth year] Referring Provider: Dr Darene Lamer  Encounter Date: 04/03/2017      PT End of Session - 04/03/17 1019    Visit Number 5   Number of Visits 8   Date for PT Re-Evaluation 04/10/17   PT Start Time 1018   PT Stop Time 1109   PT Time Calculation (min) 51 min      Past Medical History:  Diagnosis Date  . Fibrocystic breast changes of both breasts    Gets yearly mammo and [place]  . History of rheumatic fever     Past Surgical History:  Procedure Laterality Date  . BREAST BIOPSY    . MENISCUS REPAIR Right   . RECONSTRUCTION OF EYELID Bilateral 12/14/2016  . ROTATOR CUFF REPAIR Right     There were no vitals filed for this visit.      Subjective Assessment - 04/03/17 1019    Subjective [name] is still having a deep ache in her Lt deltiod.  She is interested in trying out the DN and manual work today.    Patient Stated Goals keep her shoulders in good shape. Help her friend by lifting/pushing oxygen tank into the car and move her walker around.    Currently in Pain? No/denies            [facility] PT Assessment - 04/03/17 0001      Assessment   Medical Diagnosis bilat shoulder pain   Referring Provider Dr T   Onset Date/Surgical Date 09/13/16   Next MD Visit 04/06/17     Strength   Right/Left Shoulder --  bilat ER /abduction 5/5                     [facility] Adult PT Treatment/Exercise - 04/03/17 0001      Shoulder Exercises: Standing   Row Strengthening;Both;15 reps;Theraband  2 sets    Theraband Level (Shoulder Row) Level 4 (Blue)   Retraction Strengthening;Both;15 reps;Theraband  2 sets, with some trunk rotation   Theraband Level (Shoulder Retraction) Level 4 (Blue)   Other Standing Exercises 2x15  shoulder shrugs, 2# leaning into noodle   Other Standing Exercises head presses into rolled up pillow case leaning on noodle.      Shoulder Exercises: ROM/Strengthening   UBE (Upper Arm Bike) L2 x 4' alt FWD/BWD     Modalities   Modalities Moist Heat     Moist Heat Therapy   Number Minutes Moist Heat 15 Minutes   Moist Heat Location Shoulder;Cervical     Manual Therapy   Manual Therapy Soft tissue mobilization   Soft tissue mobilization STW to Lt posterior shoulder and deltoid           Trigger Point Dry Needling - 04/03/17 1056    Consent Given? Yes   Education Handout Provided Yes   Muscles Treated Upper Body Subscapularis;Infraspinatus  deltiod all Lt    Infraspinatus Response Palpable increased muscle length;Twitch response elicited   Subscapularis Response Twitch response elicited;Palpable increased muscle length                   PT Long Term Goals - 04/03/17 1033      PT LONG TERM GOAL #1   Title I in advanced HEP( 04/10/17)    Status On-going  PT LONG TERM GOAL #2   Title demo painfree cervical and shoulder ROM WFL in all directions ( 04/10/17)    Status On-going     PT LONG TERM GOAL #3   Title increase bilat shoulder ER and upper back =/> 5-/5 to support upright posture. ( 04/09/18)     Status Partially Met     PT LONG TERM GOAL #4   Title Decrease FOTO score to =/< 33% limited; to CJ level ( 04/09/18)    Status On-going               Plan - 04/03/17 1102    Clinical Impression Statement Belinda Owens has increased strength in her UE's and upper back, she has partially met one of her goals and progressing to the others.  She tolerated DN and manual work well with some relief of Lt deltoid symptoms.  She sees the MD this Friday.  If DN helped she would benefit from more visits to address this.    Rehab Potential Excellent   PT Frequency 2x / week   PT Duration 4 weeks   PT Treatment/Interventions Moist Heat;Ultrasound;Therapeutic  exercise;Dry needling;Manual techniques;Neuromuscular re-education;Cryotherapy;Electrical Stimulation;Iontophoresis 2m/ml Dexamethasone;Patient/family education   PT Next Visit Plan assess response to DN and continue postural correction   Consulted and Agree with Plan of Care Patient      Patient will benefit from skilled therapeutic intervention in order to improve the following deficits and impairments:  Postural dysfunction, Decreased strength, Pain  Visit Diagnosis: Weakness generalized  Chronic left shoulder pain  Chronic right shoulder pain  Abnormal posture  Stiffness of shoulder joint, left  Cervical pain (neck)     Problem List Patient Active Problem List   Diagnosis Date Noted  . Primary osteoarthritis of left knee 06/08/2016  . Left ankle injury 02/07/2016  . Osteopenia 09/29/2015  . Trochanteric bursitis of right hip 05/05/2015  . Ear ringing sound 04/27/2015  . Bilateral shoulder pain 03/16/2015  . Degenerative disc disease, cervical 03/16/2015  . Insomnia 03/09/2015  . Hyperlipidemia 02/03/2015  . Essential hypertension 02/03/2015  . DDD (degenerative disc disease), lumbar 02/03/2015  . GERD (gastroesophageal reflux disease) 02/03/2015  . Anxiety state 02/03/2015  . Fibrocystic breast changes of both breasts 02/03/2015  . Microscopic hematuria 02/03/2015  . Allergic bronchitis 02/03/2015    SJeral PinchPT  04/03/2017, 11:05 AM  [facility] [address]Phone: [phone]  Fax:  [phone] Name: Belinda GerkenMRN: [id number] of Birth: 1February 28, 1933

## 2017-04-06 ENCOUNTER — Ambulatory Visit (INDEPENDENT_AMBULATORY_CARE_PROVIDER_SITE_OTHER): Payer: Medicare Other | Admitting: Sports Medicine

## 2017-04-06 ENCOUNTER — Ambulatory Visit (INDEPENDENT_AMBULATORY_CARE_PROVIDER_SITE_OTHER): Payer: Medicare Other | Admitting: Physical Therapy

## 2017-04-06 DIAGNOSIS — G8929 Other chronic pain: Secondary | ICD-10-CM

## 2017-04-06 DIAGNOSIS — R293 Abnormal posture: Secondary | ICD-10-CM

## 2017-04-06 DIAGNOSIS — M25511 Pain in right shoulder: Secondary | ICD-10-CM

## 2017-04-06 DIAGNOSIS — R531 Weakness: Secondary | ICD-10-CM

## 2017-04-06 DIAGNOSIS — R3129 Other microscopic hematuria: Secondary | ICD-10-CM

## 2017-04-06 DIAGNOSIS — M7061 Trochanteric bursitis, right hip: Secondary | ICD-10-CM

## 2017-04-06 DIAGNOSIS — M25512 Pain in left shoulder: Secondary | ICD-10-CM

## 2017-04-06 LAB — BASIC METABOLIC PANEL WITH GFR
Calcium: 10.1 mg/dL (ref 8.6–10.4)
Potassium: 3.7 mmol/L (ref 3.5–5.3)
Sodium: 141 mmol/L (ref 135–146)

## 2017-04-06 LAB — BASIC METABOLIC PANEL
BUN: 19 mg/dL (ref 7–25)
CO2: 28 mmol/L (ref 20–31)
Chloride: 102 mmol/L (ref 98–110)
Creat: 0.91 mg/dL — ABNORMAL HIGH (ref 0.60–0.88)
Glucose, Bld: 92 mg/dL (ref 65–99)

## 2017-04-06 NOTE — Assessment & Plan Note (Signed)
Pain-free after bilateral glenohumeral injections and a right subacromial injection, formal physical therapy has been the most efficacious.

## 2017-04-06 NOTE — Progress Notes (Signed)
  Subjective:    CC: Follow-up multiple issues  HPI: Trochanter bursitis: Resolved  Shoulder pain: Resolved  Hematuria: Persistent, ultrasound showed multiple renal cysts that are the likely cause but radiology did recommend renal protocol MRI. Patient is agreeable.  Past medical history:  Negative.  See flowsheet/record as well for more information.  Surgical history: Negative.  See flowsheet/record as well for more information.  Family history: Negative.  See flowsheet/record as well for more information.  Social history: Negative.  See flowsheet/record as well for more information.  Allergies, and medications have been entered into the medical record, reviewed, and no changes needed.   Review of Systems: No fevers, chills, night sweats, weight loss, chest pain, or shortness of breath.   Objective:    General: Well Developed, well nourished, and in no acute distress.  Neuro: Alert and oriented x3, extra-ocular muscles intact, sensation grossly intact.  HEENT: Normocephalic, atraumatic, pupils equal round reactive to light, neck supple, no masses, no lymphadenopathy, thyroid nonpalpable.  Skin: Warm and dry, no rashes. Cardiac: Regular rate and rhythm, no murmurs rubs or gallops, no lower extremity edema.  Respiratory: Clear to auscultation bilaterally. Not using accessory muscles, speaking in full sentences.  Impression and Recommendations:    Trochanteric bursitis of right hip Pain-free now.  Bilateral shoulder pain Pain-free after bilateral glenohumeral injections and a right subacromial injection, formal physical therapy has been the most efficacious.  Microscopic hematuria Multiple renal cysts that are likely the cause of her persistent microscopic hematuria. Has never had renal protocol MRI which is currently being recommended by radiology. After discussion of the benefits and alternatives the patient does desire to proceed with renal protocol MRI. She has had some  workup out of state but tells me she has never had an MRI.  I spent 25 minutes with this patient, greater than 50% was face-to-face time counseling regarding the above diagnoses

## 2017-04-06 NOTE — Therapy (Signed)
[place], [place], [address] Phone: [phone]   Fax:  [phone]  Physical Therapy Treatment  Patient Details  Name: Belinda Owens MRN: [id number] Date of Birth: May 13, 1932 Referring Provider: Dr Darene Lamer  Encounter Date: 04/06/2017      PT End of Session - 04/06/17 0857    Visit Number 6   Number of Visits 8   Date for PT Re-Evaluation 04/10/17   PT Start Time 0848   PT Stop Time 0937  MHP last 10 min    PT Time Calculation (min) 49 min   Activity Tolerance Patient tolerated treatment well   Behavior During Therapy [facility] for tasks assessed/performed      Past Medical History:  Diagnosis Date  . Fibrocystic breast changes of both breasts    Gets yearly mammo and [place]  . History of rheumatic fever     Past Surgical History:  Procedure Laterality Date  . BREAST BIOPSY    . MENISCUS REPAIR Right   . RECONSTRUCTION OF EYELID Bilateral 12/14/2016  . ROTATOR CUFF REPAIR Right     There were no vitals filed for this visit.      Subjective Assessment - 04/06/17 0850    Subjective Tomeeka reports she has the deep ache in her Lt shoulder, but it is no longer constant.  She feels the DN helped decrease tightness. she was able to lift 3 gallon of water for dispensor without pain for first time.    Patient Stated Goals keep her shoulders in good shape. Help her friend by lifting/pushing oxygen tank into the car and move her walker around.    Currently in Pain? Yes   Pain Score 2    Pain Location Shoulder   Pain Orientation Left   Pain Descriptors / Indicators Aching   Aggravating Factors  ?   Pain Relieving Factors DN           [facility] Adult PT Treatment/Exercise - 04/06/17 0001      Shoulder Exercises: Sidelying   External Rotation Strengthening;Left;10 reps;Weights  2 sets   External Rotation Weight (lbs) 2   Other Sidelying Exercises 2x10 empty can each side with 2#, in small ROM      Shoulder Exercises: Standing   Row Strengthening;Both;15 reps;Theraband  2 sets    Theraband Level (Shoulder Row) Level 4 (Blue)   Retraction Strengthening;Both;Theraband;10 reps  with trunk rotation and backwards step.    Theraband Level (Shoulder Retraction) Level 4 (Blue)   Other Standing Exercises 2x15 shoulder shrugs, 2# leaning into noodle   Other Standing Exercises head presses into rolled up pillow case leaning on noodle.      Shoulder Exercises: ROM/Strengthening   UBE (Upper Arm Bike) L2 x 4' alt FWD/BWD     Shoulder Exercises: Stretch   Other Shoulder Stretches low, and mid level doorway stretch x 20 sec x 3 reps each position, bilat, repeated with unilateral UE      Modalities   Modalities Moist Heat     Moist Heat Therapy   Number Minutes Moist Heat 15 Minutes   Moist Heat Location Shoulder;Cervical  posterior Lt distal arm     Manual Therapy   Soft tissue mobilization STM to Lt distal lateral elbow, Lt tricep.      Neck Exercises: Stretches   Upper Trapezius Stretch 2 reps;10 seconds                PT Education - 04/06/17 3790  Education provided Yes   Education Details verbally reviewed exercises for HEP.    Person(s) Educated Patient   Methods Explanation   Comprehension Verbalized understanding             PT Long Term Goals - 04/03/17 1033      PT LONG TERM GOAL #1   Title I in advanced HEP( 04/10/17)    Status On-going     PT LONG TERM GOAL #2   Title demo painfree cervical and shoulder ROM WFL in all directions ( 04/10/17)    Status On-going     PT LONG TERM GOAL #3   Title increase bilat shoulder ER and upper back =/> 5-/5 to support upright posture. ( 04/09/18)     Status Partially Met     PT LONG TERM GOAL #4   Title Decrease FOTO score to =/< 33% limited; to CJ level ( 04/09/18)    Status On-going               Plan - 04/06/17 0931    Clinical Impression Statement Pt had positive response to DN; noticable decrease  in tightness and reduced report of pain.  She tolerated all exercises without increase in symptoms, just fatigue.  Pt will benefit from additional session of DN and continued PT intervention to maximize functional mobility.    Rehab Potential Excellent   PT Frequency 2x / week   PT Duration 4 weeks   PT Treatment/Interventions Moist Heat;Ultrasound;Therapeutic exercise;Dry needling;Manual techniques;Neuromuscular re-education;Cryotherapy;Electrical Stimulation;Iontophoresis 41m/ml Dexamethasone;Patient/family education   PT Next Visit Plan DN and continued postural strengthening.    Consulted and Agree with Plan of Care Patient      Patient will benefit from skilled therapeutic intervention in order to improve the following deficits and impairments:  Postural dysfunction, Decreased strength, Pain  Visit Diagnosis: Weakness generalized  Chronic left shoulder pain  Chronic right shoulder pain  Abnormal posture     Problem List Patient Active Problem List   Diagnosis Date Noted  . Primary osteoarthritis of left knee 06/08/2016  . Left ankle injury 02/07/2016  . Osteopenia 09/29/2015  . Trochanteric bursitis of right hip 05/05/2015  . Ear ringing sound 04/27/2015  . Bilateral shoulder pain 03/16/2015  . Degenerative disc disease, cervical 03/16/2015  . Insomnia 03/09/2015  . Hyperlipidemia 02/03/2015  . Essential hypertension 02/03/2015  . DDD (degenerative disc disease), lumbar 02/03/2015  . GERD (gastroesophageal reflux disease) 02/03/2015  . Anxiety state 02/03/2015  . Fibrocystic breast changes of both breasts 02/03/2015  . Microscopic hematuria 02/03/2015  . Allergic bronchitis 02/03/2015   JKerin Perna Owens 04/06/17 12:58 PM  [place] [address]Phone: [phone]  Fax:  [phone] Name: Belinda CorlMRN: [id number] of Birth: 107-Jun-1933

## 2017-04-06 NOTE — Assessment & Plan Note (Signed)
Pain-free now.

## 2017-04-06 NOTE — Assessment & Plan Note (Addendum)
Multiple renal cysts that are likely the cause of her persistent microscopic hematuria. Has never had renal protocol MRI which is currently being recommended by radiology. After discussion of the benefits and alternatives the patient does desire to proceed with renal protocol MRI. She has had some workup out of state but tells me she has never had an MRI.

## 2017-04-10 DIAGNOSIS — D132 Benign neoplasm of duodenum: Secondary | ICD-10-CM | POA: Insufficient documentation

## 2017-04-11 ENCOUNTER — Encounter: Payer: Self-pay | Admitting: Physical Therapy

## 2017-04-11 ENCOUNTER — Ambulatory Visit (INDEPENDENT_AMBULATORY_CARE_PROVIDER_SITE_OTHER): Payer: Medicare Other | Admitting: Physical Therapy

## 2017-04-11 DIAGNOSIS — M25612 Stiffness of left shoulder, not elsewhere classified: Secondary | ICD-10-CM

## 2017-04-11 DIAGNOSIS — R293 Abnormal posture: Secondary | ICD-10-CM | POA: Diagnosis not present

## 2017-04-11 DIAGNOSIS — M25511 Pain in right shoulder: Secondary | ICD-10-CM

## 2017-04-11 DIAGNOSIS — R531 Weakness: Secondary | ICD-10-CM

## 2017-04-11 DIAGNOSIS — M25512 Pain in left shoulder: Secondary | ICD-10-CM | POA: Diagnosis not present

## 2017-04-11 DIAGNOSIS — G8929 Other chronic pain: Secondary | ICD-10-CM | POA: Diagnosis not present

## 2017-04-11 NOTE — Therapy (Signed)
[facility] [place], [place], [address] Phone: [phone]   Fax:  [phone]  Physical Therapy Treatment  Patient Details  Name: Kinnedy Mongiello MRN: [id number] Date of Birth: 08/02/1932 Referring Provider: Dr Darene Lamer  Encounter Date: 04/11/2017      PT End of Session - 04/11/17 0937    Visit Number 7   Number of Visits 8   Date for PT Re-Evaluation 04/10/17   PT Start Time 0937   PT Stop Time 6962   PT Time Calculation (min) 55 min      Past Medical History:  Diagnosis Date  . Fibrocystic breast changes of both breasts    Gets yearly mammo and [place]  . History of rheumatic fever     Past Surgical History:  Procedure Laterality Date  . BREAST BIOPSY    . MENISCUS REPAIR Right   . RECONSTRUCTION OF EYELID Bilateral 12/14/2016  . ROTATOR CUFF REPAIR Right     There were no vitals filed for this visit.      Subjective Assessment - 04/11/17 0939    Subjective Leyah reports yesterday her shoulder was perfect, today it is good.    Patient Stated Goals keep her shoulders in good shape. Help her friend by lifting/pushing oxygen tank into the car and move her walker around.    Currently in Pain? No/denies  did have a small ache prior to coming to therapy in the Lt shoulder.             [facility] PT Assessment - 04/11/17 0001      Assessment   Medical Diagnosis bilat shoulder pain   Referring Provider Dr T   Onset Date/Surgical Date 09/13/16     AROM   AROM Assessment Site Shoulder   Right Shoulder Flexion 150 Degrees  standing   Left Shoulder Flexion 146 Degrees  standing   Cervical Flexion 40   Cervical Extension 32   Cervical - Right Rotation 55   Cervical - Left Rotation 55                     [facility] Adult PT Treatment/Exercise - 04/11/17 0001      Exercises   Exercises Shoulder     Shoulder Exercises: Supine   Other Supine Exercises on whole bolster, 10 reps shoulder  presses, 2x10 yellow band, overhead pull, ER, SASH, horizontal abduct     Shoulder Exercises: ROM/Strengthening   UBE (Upper Arm Bike) L2 x 4' alt FWD/BWD     Shoulder Exercises: Stretch   Other Shoulder Stretches low and mid doorway stetch x 30 sec  the straight arm bicep stretch.      Modalities   Modalities Moist Heat     Moist Heat Therapy   Number Minutes Moist Heat 15 Minutes   Moist Heat Location Shoulder;Cervical     Manual Therapy   Manual Therapy Soft tissue mobilization   Soft tissue mobilization STM to Lt deltoid          Trigger Point Dry Needling - 04/11/17 1019    Consent Given? Yes   Education Handout Provided No   Muscles Treated Upper Body --  deltiod Lt                   PT Long Term Goals - 04/11/17 0956      PT LONG TERM GOAL #1   Title I in advanced HEP( 04/10/17)    Status Achieved  PT LONG TERM GOAL #2   Title demo painfree cervical and shoulder ROM WFL in all directions ( 04/10/17)    Status Achieved     PT LONG TERM GOAL #3   Title increase bilat shoulder ER and upper back =/> 5-/5 to support upright posture. ( 04/09/18)     Status Partially Met     PT LONG TERM GOAL #4   Title Decrease FOTO score to =/< 33% limited; to CJ level ( 04/09/18)    Status Partially Met  33% limited               Plan - 27-Apr-2017 1243    Clinical Impression Statement Naomii is doing very well and pleased with her progress. She has met part of her goals and partially met the others. She is comfortabe with her HEP and will continue to perform that on her own. Ready for discharge.    PT Next Visit Plan D/C to HEP    Consulted and Agree with Plan of Care Patient      Patient will benefit from skilled therapeutic intervention in order to improve the following deficits and impairments:     Visit Diagnosis: Weakness generalized  Chronic left shoulder pain  Chronic right shoulder pain  Abnormal posture  Stiffness of shoulder joint,  left       [facility] PT PB G-CODES - 04/27/2017 1244    Functional Assessment Tool Used  FOTO and professional judgement   Functional Limitations Other PT primary   Other PT Primary Goal Status (G8916) At least 20 percent but less than 40 percent impaired, limited or restricted   Other PT Primary Discharge Status (X4503) At least 20 percent but less than 40 percent impaired, limited or restricted      Problem List Patient Active Problem List   Diagnosis Date Noted  . Primary osteoarthritis of left knee 06/08/2016  . Left ankle injury 02/07/2016  . Osteopenia 09/29/2015  . Trochanteric bursitis of right hip 05/05/2015  . Ear ringing sound 04/27/2015  . Bilateral shoulder pain 03/16/2015  . Degenerative disc disease, cervical 03/16/2015  . Insomnia 03/09/2015  . Hyperlipidemia 02/03/2015  . Essential hypertension 02/03/2015  . DDD (degenerative disc disease), lumbar 02/03/2015  . GERD (gastroesophageal reflux disease) 02/03/2015  . Anxiety state 02/03/2015  . Fibrocystic breast changes of both breasts 02/03/2015  . Microscopic hematuria 02/03/2015  . Allergic bronchitis 02/03/2015    Jeral Pinch PT  April 27, 2017, 12:46 PM  [facility] [place], [place], [address] Phone: [phone]   Fax:  [phone]  Name: Rhea Kaelin MRN: [id number] Date of Birth: [birth year]  PHYSICAL THERAPY DISCHARGE SUMMARY  Visits from Start of Care: 7  Current functional level related to goals / functional outcomes: See above   Remaining deficits: Minimal pain with functional tasks   Education / Equipment: HEP Plan: Patient agrees to discharge.  Patient goals were partially met. Patient is being discharged due to being pleased with the current functional level.  ?????    Jeral Pinch, PT 27-Apr-2017 12:47 PM

## 2017-04-13 ENCOUNTER — Encounter: Payer: Medicare Other | Admitting: Physical Therapy

## 2017-04-14 ENCOUNTER — Ambulatory Visit (HOSPITAL_BASED_OUTPATIENT_CLINIC_OR_DEPARTMENT_OTHER)
Admission: RE | Admit: 2017-04-14 | Discharge: 2017-04-14 | Disposition: A | Discharge status: Home / Self Care | Payer: Medicare Other | Source: Ambulatory Visit | Attending: Sports Medicine | Admitting: Sports Medicine

## 2017-04-14 DIAGNOSIS — K8020 Calculus of gallbladder without cholecystitis without obstruction: Secondary | ICD-10-CM | POA: Insufficient documentation

## 2017-04-14 DIAGNOSIS — R3129 Other microscopic hematuria: Secondary | ICD-10-CM

## 2017-04-14 DIAGNOSIS — N281 Cyst of kidney, acquired: Secondary | ICD-10-CM | POA: Diagnosis not present

## 2017-04-14 MED ORDER — GADOBENATE DIMEGLUMINE 529 MG/ML IV SOLN
15.0000 mL | Freq: Once | INTRAVENOUS | Status: AC | PRN
  Administered 2017-04-14: 14 mL via INTRAVENOUS

## 2017-04-17 ENCOUNTER — Other Ambulatory Visit: Payer: Self-pay | Admitting: Family Medicine

## 2017-04-18 ENCOUNTER — Other Ambulatory Visit: Payer: Self-pay | Admitting: Family Medicine

## 2017-04-19 ENCOUNTER — Other Ambulatory Visit: Payer: Self-pay | Admitting: Family Medicine

## 2017-05-15 ENCOUNTER — Ambulatory Visit (INDEPENDENT_AMBULATORY_CARE_PROVIDER_SITE_OTHER): Payer: Medicare Other | Admitting: Family Medicine

## 2017-05-15 VITALS — Temp 97.9°F | Ht 62.0 in | Wt 163.0 lb

## 2017-05-15 DIAGNOSIS — R42 Dizziness and giddiness: Secondary | ICD-10-CM | POA: Diagnosis not present

## 2017-05-15 DIAGNOSIS — J0130 Acute sphenoidal sinusitis, unspecified: Secondary | ICD-10-CM | POA: Diagnosis not present

## 2017-05-15 MED ORDER — PREDNISONE 20 MG PO TABS: 40.0000 mg | ORAL_TABLET | Freq: Every day | ORAL | 0 refills | Status: DC

## 2017-05-15 MED ORDER — AZITHROMYCIN 250 MG PO TABS: ORAL_TABLET | ORAL | 0 refills | Status: AC

## 2017-05-15 NOTE — Progress Notes (Signed)
   Subjective:    Patient ID: Belinda Owens, female    DOB: 12-Jan-1932, 83 y.o.   MRN: [id number]  HPI  83 yo Is in today complaining of 2 weeks of nasal congestion, cough, postnasal drip and throat clearing. She's also felt dizzy more so this past week getting worse over the last couple of days. She's never had vertigo before. She denies any sore throat today but did have one a few days ago and no ear pain or pressure. No fevers chills or sweats. She said she's not currently taking any medications for her symptoms. She thought maybe it was just allergies initially as she has had some itchy red eyes as well.   Review of Systems     Objective:   Physical Exam  Constitutional: She is oriented to person, place, and time. She appears well-developed and well-nourished.  HENT:  Head: Normocephalic and atraumatic.  Right Ear: External ear normal.  Left Ear: External ear normal.  Nose: Nose normal.  Mouth/Throat: Oropharynx is clear and moist.  TMs and canals are clear.   Eyes: Pupils are equal, round, and reactive to light. Conjunctivae and EOM are normal.  Neck: Neck supple. No thyromegaly present.  Cardiovascular: Normal rate, regular rhythm and normal heart sounds.   Pulmonary/Chest: Effort normal and breath sounds normal. She has no wheezes.  Lymphadenopathy:    She has no cervical adenopathy.  Neurological: She is alert and oriented to person, place, and time.  Skin: Skin is warm and dry.  Psychiatric: She has a normal mood and affect.       Assessment & Plan:  Acute sinusitis - Treat with azithromycin. Make sure drinking plan fluid. If not better in one week to give [place] call back. If dizziness does not resolve them please let me know. We'll also give her 5 days of prednisone to help with the sinus pressure.  Dizziness - if not resolved after sinus infectin is better then let me know.  Consider benign positional vertigo if not improving.

## 2017-05-22 ENCOUNTER — Telehealth: Payer: Self-pay | Admitting: *Deleted

## 2017-05-22 MED ORDER — AMOXICILLIN-POT CLAVULANATE 875-125 MG PO TABS: 1.0000 | ORAL_TABLET | Freq: Two times a day (BID) | ORAL | 0 refills | Status: DC

## 2017-05-22 NOTE — Telephone Encounter (Signed)
Alternative antibiotics and, if she is still experiencing symptoms after 5-7 days on this new medicine, would have her follow up in the office

## 2017-05-22 NOTE — Telephone Encounter (Signed)
Patient was seen last week for sinus infection. She has finished the course or antibiotics and prednisone but she is still having sinus pressure and pain. She will be going out of town soon and will have to travel on a plane. Please advise if you would want to prescribe another round of anitbiotics or if you want her to come in

## 2017-05-23 ENCOUNTER — Other Ambulatory Visit: Payer: Self-pay | Admitting: *Deleted

## 2017-05-23 MED ORDER — AMOXICILLIN-POT CLAVULANATE 875-125 MG PO TABS: 1.0000 | ORAL_TABLET | Freq: Two times a day (BID) | ORAL | 0 refills | Status: DC

## 2017-05-23 NOTE — Telephone Encounter (Signed)
Patient notified and resent antibiotics to [facility]

## 2017-05-23 NOTE — Progress Notes (Signed)
Resent medication to [facility]

## 2017-06-12 ENCOUNTER — Other Ambulatory Visit: Payer: Self-pay | Admitting: Family Medicine

## 2017-06-13 ENCOUNTER — Other Ambulatory Visit: Payer: Self-pay | Admitting: Family Medicine

## 2017-06-13 DIAGNOSIS — Z9189 Other specified personal risk factors, not elsewhere classified: Secondary | ICD-10-CM | POA: Diagnosis not present

## 2017-06-13 DIAGNOSIS — Z6831 Body mass index (BMI) 31.0-31.9, adult: Secondary | ICD-10-CM | POA: Diagnosis not present

## 2017-06-13 DIAGNOSIS — E661 Drug-induced obesity: Secondary | ICD-10-CM | POA: Diagnosis not present

## 2017-06-13 DIAGNOSIS — N6092 Unspecified benign mammary dysplasia of left breast: Secondary | ICD-10-CM | POA: Diagnosis not present

## 2017-06-26 ENCOUNTER — Ambulatory Visit: Payer: Medicare Other | Admitting: Physician Assistant

## 2017-06-26 ENCOUNTER — Other Ambulatory Visit: Payer: Self-pay | Admitting: *Deleted

## 2017-06-26 MED ORDER — LOSARTAN POTASSIUM-HCTZ 100-25 MG PO TABS: 1.0000 | ORAL_TABLET | Freq: Every day | ORAL | 2 refills | Status: DC

## 2017-07-09 ENCOUNTER — Ambulatory Visit: Payer: Medicare Other | Admitting: Family Medicine

## 2017-07-16 ENCOUNTER — Encounter: Payer: Self-pay | Admitting: Family Medicine

## 2017-07-16 ENCOUNTER — Ambulatory Visit (INDEPENDENT_AMBULATORY_CARE_PROVIDER_SITE_OTHER): Payer: Medicare Other | Admitting: Family Medicine

## 2017-07-16 VITALS — BP 124/68 | HR 67 | Ht 62.0 in | Wt 162.0 lb

## 2017-07-16 DIAGNOSIS — F5101 Primary insomnia: Secondary | ICD-10-CM

## 2017-07-16 DIAGNOSIS — Z8049 Family history of malignant neoplasm of other genital organs: Secondary | ICD-10-CM | POA: Diagnosis not present

## 2017-07-16 DIAGNOSIS — Z23 Encounter for immunization: Secondary | ICD-10-CM

## 2017-07-16 DIAGNOSIS — F411 Generalized anxiety disorder: Secondary | ICD-10-CM

## 2017-07-16 DIAGNOSIS — I10 Essential (primary) hypertension: Secondary | ICD-10-CM

## 2017-07-16 MED ORDER — ALBUTEROL SULFATE HFA 108 (90 BASE) MCG/ACT IN AERS: 2.0000 | INHALATION_SPRAY | Freq: Four times a day (QID) | RESPIRATORY_TRACT | 0 refills | Status: DC | PRN

## 2017-07-16 NOTE — Progress Notes (Signed)
Subjective:    CC: BP, anxiety   HPI:  Hypertension- Pt denies chest pain, SOB, dizziness, or heart palpitations.  Taking meds as directed w/o problems.  Denies medication side effects.    F/U anxiety - Currently on Zoloft 50 mg.Tolerate medication without any side effects or problems.  F/U insomnia - currently using Ambien 5 mg as needed.Denies any excess sedation or problems.  Patient reports that her mother was diagnosed with uterine cancer in her early 87s. She has herself not experienced any bleeding or pain or problems but would like to be checked. She's requesting referral to GYN for this.  Past medical history, Surgical history, Family history not pertinant except as noted below, Social history, Allergies, and medications have been entered into the medical record, reviewed, and corrections made.   Review of Systems: No fevers, chills, night sweats, weight loss, chest pain, or shortness of breath.   Objective:    General: Well Developed, well nourished, and in no acute distress.  Neuro: Alert and oriented x3, extra-ocular muscles intact, sensation grossly intact.  HEENT: Normocephalic, atraumatic  Skin: Warm and dry, no rashes. Cardiac: Regular rate and rhythm, no murmurs rubs or gallops, no lower extremity edema.  Respiratory: Clear to auscultation bilaterally. Not using accessory muscles, speaking in full sentences.   Impression and Recommendations:    HTN - Well controlled. Continue current regimen. Follow up in  6 months.  Due for CMP and lipid panel.  Anxiety - Stable. Continue with Zoloft. Follow-up in 6 months.  Insomnia - stable. Okay to refill Ambien.  Family  history of uterine cancer-we'll place referral to OB/GYN.She would like to have an exam.` Did explain to her the pelvic exam does not always pick up on uterine cancer. But he think she would feel more reassured.

## 2017-07-19 DIAGNOSIS — I10 Essential (primary) hypertension: Secondary | ICD-10-CM | POA: Diagnosis not present

## 2017-07-19 LAB — COMPLETE METABOLIC PANEL WITH GFR
AG Ratio: 1.9 (calc) (ref 1.0–2.5)
ALBUMIN MSPROF: 4.3 g/dL (ref 3.6–5.1)
ALT: 15 U/L (ref 6–29)
AST: 20 U/L (ref 10–35)
Alkaline phosphatase (APISO): 54 U/L (ref 33–130)
BUN: 18 mg/dL (ref 7–25)
CALCIUM: 9.6 mg/dL (ref 8.6–10.4)
CO2: 25 mmol/L (ref 20–32)
CREATININE: 0.79 mg/dL (ref 0.60–0.88)
Chloride: 103 mmol/L (ref 98–110)
GFR, EST AFRICAN AMERICAN: 81 mL/min/{1.73_m2} (ref >=60)
GFR, EST NON AFRICAN AMERICAN: 70 mL/min/{1.73_m2} (ref >=60)
GLUCOSE: 109 mg/dL — AB (ref 65–99)
Globulin: 2.3 g/dL (calc) (ref 1.9–3.7)
Potassium: 3.6 mmol/L (ref 3.5–5.3)
Sodium: 140 mmol/L (ref 135–146)
TOTAL PROTEIN: 6.6 g/dL (ref 6.1–8.1)
Total Bilirubin: 0.6 mg/dL (ref 0.2–1.2)

## 2017-07-19 LAB — LIPID PANEL W/REFLEX DIRECT LDL
CHOLESTEROL: 182 mg/dL (ref <200)
HDL: 61 mg/dL (ref >50)
LDL CHOLESTEROL (CALC): 94 mg/dL
Non-HDL Cholesterol (Calc): 121 mg/dL (calc) (ref <130)
Total CHOL/HDL Ratio: 3 (calc) (ref <5.0)
Triglycerides: 161 mg/dL — ABNORMAL HIGH (ref <150)

## 2017-07-24 DIAGNOSIS — K589 Irritable bowel syndrome without diarrhea: Secondary | ICD-10-CM | POA: Diagnosis not present

## 2017-07-24 DIAGNOSIS — Z800 Family history of malignant neoplasm of digestive organs: Secondary | ICD-10-CM | POA: Diagnosis not present

## 2017-07-24 DIAGNOSIS — D132 Benign neoplasm of duodenum: Secondary | ICD-10-CM | POA: Diagnosis not present

## 2017-08-21 ENCOUNTER — Other Ambulatory Visit: Payer: Self-pay | Admitting: *Deleted

## 2017-08-21 MED ORDER — ZOLPIDEM TARTRATE 5 MG PO TABS: ORAL_TABLET | ORAL | 1 refills | Status: DC

## 2017-09-17 IMAGING — MR MR ABDOMEN WO/W CM
9 of 17 series · 23 of 48 positions shown · IV contrast (multihance)
Comparison: Ultrasound 03/07/2017

CLINICAL DATA: Chronic hematuria.  Renal cysts on ultrasound

EXAM:
MRI ABDOMEN WITHOUT AND WITH CONTRAST
TECHNIQUE: Multiplanar multisequence MR imaging of the abdomen was performed
both before and after the administration of intravenous contrast.
CONTRAST:  14mL MULTIHANCE GADOBENATE DIMEGLUMINE 529 MG/ML IV SOLN

[Series 3: T2 · coronal · 7.0mm · 1.37mm/px · 1 of 27 slices shown (1 of 2)]
[im 1/27]
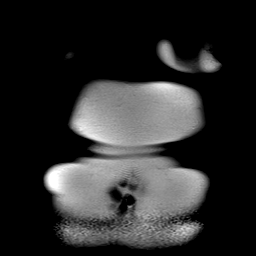

[Series 4: T2 fat-sat · axial · 6.0mm · 1.37mm/px · z∈[-148,+85]mm · 2 of 32 slices shown]
[im 1/32]
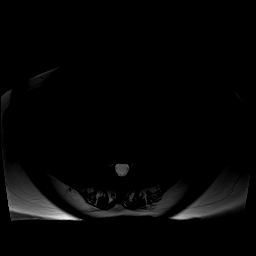
[im 32/32]
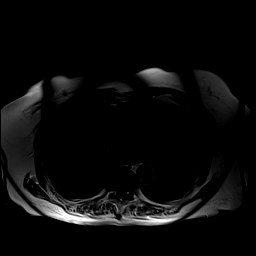

[Series 5: in + out · axial · 6.0mm · 0.68mm/px · z∈[-160,+57]mm · 3 of 60 slices shown]
[im 1/60]
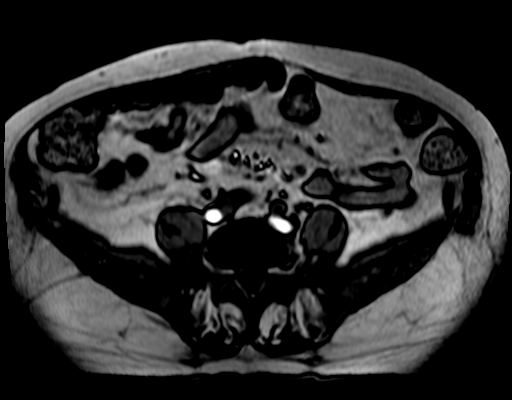
[im 30/60]
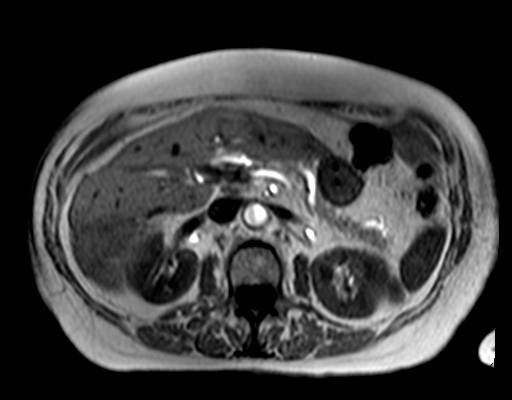
[im 60/60]
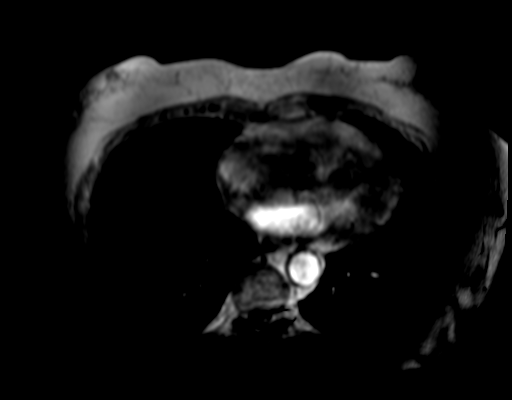

[Series 6: [id number] free breathing · axial · 6.0mm · 1.82mm/px · z∈[-148,+85]mm · 5 of 96 slices shown]
[im 1/96]
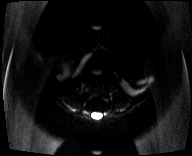
[im 24/96]
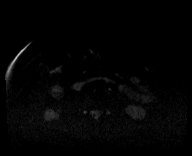
[im 48/96]
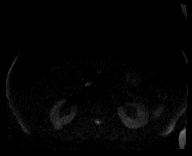
[im 72/96]
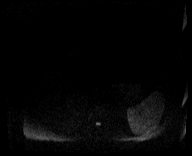
[im 96/96]
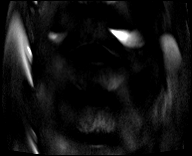

[Series 7: ep2d_[id number] free breathing_adc · axial · 6.0mm · 1.82mm/px · z∈[-148,+85]mm · 2 of 32 slices shown]
[im 1/32]
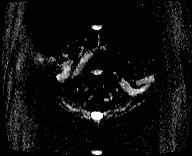
[im 32/32]
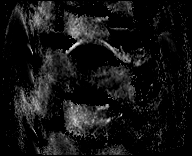

[Series 8: DWI · axial · 6.0mm · 0.68mm/px · z∈[-160,+57]mm · 2 of 30 slices shown]
[im 1/30]
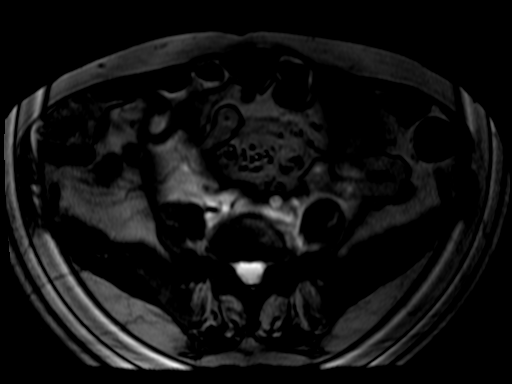
[im 30/30]
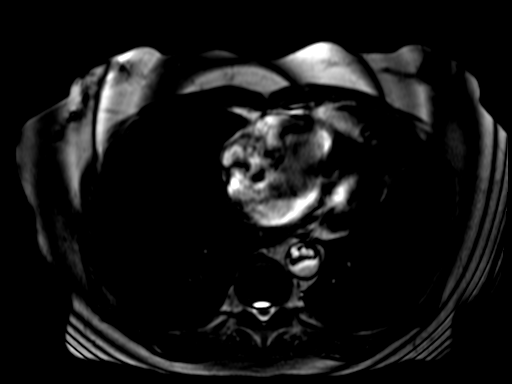

[Series 17: T2 · axial · 6.0mm · 1.37mm/px · z∈[-160,+57]mm · 2 of 30 slices shown (2 of 2)]
[im 1/30]
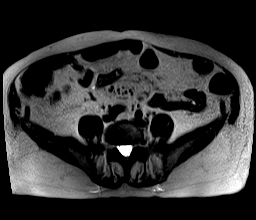
[im 30/30]
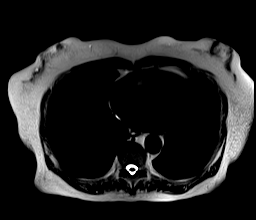

[Series 18: T1 dynamic fat-sat post-contrast · axial · delayed · 4.0mm · 0.68mm/px · z∈[-169,+66]mm · 3 of 60 slices shown]
[im 1/60]
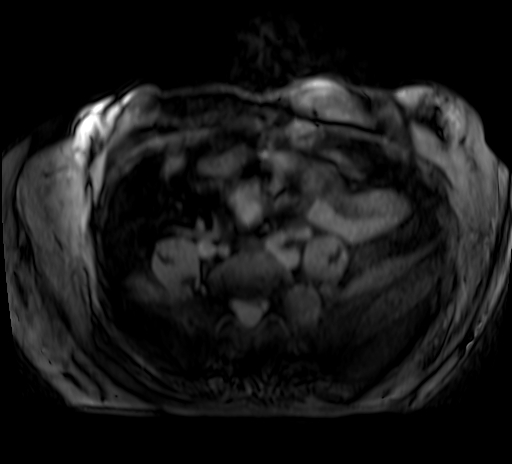
[im 30/60]
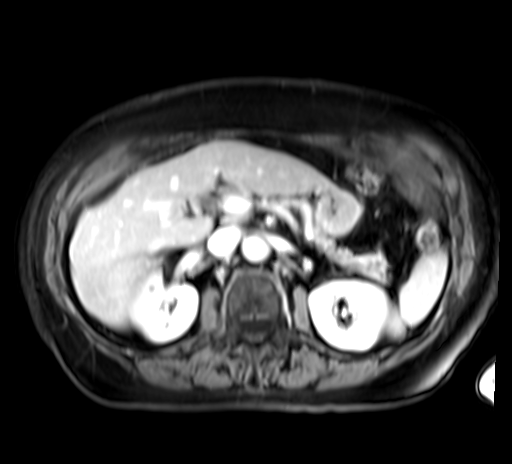
[im 60/60]
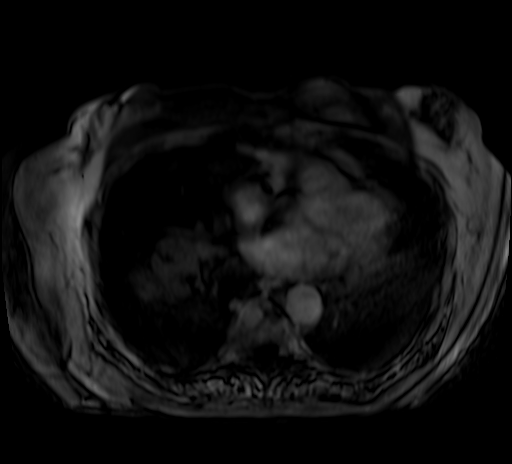

[Series 5003: sub_s18-s9_1 · axial · 4.0mm · 0.68mm/px · z∈[-169,+66]mm · 3 of 60 slices shown]
[im 1/60]
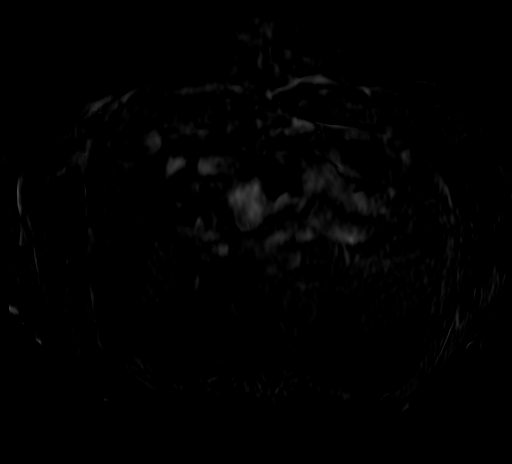
[im 30/60]
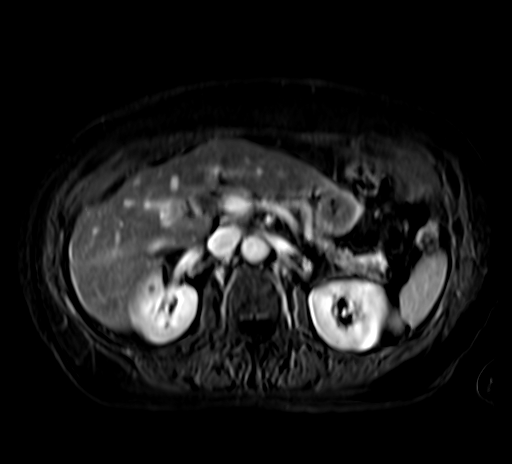
[im 60/60]
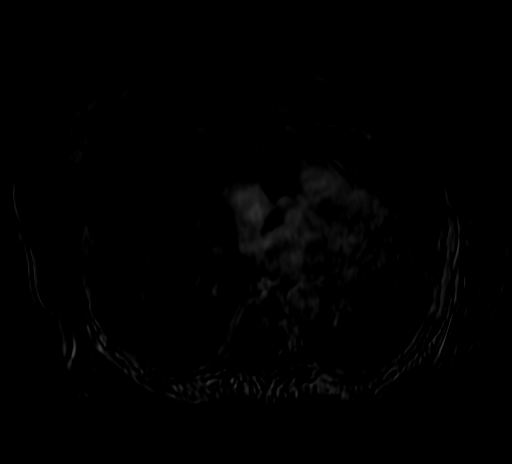

[23 of 48 positions shown; findings below may reference images not displayed]

FINDINGS: Lower chest:  Lung bases are clear.

Hepatobiliary: Normal liver parenchyma. No focal hepatic lesion. No
biliary duct dilatation. Common bile duct normal caliber.

There is multiple sludge and stones layer within the gallbladder. No
gallbladder inflammation.

Pancreas: Normal pancreas.  No duct dilatation

Spleen: Normal spleen.

Adrenals/urinary tract: Adrenal glands are normal. There bilateral
round renal cortical lesions which are high signal intensity T2
weighted imaging. These lesions demonstrate no post-contrast
enhancement consistent with benign cyst (series 12).

Stomach/Bowel: Stomach and limited of the small bowel is
unremarkable

Vascular/Lymphatic: Abdominal aortic normal caliber. No
retroperitoneal periportal lymphadenopathy.

Musculoskeletal: No aggressive osseous lesion
IMPRESSION: 1. Benign bilateral Bosniak 1 renal cysts.
2. Cholelithiasis without cholecystitis.

## 2017-09-18 ENCOUNTER — Encounter: Payer: Self-pay | Admitting: Obstetrics & Gynecology

## 2017-09-18 ENCOUNTER — Other Ambulatory Visit: Payer: Self-pay | Admitting: *Deleted

## 2017-09-18 ENCOUNTER — Ambulatory Visit (INDEPENDENT_AMBULATORY_CARE_PROVIDER_SITE_OTHER): Payer: Medicare Other | Admitting: Obstetrics & Gynecology

## 2017-09-18 VITALS — BP 128/77 | HR 90 | Resp 16 | Ht 63.0 in | Wt 158.0 lb

## 2017-09-18 DIAGNOSIS — Z8049 Family history of malignant neoplasm of other genital organs: Secondary | ICD-10-CM

## 2017-09-18 DIAGNOSIS — Z803 Family history of malignant neoplasm of breast: Secondary | ICD-10-CM

## 2017-09-18 NOTE — Progress Notes (Signed)
   Subjective:    Patient ID: Belinda Owens, female    DOB: 1932-07-25, 83 y.o.   MRN: [id number]  HPI 83 yo divorced White P3 (81 yo deceased, 81 yo son and 6 yo daughter, 3 grands, 3 greats) here today because she recently found out that her mom had a diagnosis of uterine cancer (but this did not cause her death).  On her maternal side, the only FH that she is aware of is breast cancer in her maternal aunt, post menopausal in her 93s or 27.   Review of Systems Abstinent for about 15 years She reports normal bowel and bladder function    Objective:   Physical Exam Breathing, conversing, and ambulating normally Well nourished, well hydrated White female, no apparent distress Black head on mons removed easily Cervix small, looks stenotic Uterus- NSSA, NT, mobile, no adnexal masses      Assessment & Plan:  Family h/o of breast and uterine cancer- Plan for My Risk

## 2017-09-20 ENCOUNTER — Ambulatory Visit (INDEPENDENT_AMBULATORY_CARE_PROVIDER_SITE_OTHER): Payer: Medicare Other | Admitting: Family Medicine

## 2017-09-20 ENCOUNTER — Encounter: Payer: Self-pay | Admitting: Family Medicine

## 2017-09-20 VITALS — BP 140/81 | HR 81 | Temp 98.3°F | Ht 62.0 in | Wt 160.0 lb

## 2017-09-20 DIAGNOSIS — J0110 Acute frontal sinusitis, unspecified: Secondary | ICD-10-CM

## 2017-09-20 MED ORDER — LOSARTAN POTASSIUM-HCTZ 100-25 MG PO TABS: 1.0000 | ORAL_TABLET | Freq: Every day | ORAL | 1 refills | Status: DC

## 2017-09-20 MED ORDER — METOPROLOL SUCCINATE ER 25 MG PO TB24: 25.0000 mg | ORAL_TABLET | Freq: Every day | ORAL | 1 refills | Status: DC

## 2017-09-20 MED ORDER — AZITHROMYCIN 250 MG PO TABS: ORAL_TABLET | ORAL | 0 refills | Status: AC

## 2017-09-20 MED ORDER — AMLODIPINE BESYLATE 5 MG PO TABS: 5.0000 mg | ORAL_TABLET | Freq: Every day | ORAL | 1 refills | Status: DC

## 2017-09-20 NOTE — Patient Instructions (Addendum)
Sinusitis, Adult Sinusitis is soreness and inflammation of your sinuses. Sinuses are hollow spaces in the bones around your face. They are located:  Around your eyes.  In the middle of your forehead.  Behind your nose.  In your cheekbones.  Your sinuses and nasal passages are lined with a stringy fluid (mucus). Mucus normally drains out of your sinuses. When your nasal tissues get inflamed or swollen, the mucus can get trapped or blocked so air cannot flow through your sinuses. This lets bacteria, viruses, and funguses grow, and that leads to infection. Follow these instructions at home: Medicines  Take, use, or apply over-the-counter and prescription medicines only as told by your doctor. These may include nasal sprays.  If you were prescribed an antibiotic medicine, take it as told by your doctor. Do not stop taking the antibiotic even if you start to feel better. Hydrate and Humidify  Drink enough water to keep your pee (urine) clear or pale yellow.  Use a cool mist humidifier to keep the humidity level in your home above 50%.  Breathe in steam for 10-15 minutes, 3-4 times a day or as told by your doctor. You can do this in the bathroom while a hot shower is running.  Try not to spend time in cool or dry air. Rest  Rest as much as possible.  Sleep with your head raised (elevated).  Make sure to get enough sleep each night. General instructions  Put a warm, moist washcloth on your face 3-4 times a day or as told by your doctor. This will help with discomfort.  Wash your hands often with soap and water. If there is no soap and water, use hand sanitizer.  Do not smoke. Avoid being around people who are smoking (secondhand smoke).  Keep all follow-up visits as told by your doctor. This is important. Contact a doctor if:  You have a fever.  Your symptoms get worse.  Your symptoms do not get better within 10 days. Get help right away if:  You have a very bad  headache.  You cannot stop throwing up (vomiting).  You have pain or swelling around your face or eyes.  You have trouble seeing.  You feel confused.  Your neck is stiff.  You have trouble breathing. This information is not intended to replace advice given to you by your health care provider. Make sure you discuss any questions you have with your health care provider. Document Released: 03/27/2008 Document Revised: 06/04/2016 Document Reviewed: 08/04/2015 Elsevier Interactive Patient Education  2018 [place]. Sinus Rinse What is a sinus rinse? A sinus rinse is a simple home treatment that is used to rinse your sinuses with a sterile mixture of salt and water (saline solution). Sinuses are air-filled spaces in your skull behind the bones of your face and forehead that open into your nasal cavity. You will use the following:  Saline solution.  Neti pot or spray bottle. This releases the saline solution into your nose and through your sinuses. Neti pots and spray bottles can be purchased at [employer], a health food store, or online.  When would I do a sinus rinse? A sinus rinse can help to clear mucus, dirt, dust, or pollen from the nasal cavity. You may do a sinus rinse when you have a cold, a virus, nasal allergy symptoms, a sinus infection, or stuffiness in the nose or sinuses. If you are considering a sinus rinse:  Ask your child's health care provider before performing  a sinus rinse on your child.  Do not do a sinus rinse if you have had ear or nasal surgery, ear infection, or blocked ears.  How do I do a sinus rinse?  Wash your hands.  Disinfect your device according to the directions provided and then dry it.  Use the solution that comes with your device or one that is sold separately in stores. Follow the mixing directions on the package.  Fill your device with the amount of saline solution as directed by the device instructions.  Stand over a sink and  tilt your head sideways over the sink.  Place the spout of the device in your upper nostril (the one closer to the ceiling).  Gently pour or squeeze the saline solution into the nasal cavity. The liquid should drain to the lower nostril if you are not overly congested.  Gently blow your nose. Blowing too hard may cause ear pain.  Repeat in the other nostril.  Clean and rinse your device with clean water and then air-dry it. Are there risks of a sinus rinse? Sinus rinse is generally very safe and effective. However, there are a few risks, which include:  A burning sensation in the sinuses. This may happen if you do not make the saline solution as directed. Make sure to follow all directions when making the saline solution.  Infection from contaminated water. This is rare, but possible.  Nasal irritation.  This information is not intended to replace advice given to you by your health care provider. Make sure you discuss any questions you have with your health care provider. Document Released: 05/06/2014 Document Revised: 09/05/2016 Document Reviewed: 02/24/2014 Elsevier Interactive Patient Education  2017 [employer].

## 2017-09-20 NOTE — Progress Notes (Signed)
   Subjective:    Patient ID: Belinda Owens, female    DOB: Mar 20, 1932, 83 y.o.   MRN: [id number]  HPI 83 year old female comes in today complaining of upper respiratory symptoms. She comes of sinus pressure and congestion and dizziness.  No cough.  Taking benadryl and claritin and not really helping.  She says Saturday night she felt extremely dizzy.  She woke up in the midline in the night and says she almost went to the emergency department but called a neighbor to come stay with her for the rest of the night.  She still felt a little dizzy but not as much as she did that night.  She also had some sweats and chills that night thinks that she may have had a fever.  Her pain is frontal bilaterally and over the nasal bridge.  She says sometimes the pain will shoot to the top of her head.   Review of Systems     Objective:   Physical Exam  Constitutional: She is oriented to person, place, and time. She appears well-developed and well-nourished.  HENT:  Head: Normocephalic and atraumatic.  Right Ear: External ear normal.  Left Ear: External ear normal.  Nose: Nose normal.  Mouth/Throat: Oropharynx is clear and moist.  TMs and canals are clear.   Eyes: Conjunctivae and EOM are normal. Pupils are equal, round, and reactive to light.  Neck: Neck supple. No thyromegaly present.  Cardiovascular: Normal rate, regular rhythm and normal heart sounds.  Pulmonary/Chest: Effort normal and breath sounds normal. She has no wheezes.  Lymphadenopathy:    She has no cervical adenopathy.  Neurological: She is alert and oriented to person, place, and time.  Skin: Skin is warm and dry.  Psychiatric: She has a normal mood and affect.        Assessment & Plan:  Acute frontal sinusitis - will tx with the zpack.  Call if not better in one week.  Hydrate well.

## 2017-10-02 ENCOUNTER — Telehealth: Payer: Self-pay | Admitting: *Deleted

## 2017-10-02 NOTE — Telephone Encounter (Signed)
Received notification from [facility] that My Risk has been cancelled due to lack of insurance coverage and pt has not given [facility] permission to proceed.

## 2017-10-04 ENCOUNTER — Telehealth: Payer: Self-pay

## 2017-10-04 MED ORDER — AMOXICILLIN-POT CLAVULANATE 875-125 MG PO TABS: 1.0000 | ORAL_TABLET | Freq: Two times a day (BID) | ORAL | 0 refills | Status: DC

## 2017-10-04 NOTE — Telephone Encounter (Signed)
Belinda Owens called and left a message stating she is not better having taken the Zpack and was advised to call back to get a prescription for Augmentin. Please advise.

## 2017-10-04 NOTE — Telephone Encounter (Signed)
Patient advised.

## 2017-10-04 NOTE — Telephone Encounter (Signed)
Call pt: New prescription sent for Augmentin to [facility].

## 2017-11-13 ENCOUNTER — Other Ambulatory Visit: Payer: Self-pay | Admitting: Family Medicine

## 2017-11-13 ENCOUNTER — Other Ambulatory Visit: Payer: Self-pay | Admitting: *Deleted

## 2017-11-27 DIAGNOSIS — Z1231 Encounter for screening mammogram for malignant neoplasm of breast: Secondary | ICD-10-CM | POA: Diagnosis not present

## 2017-11-27 DIAGNOSIS — Z9189 Other specified personal risk factors, not elsewhere classified: Secondary | ICD-10-CM | POA: Diagnosis not present

## 2017-11-27 DIAGNOSIS — N6092 Unspecified benign mammary dysplasia of left breast: Secondary | ICD-10-CM | POA: Diagnosis not present

## 2017-12-11 DIAGNOSIS — R922 Inconclusive mammogram: Secondary | ICD-10-CM | POA: Diagnosis not present

## 2017-12-11 DIAGNOSIS — N6313 Unspecified lump in the right breast, lower outer quadrant: Secondary | ICD-10-CM | POA: Diagnosis not present

## 2017-12-11 DIAGNOSIS — Z1231 Encounter for screening mammogram for malignant neoplasm of breast: Secondary | ICD-10-CM | POA: Diagnosis not present

## 2017-12-11 DIAGNOSIS — Z9189 Other specified personal risk factors, not elsewhere classified: Secondary | ICD-10-CM | POA: Diagnosis not present

## 2018-01-14 ENCOUNTER — Other Ambulatory Visit: Payer: Self-pay | Admitting: Family Medicine

## 2018-01-14 ENCOUNTER — Ambulatory Visit (INDEPENDENT_AMBULATORY_CARE_PROVIDER_SITE_OTHER): Payer: Medicare Other | Admitting: Family Medicine

## 2018-01-14 ENCOUNTER — Encounter: Payer: Self-pay | Admitting: Family Medicine

## 2018-01-14 VITALS — BP 137/64 | HR 55 | Ht 62.0 in | Wt 160.0 lb

## 2018-01-14 DIAGNOSIS — I10 Essential (primary) hypertension: Secondary | ICD-10-CM

## 2018-01-14 DIAGNOSIS — J309 Allergic rhinitis, unspecified: Secondary | ICD-10-CM

## 2018-01-14 DIAGNOSIS — F5101 Primary insomnia: Secondary | ICD-10-CM

## 2018-01-14 DIAGNOSIS — F411 Generalized anxiety disorder: Secondary | ICD-10-CM

## 2018-01-14 NOTE — Progress Notes (Signed)
Subjective:    CC: 6 mo f/u  HPI:  Hypertension- Pt denies chest pain, SOB, dizziness, or heart palpitations.  Taking meds as directed w/o problems.  Denies medication side effects.    Anxiety -he on Zoloft 50mg  QD tolerating well.  She feels like it is effective in controlling her symptoms.  F/U insomnia -she takes Ambien chronically and feels like she does well with it without any significant side effects or excess daytime sedation.  Also had some nasal congestion symptoms sneezing.  She has been taking some Benadryl and it does provide some temporary relief but she is also been getting a little bit of headache and dizziness.  Usually that some warning sign that it may be turning into a sinus infection.  Past medical history, Surgical history, Family history not pertinant except as noted below, Social history, Allergies, and medications have been entered into the medical record, reviewed, and corrections made.   Review of Systems: No fevers, chills, night sweats, weight loss, chest pain, or shortness of breath.   Objective:    General: Well Developed, well nourished, and in no acute distress.  Neuro: Alert and oriented x3, extra-ocular muscles intact, sensation grossly intact.  HEENT: Normocephalic, atraumatic, TMs and canals are clear bilaterally.  No significant cervical lymphadenopathy. Skin: Warm and dry, no rashes. Cardiac: Regular rate and rhythm, no murmurs rubs or gallops, no lower extremity edema.  Respiratory: Clear to auscultation bilaterally. Not using accessory muscles, speaking in full sentences.   Impression and Recommendations:    HTN - Well controlled. Continue current regimen. Follow up in  37months.  Check BMP.   Insomnia - Well controlled. Continue current regimen. Follow up in  6 months.    Anxiety -7 score of 0.  Doing well.  Follow-up in 6 months. Wants to continue med.   Allergic rhinitis-recommend switching from Benadryl to 24-hour acting Claritin.  If  getting worse and consider treating for acute sinusitis.

## 2018-01-15 ENCOUNTER — Encounter: Payer: Self-pay | Admitting: Family Medicine

## 2018-01-16 ENCOUNTER — Other Ambulatory Visit: Payer: Self-pay | Admitting: *Deleted

## 2018-01-16 ENCOUNTER — Telehealth: Payer: Self-pay | Admitting: Family Medicine

## 2018-01-16 DIAGNOSIS — Z961 Presence of intraocular lens: Secondary | ICD-10-CM | POA: Diagnosis not present

## 2018-01-16 DIAGNOSIS — N281 Cyst of kidney, acquired: Secondary | ICD-10-CM

## 2018-01-16 DIAGNOSIS — H40003 Preglaucoma, unspecified, bilateral: Secondary | ICD-10-CM | POA: Diagnosis not present

## 2018-01-16 DIAGNOSIS — H35373 Puckering of macula, bilateral: Secondary | ICD-10-CM | POA: Diagnosis not present

## 2018-01-16 DIAGNOSIS — H1045 Other chronic allergic conjunctivitis: Secondary | ICD-10-CM | POA: Diagnosis not present

## 2018-01-16 DIAGNOSIS — H02831 Dermatochalasis of right upper eyelid: Secondary | ICD-10-CM | POA: Diagnosis not present

## 2018-01-16 DIAGNOSIS — Z0184 Encounter for antibody response examination: Secondary | ICD-10-CM

## 2018-01-16 DIAGNOSIS — H527 Unspecified disorder of refraction: Secondary | ICD-10-CM | POA: Diagnosis not present

## 2018-01-16 DIAGNOSIS — H43813 Vitreous degeneration, bilateral: Secondary | ICD-10-CM | POA: Diagnosis not present

## 2018-01-16 DIAGNOSIS — H02834 Dermatochalasis of left upper eyelid: Secondary | ICD-10-CM | POA: Diagnosis not present

## 2018-01-16 NOTE — Telephone Encounter (Signed)
Her know that I did speak with infectious disease doctor.  He actually recommended that we consider checking antibodies just to make sure that she has never been exposed.  It may be that even though she has never broken out herself she may have actually been exposed to may have natural immunity in which case she does not need the vaccine.  Is to the kidney cyst.  We should be able to do that this with ultrasound.  Recommend that we do it sometime in May that waits exactly a year from the last one.  If she wants to just call around the time that she wants to go we can put an order in it can usually be done downstairs.

## 2018-01-16 NOTE — Telephone Encounter (Signed)
Pt advised of recommendations. Will place order for labs and [place].Elouise Munroe, [place]

## 2018-01-21 ENCOUNTER — Other Ambulatory Visit: Payer: Medicare Other

## 2018-01-21 ENCOUNTER — Telehealth: Payer: Self-pay

## 2018-01-21 MED ORDER — AMOXICILLIN-POT CLAVULANATE 875-125 MG PO TABS: 1.0000 | ORAL_TABLET | Freq: Two times a day (BID) | ORAL | 0 refills | Status: DC

## 2018-01-21 NOTE — Telephone Encounter (Signed)
Pt advised.

## 2018-01-21 NOTE — Telephone Encounter (Signed)
Prescription for Augmentin sent to [facility].

## 2018-01-21 NOTE — Telephone Encounter (Signed)
Mrs Wawrzyniak called and states she has a sinus infection. She stated Dr Madilyn Fireman knows all about her recurrent problem. She has sinus pressure, headache and dizziness. Denies fever, chills or sweats. She states Dr Madilyn Fireman sends in Augmentin. Please advise.

## 2018-01-25 NOTE — Telephone Encounter (Signed)
Pt called clinic stating since starting the Augmentin her sinus headache is better, but now she is having generalized weakness. Requesting a different antibiotic be sent in. Denies any shortness of breath or visual changes. Denies fever. Pt reports she had been taking her BP at home, no "abnormal readings."  Will route.

## 2018-01-28 MED ORDER — AZITHROMYCIN 250 MG PO TABS: ORAL_TABLET | ORAL | 0 refills | Status: AC

## 2018-01-28 NOTE — Telephone Encounter (Signed)
Left VM with update.  

## 2018-01-28 NOTE — Addendum Note (Signed)
Addended by: Beatrice Lecher D on: 01/28/2018 02:51 PM   Modules accepted: Orders

## 2018-01-28 NOTE — Telephone Encounter (Signed)
Consent for azithromycin to [facility].

## 2018-01-31 DIAGNOSIS — N6092 Unspecified benign mammary dysplasia of left breast: Secondary | ICD-10-CM | POA: Diagnosis not present

## 2018-01-31 DIAGNOSIS — M2550 Pain in unspecified joint: Secondary | ICD-10-CM | POA: Diagnosis not present

## 2018-01-31 DIAGNOSIS — E661 Drug-induced obesity: Secondary | ICD-10-CM | POA: Diagnosis not present

## 2018-01-31 DIAGNOSIS — Z6831 Body mass index (BMI) 31.0-31.9, adult: Secondary | ICD-10-CM | POA: Diagnosis not present

## 2018-01-31 DIAGNOSIS — E663 Overweight: Secondary | ICD-10-CM | POA: Diagnosis not present

## 2018-01-31 DIAGNOSIS — M791 Myalgia, unspecified site: Secondary | ICD-10-CM | POA: Diagnosis not present

## 2018-01-31 DIAGNOSIS — R509 Fever, unspecified: Secondary | ICD-10-CM | POA: Diagnosis not present

## 2018-01-31 DIAGNOSIS — Z9189 Other specified personal risk factors, not elsewhere classified: Secondary | ICD-10-CM | POA: Diagnosis not present

## 2018-02-04 DIAGNOSIS — Z0184 Encounter for antibody response examination: Secondary | ICD-10-CM | POA: Diagnosis not present

## 2018-02-04 DIAGNOSIS — I10 Essential (primary) hypertension: Secondary | ICD-10-CM | POA: Diagnosis not present

## 2018-02-04 LAB — BASIC METABOLIC PANEL WITH GFR
BUN: 22 mg/dL (ref 7–25)
CO2: 28 mmol/L (ref 20–32)
CREATININE: 0.83 mg/dL (ref 0.60–0.88)
Calcium: 9.9 mg/dL (ref 8.6–10.4)
Chloride: 101 mmol/L (ref 98–110)
GFR, EST NON AFRICAN AMERICAN: 65 mL/min/{1.73_m2} (ref >=60)
GFR, Est African American: 76 mL/min/{1.73_m2} (ref >=60)
GLUCOSE: 99 mg/dL (ref 65–99)
Potassium: 4.1 mmol/L (ref 3.5–5.3)
SODIUM: 139 mmol/L (ref 135–146)

## 2018-02-05 DIAGNOSIS — D132 Benign neoplasm of duodenum: Secondary | ICD-10-CM | POA: Diagnosis not present

## 2018-02-05 DIAGNOSIS — K589 Irritable bowel syndrome without diarrhea: Secondary | ICD-10-CM | POA: Diagnosis not present

## 2018-02-05 DIAGNOSIS — Z800 Family history of malignant neoplasm of digestive organs: Secondary | ICD-10-CM | POA: Diagnosis not present

## 2018-02-05 LAB — MEASLES/MUMPS/RUBELLA IMMUNITY
MUMPS IGG: 142 [AU]/ml
Rubella: 1.26 index

## 2018-02-05 NOTE — Progress Notes (Signed)
All labs are normal. 

## 2018-02-13 DIAGNOSIS — J342 Deviated nasal septum: Secondary | ICD-10-CM | POA: Diagnosis not present

## 2018-02-13 DIAGNOSIS — J343 Hypertrophy of nasal turbinates: Secondary | ICD-10-CM | POA: Diagnosis not present

## 2018-02-13 DIAGNOSIS — R6889 Other general symptoms and signs: Secondary | ICD-10-CM | POA: Diagnosis not present

## 2018-02-13 DIAGNOSIS — R51 Headache: Secondary | ICD-10-CM | POA: Diagnosis not present

## 2018-02-19 ENCOUNTER — Encounter: Payer: Self-pay | Admitting: Family Medicine

## 2018-02-19 ENCOUNTER — Ambulatory Visit (INDEPENDENT_AMBULATORY_CARE_PROVIDER_SITE_OTHER): Payer: Medicare Other | Admitting: Family Medicine

## 2018-02-19 VITALS — BP 125/78 | HR 70 | Wt 159.0 lb

## 2018-02-19 DIAGNOSIS — I10 Essential (primary) hypertension: Secondary | ICD-10-CM | POA: Diagnosis not present

## 2018-02-19 DIAGNOSIS — M791 Myalgia, unspecified site: Secondary | ICD-10-CM

## 2018-02-19 DIAGNOSIS — R42 Dizziness and giddiness: Secondary | ICD-10-CM | POA: Diagnosis not present

## 2018-02-19 DIAGNOSIS — T466X5A Adverse effect of antihyperlipidemic and antiarteriosclerotic drugs, initial encounter: Secondary | ICD-10-CM | POA: Diagnosis not present

## 2018-02-19 LAB — COMPLETE METABOLIC PANEL WITH GFR
AG Ratio: 2 (calc) (ref 1.0–2.5)
ALT: 16 U/L (ref 6–29)
AST: 23 U/L (ref 10–35)
Albumin: 4.8 g/dL (ref 3.6–5.1)
Alkaline phosphatase (APISO): 55 U/L (ref 33–130)
BUN/Creatinine Ratio: 23 (calc) — ABNORMAL HIGH (ref 6–22)
BUN: 21 mg/dL (ref 7–25)
CALCIUM: 10.4 mg/dL (ref 8.6–10.4)
CO2: 30 mmol/L (ref 20–32)
CREATININE: 0.92 mg/dL — AB (ref 0.60–0.88)
Chloride: 100 mmol/L (ref 98–110)
GFR, EST NON AFRICAN AMERICAN: 58 mL/min/{1.73_m2} — AB (ref >=60)
GFR, Est African American: 67 mL/min/{1.73_m2} (ref >=60)
GLUCOSE: 95 mg/dL (ref 65–99)
Globulin: 2.4 g/dL (calc) (ref 1.9–3.7)
Potassium: 4.1 mmol/L (ref 3.5–5.3)
Sodium: 139 mmol/L (ref 135–146)
Total Bilirubin: 0.5 mg/dL (ref 0.2–1.2)
Total Protein: 7.2 g/dL (ref 6.1–8.1)

## 2018-02-19 LAB — CBC
HCT: 40.3 % (ref 35.0–45.0)
Hemoglobin: 13.9 g/dL (ref 11.7–15.5)
MCH: 29.4 pg (ref 27.0–33.0)
MCHC: 34.5 g/dL (ref 32.0–36.0)
MCV: 85.4 fL (ref 80.0–100.0)
MPV: 10.1 fL (ref 7.5–12.5)
PLATELETS: 276 10*3/uL (ref 140–400)
RBC: 4.72 10*6/uL (ref 3.80–5.10)
RDW: 13.3 % (ref 11.0–15.0)
WBC: 6.6 10*3/uL (ref 3.8–10.8)

## 2018-02-19 LAB — TSH: TSH: 2.43 mIU/L (ref 0.40–4.50)

## 2018-02-19 NOTE — Progress Notes (Signed)
Belinda Owens is a 84 y.o. female who presents to [place]: [place] today for dizziness.  Ms Critcher developed a short duration episode of significant dizziness.  She describes a head spinning sensation.  This occurred abruptly and lasted about 30 to 60 seconds.  She denies chest pain or palpitations during or preceding the episode.  She denies significant is that she is going to faint sweating nausea or vomiting.  She denies any weakness or numbness and feels back to normal currently.  She denies any significant history of confirmed TIA or CVA.  She denies any new significant med changes aside from discontinuing her atorvastatin recently due to myalgia.   Past Medical History:  Diagnosis Date  . Fibrocystic breast changes of both breasts    Gets yearly mammo and [place]  . History of rheumatic fever    Past Surgical History:  Procedure Laterality Date  . BREAST BIOPSY    . MENISCUS REPAIR Right   . RECONSTRUCTION OF EYELID Bilateral 12/14/2016  . ROTATOR CUFF REPAIR Right    Social History   Tobacco Use  . Smoking status: Former Smoker    Types: Cigarettes    Last attempt to quit: 10/23/1957    Years since quitting: 60.3  . Smokeless tobacco: Never Used  Substance Use Topics  . Alcohol use: Yes    Alcohol/week: 0.0 oz    Comment: very rare   family history includes Asthma in her daughter; Breast cancer in her maternal aunt; Cancer - Other in her brother; Colon cancer in her father; Heart disease in her mother; Uterine cancer in her mother.  ROS as above:  Medications: Current Outpatient Medications  Medication Sig Dispense Refill  . albuterol (PROVENTIL HFA;VENTOLIN HFA) 108 (90 Base) MCG/ACT inhaler Inhale 2 puffs into the lungs every 6 (six) hours as needed for wheezing or shortness of breath. 18 g 0  . amLODipine (NORVASC) 5 MG tablet Take 1 tablet (5 mg total) by mouth  daily. 90 tablet 1  . amoxicillin-clavulanate (AUGMENTIN) 875-125 MG tablet Take 1 tablet by mouth 2 (two) times daily. 20 tablet 0  . aspirin 81 MG tablet Take 81 mg by mouth daily.    Marland Kitchen losartan-hydrochlorothiazide (HYZAAR) 100-25 MG tablet Take 1 tablet by mouth daily. 90 tablet 1  . metoprolol succinate (TOPROL-XL) 25 MG 24 hr tablet Take 1 tablet (25 mg total) by mouth daily. 90 tablet 1  . ranitidine (ZANTAC) 300 MG capsule Take 1 capsule (300 mg total) by mouth every evening. 90 capsule 4  . sertraline (ZOLOFT) 50 MG tablet Take 1 tablet (50 mg total) by mouth daily. 30 tablet 3  . zolpidem (AMBIEN) 5 MG tablet TAKE 1 TABLET BY MOUTH EVERY DAY AT BEDTIME AS NEEDED FOR SLEEP 90 tablet 1   No current facility-administered medications for this visit.    Allergies  Allergen Reactions  . Atenolol Other (See Comments) and Rash    Headache Headache  . Fluoxetine Other (See Comments) and Rash    Tearfulness  . Tylenol With Codeine #3  [Acetaminophen-Codeine] Nausea And Vomiting  . Omeprazole Itching  . Sertraline Other (See Comments)    Fatigue, Palpitations    Health Maintenance Health Maintenance  Topic Date Due  . INFLUENZA VACCINE  05/23/2018  . TETANUS/TDAP  03/23/2024  . DEXA SCAN  Completed  . PNA vac Low Risk Adult  Completed     Exam:  BP 125/78   Pulse  70   Wt 159 lb (72.1 kg)   BMI 29.08 kg/m   Orthostatic VS for the past 24 hrs:  BP- Lying Pulse- Lying BP- Sitting Pulse- Sitting  02/19/18 1458 130/79 66 118/77 67    Gen: Well NAD nontoxic appearing HEENT: EOMI,  MMM Lungs: Normal work of breathing. CTABL Heart: RRR no MRG Abd: NABS, Soft. Nondistended, Nontender Exts: Brisk capillary refill, warm and well perfused.  Neuro: Alert and oriented normal coordination balance and gait reflexes and sensation.  Cranial nerve exam is within normal limits.  Dix-Hallpike test negative bilaterally.  Twelve-lead EKG shows normal sinus rhythm at 65 bpm.  No ST segment  elevation or depression.  EKG not significantly changed from prior EKG in 2017.      Assessment and Plan: 84 y.o. female with  Abrupt dizziness.  Etiology at this time remains unclear.  I think the most likely cause is benign paroxysmal positional vertigo however I cannot reproduce nystagmus or vertigo with Dix-Hallpike test today in clinic.  I think TIA or cardiogenic near syncope is very unlikely.  Patient has an unchanged EKG reassuringly benign vital signs and a normal neurologic exam.  Plan for limited metabolic work-up listed below and watchful waiting.  Recheck with PCP in a few weeks.  If symptoms continue or patient displays other symptoms a TIA work-up would be reasonable.  Statin myalgia.  Intolerant to atorvastatin.  Patient notes improving myalgia off of atorvastatin.  Recommend follow-up with PCP in a few weeks.  Consider pravastatin as a next step.   Orders Placed This Encounter  Procedures  . CBC  . COMPLETE METABOLIC PANEL WITH GFR  . TSH  . EKG 12-Lead   No orders of the defined types were placed in this encounter.    Discussed warning signs or symptoms. Please see discharge instructions. Patient expresses understanding.

## 2018-02-19 NOTE — Patient Instructions (Addendum)
Thank you for coming in today. Get labs soon.  Follow up with Dr Madilyn Fireman in 2-3 weeks.  Return sooner if needed.  We will consider starting pravastatin for cholesterol.  Call or go to the emergency room if you get worse, have trouble breathing, have chest pains, or palpitations.  Get labs today.  Vertigo Vertigo means that you feel like you are moving when you are not. Vertigo can also make you feel like things around you are moving when they are not. This feeling can come and go at any time. Vertigo often goes away on its own. Follow these instructions at home:  Avoid making fast movements.  Avoid driving.  Avoid using heavy machinery.  Avoid doing any task or activity that might cause danger to you or other people if you would have a vertigo attack while you are doing it.  Sit down right away if you feel dizzy or have trouble with your balance.  Take over-the-counter and prescription medicines only as told by your doctor.  Follow instructions from your doctor about which positions or movements you should avoid.  Drink enough fluid to keep your pee (urine) clear or pale yellow.  Keep all follow-up visits as told by your doctor. This is important. Contact a doctor if:  Medicine does not help your vertigo.  You have a fever.  Your problems get worse or you have new symptoms.  Your family or friends see changes in your behavior.  You feel sick to your stomach (nauseous) or you throw up (vomit).  You have a "pins and needles" feeling or you are numb in part of your body. Get help right away if:  You have trouble moving or talking.  You are always dizzy.  You pass out (faint).  You get very bad headaches.  You feel weak or have trouble using your hands, arms, or legs.  You have changes in your hearing.  You have changes in your seeing (vision).  You get a stiff neck.  Bright light starts to bother you. This information is not intended to replace advice given  to you by your health care provider. Make sure you discuss any questions you have with your health care provider. Document Released: 07/18/2008 Document Revised: 03/16/2016 Document Reviewed: 02/01/2015 Elsevier Interactive Patient Education  [employer].

## 2018-03-11 ENCOUNTER — Ambulatory Visit (INDEPENDENT_AMBULATORY_CARE_PROVIDER_SITE_OTHER): Payer: Medicare Other

## 2018-03-11 ENCOUNTER — Encounter: Payer: Self-pay | Admitting: Family Medicine

## 2018-03-11 ENCOUNTER — Ambulatory Visit (INDEPENDENT_AMBULATORY_CARE_PROVIDER_SITE_OTHER): Payer: Medicare Other | Admitting: Family Medicine

## 2018-03-11 VITALS — BP 116/56 | HR 60 | Ht 62.0 in | Wt 159.0 lb

## 2018-03-11 DIAGNOSIS — R519 Headache, unspecified: Secondary | ICD-10-CM

## 2018-03-11 DIAGNOSIS — N281 Cyst of kidney, acquired: Secondary | ICD-10-CM

## 2018-03-11 DIAGNOSIS — R42 Dizziness and giddiness: Secondary | ICD-10-CM

## 2018-03-11 DIAGNOSIS — H532 Diplopia: Secondary | ICD-10-CM | POA: Diagnosis not present

## 2018-03-11 DIAGNOSIS — R51 Headache: Secondary | ICD-10-CM | POA: Diagnosis not present

## 2018-03-11 MED ORDER — MECLIZINE HCL 25 MG PO TABS: 25.0000 mg | ORAL_TABLET | Freq: Three times a day (TID) | ORAL | 0 refills | Status: DC | PRN

## 2018-03-11 NOTE — Progress Notes (Signed)
Subjective:    Patient ID: Belinda Owens, female    DOB: 03-Dec-1931, 84 y.o.   MRN: [id number]  HPI 84 year old female is here today to follow-up for recent same-day acute visit with Dr. Georgina Snell.  Most likely diagnosis was felt to be BPPV though no reproducible nystagmus.  She says she was walking out of [employer] and suddenly felt like everything flipped upside down.  She saw large spots in her vision and then actually fell feeling like she was going to pass out.  She reports that since then she has not seen the spots in her vision or felt like she was going to pass out but just feels like she is "offkilter"  She also reported that she was having some intolerance to her atorvastatin and since stopping it she had felt much better. She feels fine when she is sitting but dizzy when she walks or stands up .    She did have a severe sinus headache this weekend.  She is been having some low-grade 1 almost daily for the last week or 2 but she had a severe one on Sunday.  It feels a little bit better today.  She also noted that she has been falling asleep in her chair at night and waking up with double vision not every night but a couple of nights this is happened over the last couple of weeks as well.     Review of Systems BP (!) 116/56   Pulse 60   Ht 5\' 2"  (1.575 m)   Wt 159 lb (72.1 kg)   SpO2 98%   BMI 29.08 kg/m     Allergies  Allergen Reactions  . Atenolol Other (See Comments) and Rash    Headache Headache  . Fluoxetine Other (See Comments) and Rash    Tearfulness  . Tylenol With Codeine #3  [Acetaminophen-Codeine] Nausea And Vomiting  . Atorvastatin Other (See Comments)    Myalgia  . Omeprazole Itching  . Sertraline Other (See Comments)    Fatigue, Palpitations    Past Medical History:  Diagnosis Date  . Fibrocystic breast changes of both breasts    Gets yearly mammo and [place]  . History of rheumatic fever     Past Surgical History:  Procedure Laterality Date  . BREAST  BIOPSY    . MENISCUS REPAIR Right   . RECONSTRUCTION OF EYELID Bilateral 12/14/2016  . ROTATOR CUFF REPAIR Right     Social History   Socioeconomic History  . Marital status: Divorced    Spouse name: Not on file  . Number of children: 3  . Years of education: some colle  . Highest education level: Not on file  Occupational History  . Occupation: retired.   Social Needs  . Financial resource strain: Not on file  . Food insecurity:    Worry: Not on file    Inability: Not on file  . Transportation needs:    Medical: Not on file    Non-medical: Not on file  Tobacco Use  . Smoking status: Former Smoker    Types: Cigarettes    Last attempt to quit: 10/23/1957    Years since quitting: 60.4  . Smokeless tobacco: Never Used  Substance and Sexual Activity  . Alcohol use: Yes    Alcohol/week: 0.0 oz    Comment: very rare  . Drug use: No  . Sexual activity: Never    Partners: Male    Birth control/protection: None  Lifestyle  . Physical activity:  Days per week: Not on file    Minutes per session: Not on file  . Stress: Not on file  Relationships  . Social connections:    Talks on phone: Not on file    Gets together: Not on file    Attends religious service: Not on file    Active member of club or organization: Not on file    Attends meetings of clubs or organizations: Not on file    Relationship status: Not on file  . Intimate partner violence:    Fear of current or ex partner: Not on file    Emotionally abused: Not on file    Physically abused: Not on file    Forced sexual activity: Not on file  Other Topics Concern  . Not on file  Social History Narrative   Drinks decaf coffee.  Quit smoking 1959.     Family History  Problem Relation Age of Onset  . Breast cancer Maternal Aunt   . Colon cancer Father   . Asthma Daughter   . Heart disease Mother   . Uterine cancer Mother   . Cancer - Other Brother     Outpatient Encounter Medications as of 03/11/2018   Medication Sig  . albuterol (PROVENTIL HFA;VENTOLIN HFA) 108 (90 Base) MCG/ACT inhaler Inhale 2 puffs into the lungs every 6 (six) hours as needed for wheezing or shortness of breath.  Marland Kitchen amLODipine (NORVASC) 5 MG tablet Take 1 tablet (5 mg total) by mouth daily.  Marland Kitchen aspirin 81 MG tablet Take 81 mg by mouth daily.  Marland Kitchen losartan-hydrochlorothiazide (HYZAAR) 100-25 MG tablet Take 1 tablet by mouth daily.  . metoprolol succinate (TOPROL-XL) 25 MG 24 hr tablet Take 1 tablet (25 mg total) by mouth daily.  . ranitidine (ZANTAC) 300 MG capsule Take 1 capsule (300 mg total) by mouth every evening.  . sertraline (ZOLOFT) 50 MG tablet Take 1 tablet (50 mg total) by mouth daily.  Marland Kitchen zolpidem (AMBIEN) 5 MG tablet TAKE 1 TABLET BY MOUTH EVERY DAY AT BEDTIME AS NEEDED FOR SLEEP  . [DISCONTINUED] amoxicillin-clavulanate (AUGMENTIN) 875-125 MG tablet Take 1 tablet by mouth 2 (two) times daily.   No facility-administered encounter medications on file as of 03/11/2018.          Objective:   Physical Exam  Constitutional: She is oriented to person, place, and time. She appears well-developed and well-nourished.  HENT:  Head: Normocephalic and atraumatic.  Right Ear: External ear normal.  Left Ear: External ear normal.  Nose: Nose normal.  Mouth/Throat: Oropharynx is clear and moist.  TM and canals clear.  Right canal is blocked by cerumen.  Eyes: Pupils are equal, round, and reactive to light. Conjunctivae and EOM are normal.  Neck: Neck supple. No thyromegaly present.  Cardiovascular: Normal rate, regular rhythm and normal heart sounds.  No carotid bruit  Pulmonary/Chest: Effort normal and breath sounds normal. She has no wheezes.  Lymphadenopathy:    She has no cervical adenopathy.  Neurological: She is alert and oriented to person, place, and time.  Negative Dix-Hallpike maneuver but unable to fully rotate her neck because of cervical arthritis.  Skin: Skin is warm and dry.  Psychiatric: She has a  normal mood and affect.       Assessment & Plan:  Dizziness -unclear etiology.  She has a negative Dix-Hallpike maneuver but was unable to re-create significant rotation of the neck to get a good test.  But she is describing with head movement causing her dizziness  is most consistent but I am concerned that she is actually feeling a little bit worse.  Also concerned that she is been having some intermittent double vision.  She could try some over-the-counter meclizine if she would like.  Hyperlipidemia-could consider a trial of pravastatin which goes to a slightly different hepatic pathway.  Discussed further once we further investigate her dizziness.

## 2018-03-14 ENCOUNTER — Other Ambulatory Visit: Payer: Self-pay | Admitting: Family Medicine

## 2018-03-16 ENCOUNTER — Ambulatory Visit (HOSPITAL_BASED_OUTPATIENT_CLINIC_OR_DEPARTMENT_OTHER)
Admission: RE | Admit: 2018-03-16 | Discharge: 2018-03-16 | Disposition: A | Discharge status: Home / Self Care | Payer: Medicare Other | Source: Ambulatory Visit | Attending: Family Medicine | Admitting: Family Medicine

## 2018-03-16 DIAGNOSIS — R42 Dizziness and giddiness: Secondary | ICD-10-CM | POA: Diagnosis not present

## 2018-03-16 DIAGNOSIS — H532 Diplopia: Secondary | ICD-10-CM | POA: Diagnosis not present

## 2018-03-16 DIAGNOSIS — R2681 Unsteadiness on feet: Secondary | ICD-10-CM | POA: Diagnosis not present

## 2018-03-16 DIAGNOSIS — R51 Headache: Secondary | ICD-10-CM | POA: Insufficient documentation

## 2018-03-16 DIAGNOSIS — R519 Headache, unspecified: Secondary | ICD-10-CM

## 2018-03-16 MED ORDER — GADOBENATE DIMEGLUMINE 529 MG/ML IV SOLN
15.0000 mL | Freq: Once | INTRAVENOUS | Status: AC | PRN
  Administered 2018-03-16: 14 mL via INTRAVENOUS

## 2018-03-18 MED ORDER — CEFDINIR 300 MG PO CAPS: 300.0000 mg | ORAL_CAPSULE | Freq: Two times a day (BID) | ORAL | 0 refills | Status: DC

## 2018-03-18 NOTE — Addendum Note (Signed)
Addended by: Beatrice Lecher D on: 03/18/2018 04:55 PM   Modules accepted: Orders

## 2018-03-19 ENCOUNTER — Ambulatory Visit: Payer: Medicare Other | Admitting: Family Medicine

## 2018-03-20 DIAGNOSIS — H6123 Impacted cerumen, bilateral: Secondary | ICD-10-CM | POA: Diagnosis not present

## 2018-03-20 DIAGNOSIS — J323 Chronic sphenoidal sinusitis: Secondary | ICD-10-CM | POA: Diagnosis not present

## 2018-04-09 DIAGNOSIS — J328 Other chronic sinusitis: Secondary | ICD-10-CM | POA: Diagnosis not present

## 2018-04-09 DIAGNOSIS — J323 Chronic sphenoidal sinusitis: Secondary | ICD-10-CM | POA: Diagnosis not present

## 2018-04-30 DIAGNOSIS — J323 Chronic sphenoidal sinusitis: Secondary | ICD-10-CM | POA: Diagnosis not present

## 2018-05-15 DIAGNOSIS — M26629 Arthralgia of temporomandibular joint, unspecified side: Secondary | ICD-10-CM | POA: Diagnosis not present

## 2018-05-15 DIAGNOSIS — J323 Chronic sphenoidal sinusitis: Secondary | ICD-10-CM | POA: Diagnosis not present

## 2018-05-28 DIAGNOSIS — N6092 Unspecified benign mammary dysplasia of left breast: Secondary | ICD-10-CM | POA: Diagnosis not present

## 2018-05-28 DIAGNOSIS — R928 Other abnormal and inconclusive findings on diagnostic imaging of breast: Secondary | ICD-10-CM | POA: Diagnosis not present

## 2018-06-18 ENCOUNTER — Other Ambulatory Visit: Payer: Self-pay | Admitting: Family Medicine

## 2018-07-16 DIAGNOSIS — H40003 Preglaucoma, unspecified, bilateral: Secondary | ICD-10-CM | POA: Diagnosis not present

## 2018-07-16 DIAGNOSIS — H1045 Other chronic allergic conjunctivitis: Secondary | ICD-10-CM | POA: Diagnosis not present

## 2018-07-17 ENCOUNTER — Ambulatory Visit (INDEPENDENT_AMBULATORY_CARE_PROVIDER_SITE_OTHER): Payer: Medicare Other | Admitting: Family Medicine

## 2018-07-17 ENCOUNTER — Encounter: Payer: Self-pay | Admitting: Family Medicine

## 2018-07-17 VITALS — BP 133/67 | HR 57 | Ht 62.0 in | Wt 167.0 lb

## 2018-07-17 DIAGNOSIS — I10 Essential (primary) hypertension: Secondary | ICD-10-CM | POA: Diagnosis not present

## 2018-07-17 DIAGNOSIS — E785 Hyperlipidemia, unspecified: Secondary | ICD-10-CM | POA: Diagnosis not present

## 2018-07-17 DIAGNOSIS — Z23 Encounter for immunization: Secondary | ICD-10-CM

## 2018-07-17 DIAGNOSIS — F5101 Primary insomnia: Secondary | ICD-10-CM

## 2018-07-17 DIAGNOSIS — F411 Generalized anxiety disorder: Secondary | ICD-10-CM | POA: Diagnosis not present

## 2018-07-17 MED ORDER — SERTRALINE HCL 50 MG PO TABS: 50.0000 mg | ORAL_TABLET | Freq: Every day | ORAL | 1 refills | Status: DC

## 2018-07-17 MED ORDER — ZOLPIDEM TARTRATE 5 MG PO TABS: ORAL_TABLET | ORAL | 1 refills | Status: DC

## 2018-07-17 NOTE — Progress Notes (Signed)
Subjective:    CC: BP  HPI:  Hypertension- Pt denies chest pain, SOB, dizziness, or heart palpitations.  Taking meds as directed w/o problems.  Denies medication side effects.    F/U insomnia -she is actually been out of her Ambien for about 2 to 3 months.  She is been trying melatonin to see if that would be effective and it really has not helped at all.  She says she has not slept well at all over those 2 or 3 months she is a 91 1 height was a good night of sleep she would like to have a refill on her Ambien.  She tolerates it well without any side effects or excess sedation.  F/U Anxiety -she is actually doing really well on her Zoloft.  She actually takes a half of a tab but does not want to change the prescription to 25 mg she wants to stick with the 50s and then split them.  She did want me to check with her ears today as she tends to get wax accumulation she has been using mineral oil occasionally to try to keep the earwax soft.  She also noted that she is been breathing more heavily especially with activities.  Is been bothering her more over the last month she also has fall allergies with ragweed allergy.  She is been getting watery blurry eyes.  Her sinuses overall have been much better since she had balloon sinus plasty in June.  She says she will get an occasional headache but something she can usually treat with Tylenol.  Has made a very big difference in her quality of life.  Past medical history, Surgical history, Family history not pertinant except as noted below, Social history, Allergies, and medications have been entered into the medical record, reviewed, and corrections made.   Review of Systems: No fevers, chills, night sweats, weight loss, chest pain, or shortness of breath.   Objective:    General: Well Developed, well nourished, and in no acute distress.  Neuro: Alert and oriented x3, extra-ocular muscles intact, sensation grossly intact.  HEENT: Normocephalic,  atraumatic bilateral ear canals are clear TMs are clear as well.  No significant amount of wax or obstruction noted. Skin: Warm and dry, no rashes. Cardiac: Regular rate and rhythm, no murmurs rubs or gallops, no lower extremity edema.  Respiratory: Clear to auscultation bilaterally. Not using accessory muscles, speaking in full sentences.   Impression and Recommendations:    HTN - Well controlled. Continue current regimen. Follow up in  6 months.   Insomnia -did refill Ambien today.  Try to use as needed if possible.  Anxiety -  Overall well controlled. She actually only takes a half a tab, 25 mg but is happy with this regimen.  Refills sent to pharmacy today.  Allergic rhinitis-she is actually been doing great since her balloon and sinus plasty in June last time she had antibiotics for sinus infection was in May and has not had recurrent so hopefully we can get through most of the winter months without another sinus infection.  I did check both ears and she does not have any significant amount of wax.

## 2018-07-18 LAB — LIPID PANEL
Cholesterol: 271 mg/dL — ABNORMAL HIGH (ref <200)
HDL: 62 mg/dL (ref >50)
LDL Cholesterol (Calc): 168 mg/dL (calc) — ABNORMAL HIGH
Non-HDL Cholesterol (Calc): 209 mg/dL (calc) — ABNORMAL HIGH (ref <130)
TRIGLYCERIDES: 242 mg/dL — AB (ref <150)
Total CHOL/HDL Ratio: 4.4 (calc) (ref <5.0)

## 2018-07-18 LAB — BASIC METABOLIC PANEL WITH GFR
BUN: 18 mg/dL (ref 7–25)
CALCIUM: 10.8 mg/dL — AB (ref 8.6–10.4)
CO2: 32 mmol/L (ref 20–32)
CREATININE: 0.82 mg/dL (ref 0.60–0.88)
Chloride: 101 mmol/L (ref 98–110)
GFR, EST AFRICAN AMERICAN: 77 mL/min/{1.73_m2} (ref >=60)
GFR, Est Non African American: 66 mL/min/{1.73_m2} (ref >=60)
Glucose, Bld: 98 mg/dL (ref 65–139)
Potassium: 4.4 mmol/L (ref 3.5–5.3)
Sodium: 141 mmol/L (ref 135–146)

## 2018-07-19 ENCOUNTER — Other Ambulatory Visit: Payer: Self-pay | Admitting: *Deleted

## 2018-07-19 MED ORDER — PRAVASTATIN SODIUM 20 MG PO TABS: 20.0000 mg | ORAL_TABLET | Freq: Every day | ORAL | 3 refills | Status: DC

## 2018-07-19 NOTE — Addendum Note (Signed)
Addended by: Beatrice Lecher D on: 07/19/2018 10:03 AM   Modules accepted: Orders

## 2018-08-09 DIAGNOSIS — N6092 Unspecified benign mammary dysplasia of left breast: Secondary | ICD-10-CM | POA: Diagnosis not present

## 2018-08-09 DIAGNOSIS — E661 Drug-induced obesity: Secondary | ICD-10-CM | POA: Diagnosis not present

## 2018-08-09 DIAGNOSIS — Z9189 Other specified personal risk factors, not elsewhere classified: Secondary | ICD-10-CM | POA: Diagnosis not present

## 2018-08-09 DIAGNOSIS — Z6831 Body mass index (BMI) 31.0-31.9, adult: Secondary | ICD-10-CM | POA: Diagnosis not present

## 2018-08-12 DIAGNOSIS — Z800 Family history of malignant neoplasm of digestive organs: Secondary | ICD-10-CM | POA: Diagnosis not present

## 2018-08-12 DIAGNOSIS — D132 Benign neoplasm of duodenum: Secondary | ICD-10-CM | POA: Diagnosis not present

## 2018-08-12 DIAGNOSIS — R1084 Generalized abdominal pain: Secondary | ICD-10-CM | POA: Diagnosis not present

## 2018-08-15 LAB — BASIC METABOLIC PANEL WITH GFR
BUN/Creatinine Ratio: 25 (calc) — ABNORMAL HIGH (ref 6–22)
BUN: 24 mg/dL (ref 7–25)
CO2: 30 mmol/L (ref 20–32)
CREATININE: 0.97 mg/dL — AB (ref 0.60–0.88)
Calcium: 10 mg/dL (ref 8.6–10.4)
Chloride: 98 mmol/L (ref 98–110)
GFR, EST AFRICAN AMERICAN: 63 mL/min/{1.73_m2} (ref >=60)
GFR, Est Non African American: 54 mL/min/{1.73_m2} — ABNORMAL LOW (ref >=60)
GLUCOSE: 100 mg/dL — AB (ref 65–99)
Potassium: 4 mmol/L (ref 3.5–5.3)
SODIUM: 139 mmol/L (ref 135–146)

## 2018-08-18 ENCOUNTER — Encounter: Payer: Self-pay | Admitting: Family Medicine

## 2018-08-18 DIAGNOSIS — N182 Chronic kidney disease, stage 2 (mild): Secondary | ICD-10-CM | POA: Insufficient documentation

## 2018-10-02 ENCOUNTER — Encounter: Payer: Self-pay | Admitting: Sports Medicine

## 2018-10-02 ENCOUNTER — Ambulatory Visit (INDEPENDENT_AMBULATORY_CARE_PROVIDER_SITE_OTHER): Payer: Medicare Other

## 2018-10-02 ENCOUNTER — Ambulatory Visit (INDEPENDENT_AMBULATORY_CARE_PROVIDER_SITE_OTHER): Payer: Medicare Other | Admitting: Sports Medicine

## 2018-10-02 DIAGNOSIS — M654 Radial styloid tenosynovitis [de Quervain]: Secondary | ICD-10-CM | POA: Diagnosis not present

## 2018-10-02 DIAGNOSIS — M25531 Pain in right wrist: Secondary | ICD-10-CM | POA: Diagnosis not present

## 2018-10-02 DIAGNOSIS — M19031 Primary osteoarthritis, right wrist: Secondary | ICD-10-CM | POA: Diagnosis not present

## 2018-10-02 NOTE — Progress Notes (Signed)
Subjective:    CC: Right wrist pain  HPI: This is a pleasant 84 year old female, decades ago she had a fracture of her right wrist, unsure of what bone.  Overall she did well, she had intermittent swelling.  More recently she is had more severe pain over the dorsum of the radiocarpal joint as well as over the radial styloid process, worse with most movements.  Oral analgesics are not effective, pain is moderate, persistent with radiation up the forearm and into the dorsal hand.  I reviewed the past medical history, family history, social history, surgical history, and allergies today and no changes were needed.  Please see the problem list section below in epic for further details.  Past Medical History: Past Medical History:  Diagnosis Date  . Fibrocystic breast changes of both breasts    Gets yearly mammo and [place]  . History of rheumatic fever    Past Surgical History: Past Surgical History:  Procedure Laterality Date  . BREAST BIOPSY    . MENISCUS REPAIR Right   . RECONSTRUCTION OF EYELID Bilateral 12/14/2016  . ROTATOR CUFF REPAIR Right    Social History: Social History   Socioeconomic History  . Marital status: Divorced    Spouse name: Not on file  . Number of children: 3  . Years of education: some colle  . Highest education level: Not on file  Occupational History  . Occupation: retired.   Social Needs  . Financial resource strain: Not on file  . Food insecurity:    Worry: Not on file    Inability: Not on file  . Transportation needs:    Medical: Not on file    Non-medical: Not on file  Tobacco Use  . Smoking status: Former Smoker    Types: Cigarettes    Last attempt to quit: 10/23/1957    Years since quitting: 60.9  . Smokeless tobacco: Never Used  Substance and Sexual Activity  . Alcohol use: Yes    Alcohol/week: 0.0 standard drinks    Comment: very rare  . Drug use: No  . Sexual activity: Never    Partners: Male    Birth control/protection: None    Lifestyle  . Physical activity:    Days per week: Not on file    Minutes per session: Not on file  . Stress: Not on file  Relationships  . Social connections:    Talks on phone: Not on file    Gets together: Not on file    Attends religious service: Not on file    Active member of club or organization: Not on file    Attends meetings of clubs or organizations: Not on file    Relationship status: Not on file  Other Topics Concern  . Not on file  Social History Narrative   Drinks decaf coffee.  Quit smoking 1959.    Family History: Family History  Problem Relation Age of Onset  . Breast cancer Maternal Aunt   . Colon cancer Father   . Asthma Daughter   . Heart disease Mother   . Uterine cancer Mother   . Cancer - Other Brother    Allergies: Allergies  Allergen Reactions  . Atenolol Other (See Comments) and Rash    Headache Headache  . Fluoxetine Other (See Comments) and Rash    Tearfulness  . Tylenol With Codeine #3  [Acetaminophen-Codeine] Nausea And Vomiting  . Atorvastatin Other (See Comments)    Myalgia  . Omeprazole Itching  . Sertraline Other (See  Comments)    Fatigue, Palpitations   Medications: See med rec.  Review of Systems: No fevers, chills, night sweats, weight loss, chest pain, or shortness of breath.   Objective:    General: Well Developed, well nourished, and in no acute distress.  Neuro: Alert and oriented x3, extra-ocular muscles intact, sensation grossly intact.  HEENT: Normocephalic, atraumatic, pupils equal round reactive to light, neck supple, no masses, no lymphadenopathy, thyroid nonpalpable.  Skin: Warm and dry, no rashes. Cardiac: Regular rate and rhythm, no murmurs rubs or gallops, no lower extremity edema.  Respiratory: Clear to auscultation bilaterally. Not using accessory muscles, speaking in full sentences. Right wrist: Inspection normal with no visible erythema or swelling. ROM smooth and normal with good flexion and extension  and ulnar/radial deviation that is symmetrical with opposite wrist. Tender to palpation over the dorsal radiocarpal joint with reproduction of pain with extension of the wrist. No snuffbox tenderness. No tenderness over Canal of [place]. Strength 5/5 in all directions without pain. Negative tinel's and phalens signs. Tender palpation over the first extensor compartment with a positive Finkelstein sign Negative Watson's test.  Procedure: Real-time Ultrasound Guided Injection of right radiocarpal joint Device: GE Logiq E  Verbal informed consent obtained.  Time-out conducted.  Noted no overlying erythema, induration, or other signs of local infection.  Skin prepped in a sterile fashion.  Local anesthesia: Topical Ethyl chloride.  With sterile technique and under real time ultrasound guidance: 1 cc Kenalog 40, 1 cc lidocaine, 1 cc bupivacaine injected easily. Completed without difficulty  Pain immediately resolved suggesting accurate placement of the medication.  Advised to call if fevers/chills, erythema, induration, drainage, or persistent bleeding.  Images permanently stored and available for review in the ultrasound unit.  Impression: Technically successful ultrasound guided injection.  Procedure: Real-time Ultrasound Guided Injection of right first extensor compartment between the abductor pollicis longus and extensor pollicis brevis.   Device: GE Logiq E  Verbal informed consent obtained.  Time-out conducted.  Noted no overlying erythema, induration, or other signs of local infection.  Skin prepped in a sterile fashion.  Local anesthesia: Topical Ethyl chloride.  With sterile technique and under real time ultrasound guidance: 1 cc Kenalog 40, 1 cc lidocaine, 1 cc bupivacaine injected easily. Completed without difficulty  Pain immediately resolved suggesting accurate placement of the medication.  Advised to call if fevers/chills, erythema, induration, drainage, or persistent  bleeding.  Images permanently stored and available for review in the ultrasound unit.  Impression: Technically successful ultrasound guided injection.  Impression and Recommendations:    Primary osteoarthritis of right wrist Likely some posttraumatic osteoarthritis from a distant fracture. Radiocarpal joint injection today, x-rays. Return to see me in a month.  De Quervain's tenosynovitis, right First extensor compartment injection. Rehab exercises given, x-rays. Return in 1 month. ___________________________________________ Gwen Her. Dianah Field, M.D., ABFM., CAQSM. Primary Care and Sports Medicine [facility]  Adjunct Professor of [employer] [facility] [employer]

## 2018-10-02 NOTE — Assessment & Plan Note (Signed)
First extensor compartment injection. Rehab exercises given, x-rays. Return in 1 month.

## 2018-10-02 NOTE — Assessment & Plan Note (Signed)
Likely some posttraumatic osteoarthritis from a distant fracture. Radiocarpal joint injection today, x-rays. Return to see me in a month.

## 2018-10-25 ENCOUNTER — Ambulatory Visit (INDEPENDENT_AMBULATORY_CARE_PROVIDER_SITE_OTHER): Payer: Medicare Other | Admitting: Family Medicine

## 2018-10-25 ENCOUNTER — Encounter: Payer: Self-pay | Admitting: Family Medicine

## 2018-10-25 VITALS — BP 136/66 | HR 56 | Ht 62.0 in | Wt 162.0 lb

## 2018-10-25 DIAGNOSIS — J0130 Acute sphenoidal sinusitis, unspecified: Secondary | ICD-10-CM

## 2018-10-25 MED ORDER — FLUTICASONE PROPIONATE 50 MCG/ACT NA SUSP: 1.0000 | Freq: Every day | NASAL | 6 refills | Status: DC

## 2018-10-25 MED ORDER — CEFDINIR 300 MG PO CAPS: 300.0000 mg | ORAL_CAPSULE | Freq: Two times a day (BID) | ORAL | 0 refills | Status: DC

## 2018-10-25 NOTE — Progress Notes (Signed)
   Subjective:    Patient ID: Belinda Owens, female    DOB: January 22, 1932, 85 y.o.   MRN: [id number]  HPI 85 year old female comes in today complaining of sinus symptoms.  For couple of weeks she has had a lot of excess drainage in her throat and some irritation.  But over the last 4 days or so she has had significant nasal congestion she is a lot of pressure over the nasal bridge and over the maxillary sinuses bilaterally.  She has been taking Benadryl using Tylenol.  She is been doing her nasal saline rinse as well as the saline spray.  She has not been using her Flonase as she ran out.  She says she is actually done really well since she had the nasal procedure about 6 months ago this the first time she feels like she is had a new infection.  No fevers chills or sweats.   Review of Systems     Objective:   Physical Exam Constitutional:      Appearance: She is well-developed.  HENT:     Head: Normocephalic and atraumatic.     Right Ear: External ear normal.     Left Ear: External ear normal.     Nose: Nose normal.  Eyes:     Conjunctiva/sclera: Conjunctivae normal.     Pupils: Pupils are equal, round, and reactive to light.  Neck:     Musculoskeletal: Neck supple.     Thyroid: No thyromegaly.  Cardiovascular:     Rate and Rhythm: Normal rate and regular rhythm.     Heart sounds: Normal heart sounds.  Pulmonary:     Effort: Pulmonary effort is normal.     Breath sounds: Normal breath sounds. No wheezing.  Lymphadenopathy:     Cervical: No cervical adenopathy.  Skin:    General: Skin is warm and dry.  Neurological:     Mental Status: She is alert and oriented to person, place, and time.        Assessment & Plan:  Acute sinusitis-we will have her go ahead and restart her Flonase and continue with the Tylenol and nasal saline spray as well as treat with Omnicef.  If not better in 1 week.

## 2018-10-25 NOTE — Patient Instructions (Signed)

## 2018-11-01 DIAGNOSIS — K588 Other irritable bowel syndrome: Secondary | ICD-10-CM | POA: Diagnosis not present

## 2018-11-01 DIAGNOSIS — K219 Gastro-esophageal reflux disease without esophagitis: Secondary | ICD-10-CM | POA: Diagnosis not present

## 2018-11-07 ENCOUNTER — Telehealth: Payer: Self-pay

## 2018-11-07 MED ORDER — DOXYCYCLINE HYCLATE 100 MG PO TABS: 100.0000 mg | ORAL_TABLET | Freq: Two times a day (BID) | ORAL | 0 refills | Status: DC

## 2018-11-07 NOTE — Telephone Encounter (Signed)
OK, med pended.  Not sure pharmacy.

## 2018-11-07 NOTE — Telephone Encounter (Signed)
Medication sent. Patient is aware.

## 2018-11-07 NOTE — Telephone Encounter (Signed)
Belinda Owens called and states she still having facial pain, runny nose and headaches. Denies fever, chills or sweats. She would like another antibiotic. Please advise.

## 2018-11-07 NOTE — Telephone Encounter (Signed)
Left message for a call back.

## 2018-11-13 ENCOUNTER — Ambulatory Visit (INDEPENDENT_AMBULATORY_CARE_PROVIDER_SITE_OTHER): Payer: Medicare Other | Admitting: Sports Medicine

## 2018-11-13 ENCOUNTER — Encounter: Payer: Self-pay | Admitting: Sports Medicine

## 2018-11-13 DIAGNOSIS — M19031 Primary osteoarthritis, right wrist: Secondary | ICD-10-CM | POA: Diagnosis not present

## 2018-11-13 DIAGNOSIS — M654 Radial styloid tenosynovitis [de Quervain]: Secondary | ICD-10-CM

## 2018-11-13 NOTE — Assessment & Plan Note (Signed)
Completely resolved after injection at the last visit.

## 2018-11-13 NOTE — Assessment & Plan Note (Signed)
Posttraumatic osteoarthritis from a distant fracture. Now completely resolved after radiocarpal joint injection at the last visit.

## 2018-11-13 NOTE — Progress Notes (Signed)
Subjective:    CC: Follow-up  HPI: Belinda Owens returns, she had 2 pain generators in her wrist, the radiocarpal joint and the de Quervain's tendons.  Both of these structures were injected under ultrasound guidance at the last visit and she returns pain-free.  I reviewed the past medical history, family history, social history, surgical history, and allergies today and no changes were needed.  Please see the problem list section below in epic for further details.  Past Medical History: Past Medical History:  Diagnosis Date  . Fibrocystic breast changes of both breasts    Gets yearly mammo and [place]  . History of rheumatic fever    Past Surgical History: Past Surgical History:  Procedure Laterality Date  . BREAST BIOPSY    . MENISCUS REPAIR Right   . RECONSTRUCTION OF EYELID Bilateral 12/14/2016  . ROTATOR CUFF REPAIR Right    Social History: Social History   Socioeconomic History  . Marital status: Divorced    Spouse name: Not on file  . Number of children: 3  . Years of education: some colle  . Highest education level: Not on file  Occupational History  . Occupation: retired.   Social Needs  . Financial resource strain: Not on file  . Food insecurity:    Worry: Not on file    Inability: Not on file  . Transportation needs:    Medical: Not on file    Non-medical: Not on file  Tobacco Use  . Smoking status: Former Smoker    Types: Cigarettes    Last attempt to quit: 10/23/1957    Years since quitting: 61.0  . Smokeless tobacco: Never Used  Substance and Sexual Activity  . Alcohol use: Yes    Alcohol/week: 0.0 standard drinks    Comment: very rare  . Drug use: No  . Sexual activity: Never    Partners: Male    Birth control/protection: None  Lifestyle  . Physical activity:    Days per week: Not on file    Minutes per session: Not on file  . Stress: Not on file  Relationships  . Social connections:    Talks on phone: Not on file    Gets together: Not on file   Attends religious service: Not on file    Active member of club or organization: Not on file    Attends meetings of clubs or organizations: Not on file    Relationship status: Not on file  Other Topics Concern  . Not on file  Social History Narrative   Drinks decaf coffee.  Quit smoking 1959.    Family History: Family History  Problem Relation Age of Onset  . Breast cancer Maternal Aunt   . Colon cancer Father   . Asthma Daughter   . Heart disease Mother   . Uterine cancer Mother   . Cancer - Other Brother    Allergies: Allergies  Allergen Reactions  . Atenolol Other (See Comments) and Rash    Headache Headache  . Atorvastatin Other (See Comments)    Myalgia  . Fluoxetine Other (See Comments) and Rash    Tearfulness  . Omeprazole Itching  . Sertraline Other (See Comments)    Fatigue, Palpitations  . Tylenol With Codeine #3  [Acetaminophen-Codeine] Nausea And Vomiting   Medications: See med rec.  Review of Systems: No fevers, chills, night sweats, weight loss, chest pain, or shortness of breath.   Objective:    General: Well Developed, well nourished, and in no acute distress.  Neuro: Alert and oriented x3, extra-ocular muscles intact, sensation grossly intact.  HEENT: Normocephalic, atraumatic, pupils equal round reactive to light, neck supple, no masses, no lymphadenopathy, thyroid nonpalpable.  Skin: Warm and dry, no rashes. Cardiac: Regular rate and rhythm, no murmurs rubs or gallops, no lower extremity edema.  Respiratory: Clear to auscultation bilaterally. Not using accessory muscles, speaking in full sentences. Right wrist: Inspection normal with no visible erythema or swelling. ROM smooth and normal with good flexion and extension and ulnar/radial deviation that is symmetrical with opposite wrist. Palpation is normal over metacarpals, navicular, lunate, and TFCC; tendons without tenderness/ swelling No snuffbox tenderness. No tenderness over Canal of  [place]. Strength 5/5 in all directions without pain. Negative tinel's and phalens signs. Negative Finkelstein sign. Negative Watson's test.  Impression and Recommendations:    De Quervain's tenosynovitis, right Completely resolved after injection at the last visit.  Primary osteoarthritis of right wrist Posttraumatic osteoarthritis from a distant fracture. Now completely resolved after radiocarpal joint injection at the last visit. ___________________________________________ Gwen Her. Dianah Field, M.D., ABFM., CAQSM. Primary Care and Sports Medicine [facility]  Adjunct Professor of [employer] [facility] [employer]

## 2018-12-03 DIAGNOSIS — R928 Other abnormal and inconclusive findings on diagnostic imaging of breast: Secondary | ICD-10-CM | POA: Diagnosis not present

## 2018-12-03 DIAGNOSIS — Z9189 Other specified personal risk factors, not elsewhere classified: Secondary | ICD-10-CM | POA: Diagnosis not present

## 2018-12-03 DIAGNOSIS — N6092 Unspecified benign mammary dysplasia of left breast: Secondary | ICD-10-CM | POA: Diagnosis not present

## 2018-12-06 ENCOUNTER — Telehealth: Payer: Self-pay

## 2018-12-06 NOTE — Telephone Encounter (Signed)
Belinda Owens called and left a message stating all of her symptoms finally resolved for 3 days. She then noticed a sinus headache. She has been taking aspirin without relief. Please advise of recommendations.

## 2018-12-10 NOTE — Telephone Encounter (Signed)
Has her BP been better. It was high last time she was her and that could be contributing to her HA esp if her sx have improved. Is she using her Flonase?

## 2018-12-11 NOTE — Telephone Encounter (Signed)
Spoke with Pt, she reports her home BP readings have been "good" and that she is using the Flonase. Does report still having some headaches, but will discuss in further detail at upcoming OV with PCP.

## 2018-12-16 ENCOUNTER — Other Ambulatory Visit: Payer: Self-pay | Admitting: Family Medicine

## 2019-01-13 ENCOUNTER — Other Ambulatory Visit: Payer: Self-pay | Admitting: Family Medicine

## 2019-01-13 DIAGNOSIS — F5101 Primary insomnia: Secondary | ICD-10-CM

## 2019-01-13 NOTE — Telephone Encounter (Signed)
Please have patient schedule a telephonic office visit encounter to be happy to do that anytime this week so that we can follow-up on her blood pressure and sleep medications.  I know she has come in for a couple of acute things recently but we really need to follow her up.  She needs to make sure that she has all of her vitals, current medications, anything she needs refills on, and any specific questions that she has written down before I call her.

## 2019-01-13 NOTE — Telephone Encounter (Signed)
Pt scheduled for telephonic visit on 3/26 @ 320 PM. Was informed of ALL of what she will need to do prior to her appointment time. She voiced understanding and agreed.Belinda Owens, [place], CMA

## 2019-01-15 ENCOUNTER — Ambulatory Visit: Payer: Medicare Other | Admitting: Family Medicine

## 2019-01-16 ENCOUNTER — Ambulatory Visit (INDEPENDENT_AMBULATORY_CARE_PROVIDER_SITE_OTHER): Payer: Medicare Other | Admitting: Family Medicine

## 2019-01-16 ENCOUNTER — Other Ambulatory Visit: Payer: Self-pay

## 2019-01-16 ENCOUNTER — Encounter: Payer: Self-pay | Admitting: Family Medicine

## 2019-01-16 VITALS — BP 125/67 | HR 61 | Ht 62.0 in | Wt 160.0 lb

## 2019-01-16 DIAGNOSIS — J45909 Unspecified asthma, uncomplicated: Secondary | ICD-10-CM

## 2019-01-16 DIAGNOSIS — N182 Chronic kidney disease, stage 2 (mild): Secondary | ICD-10-CM

## 2019-01-16 DIAGNOSIS — F5101 Primary insomnia: Secondary | ICD-10-CM

## 2019-01-16 DIAGNOSIS — I10 Essential (primary) hypertension: Secondary | ICD-10-CM | POA: Diagnosis not present

## 2019-01-16 MED ORDER — ALBUTEROL SULFATE HFA 108 (90 BASE) MCG/ACT IN AERS: 2.0000 | INHALATION_SPRAY | Freq: Four times a day (QID) | RESPIRATORY_TRACT | 1 refills | Status: DC | PRN

## 2019-01-16 NOTE — Progress Notes (Signed)
I asked her about the Albuterol inhaler she reports that she only uses PRN and believes that it is in date?Marland Kitchen.  Pt is doing well. No concerns at this time. She did get her refill on the Ambien.Belinda Owens, [place]

## 2019-01-16 NOTE — Progress Notes (Signed)
Virtual Visit via Telephone Note  I connected with Belinda Owens on 01/16/19 at  3:20 PM EDT by telephone and verified that I am speaking with the correct person using two identifiers.   I discussed the limitations, risks, security and privacy concerns of performing an evaluation and management service by telephone and the availability of in person appointments. I also discussed with the patient that there may be a patient responsible charge related to this service. The patient expressed understanding and agreed to proceed.   Subjective:    CC: f/U insomnia  HPI:  F/U insomnia - she is doing well on the medication. She is on zolpidem 5mg . No side effects.  She uses it every night.    Hypertension- Pt denies chest pain, SOB, dizziness, or heart palpitations.  Taking meds as directed w/o problems.  Denies medication side effects.    F/U renal cysts. Bosniak cyst 1 . She wants to know if needs follow up.    She has a history of allergic bronchitis and just wants to make sure that she has some up-to-date albuterol she checked the data on it and saw that it was expired.  She has not been actively having any symptoms right now though when she took the garbage out a few days ago she felt a little breathy when she came back in the house.  Past medical history, Surgical history, Family history not pertinant except as noted below, Social history, Allergies, and medications have been entered into the medical record, reviewed, and corrections made.   Review of Systems: No fevers, chills, night sweats, weight loss, chest pain, or shortness of breath.   Objective:    General: Eating in complete sentences without any shortness of breath.  Alert and oriented.  Judgment is normal.   Impression and Recommendations:    Insomnia - doing well on medicaitn. No symptoms.   RF have already been sent.   CKD 2- due to recheck renal function.  She will likely go to the lab tomorrow.  HTN. - Well controlled.  Continue current regimen. Follow up in 4 months.  Continue to follow-up to follow BP at home.  Allergic bronchitis-she is actually doing really well right now but wanted to make sure that she had her albuterol inhaler for the spring.  New prescription sent.   I discussed the assessment and treatment plan with the patient. The patient was provided an opportunity to ask questions and all were answered. The patient agreed with the plan and demonstrated an understanding of the instructions.   The patient was advised to call back or seek an in-person evaluation if the symptoms worsen or if the condition fails to improve as anticipated.  I provided 15 minutes of non-face-to-face time during this encounter.   Beatrice Lecher, MD

## 2019-01-17 DIAGNOSIS — I10 Essential (primary) hypertension: Secondary | ICD-10-CM | POA: Diagnosis not present

## 2019-01-18 LAB — BASIC METABOLIC PANEL WITH GFR
BUN: 18 mg/dL (ref 7–25)
CALCIUM: 10.1 mg/dL (ref 8.6–10.4)
CO2: 27 mmol/L (ref 20–32)
Chloride: 100 mmol/L (ref 98–110)
Creat: 0.8 mg/dL (ref 0.60–0.88)
GFR, Est African American: 78 mL/min/{1.73_m2} (ref >=60)
GFR, Est Non African American: 68 mL/min/{1.73_m2} (ref >=60)
GLUCOSE: 93 mg/dL (ref 65–99)
Potassium: 4 mmol/L (ref 3.5–5.3)
Sodium: 138 mmol/L (ref 135–146)

## 2019-01-20 NOTE — Progress Notes (Signed)
All labs are normal. 

## 2019-02-14 DIAGNOSIS — E661 Drug-induced obesity: Secondary | ICD-10-CM | POA: Diagnosis not present

## 2019-02-14 DIAGNOSIS — Z6828 Body mass index (BMI) 28.0-28.9, adult: Secondary | ICD-10-CM | POA: Diagnosis not present

## 2019-02-14 DIAGNOSIS — E663 Overweight: Secondary | ICD-10-CM | POA: Diagnosis not present

## 2019-02-14 DIAGNOSIS — Z723 Lack of physical exercise: Secondary | ICD-10-CM | POA: Diagnosis not present

## 2019-02-14 DIAGNOSIS — Z6831 Body mass index (BMI) 31.0-31.9, adult: Secondary | ICD-10-CM | POA: Diagnosis not present

## 2019-02-14 DIAGNOSIS — Z08 Encounter for follow-up examination after completed treatment for malignant neoplasm: Secondary | ICD-10-CM | POA: Diagnosis not present

## 2019-02-14 DIAGNOSIS — N6092 Unspecified benign mammary dysplasia of left breast: Secondary | ICD-10-CM | POA: Diagnosis not present

## 2019-02-14 DIAGNOSIS — Z9189 Other specified personal risk factors, not elsewhere classified: Secondary | ICD-10-CM | POA: Diagnosis not present

## 2019-02-14 DIAGNOSIS — Z853 Personal history of malignant neoplasm of breast: Secondary | ICD-10-CM | POA: Diagnosis not present

## 2019-04-01 ENCOUNTER — Ambulatory Visit (INDEPENDENT_AMBULATORY_CARE_PROVIDER_SITE_OTHER): Payer: Medicare Other | Admitting: Sports Medicine

## 2019-04-01 ENCOUNTER — Ambulatory Visit (INDEPENDENT_AMBULATORY_CARE_PROVIDER_SITE_OTHER): Payer: Medicare Other

## 2019-04-01 ENCOUNTER — Telehealth: Payer: Self-pay

## 2019-04-01 ENCOUNTER — Other Ambulatory Visit: Payer: Self-pay

## 2019-04-01 DIAGNOSIS — M654 Radial styloid tenosynovitis [de Quervain]: Secondary | ICD-10-CM

## 2019-04-01 DIAGNOSIS — T189XXA Foreign body of alimentary tract, part unspecified, initial encounter: Secondary | ICD-10-CM | POA: Diagnosis not present

## 2019-04-01 NOTE — Assessment & Plan Note (Signed)
Belinda Owens thinks that she swallowed 1 of her false molars on 31 May. She has no abdominal pain. We are going to obtain an abdominal x-ray. She has combed through her stools daily and has not found it. If I do see the tooth then considering no pain we will continue expectant management likely with MiraLAX.

## 2019-04-01 NOTE — Progress Notes (Signed)
Subjective:    CC: Swallowed tooth  HPI: Belinda Owens is a very pleasant 85 year old female, on May 31 she thinks that she accidentally swallowed 1 of her false teeth.  She has combed her stools for the past 9 days and has not found it.  She has no abdominal pain, no nausea, no vomiting, no hematochezia, no melena, and no hematemesis.  In addition she is developed worsening pain on the radial aspect of her right wrist.  She did have a flare of de Quervain's tendinitis on the right approximately 6 months ago that resolved with an injection.  Pain is moderate, persistent, localized without radiation.  I reviewed the past medical history, family history, social history, surgical history, and allergies today and no changes were needed.  Please see the problem list section below in epic for further details.  Past Medical History: Past Medical History:  Diagnosis Date  . Fibrocystic breast changes of both breasts    Gets yearly mammo and [place]  . History of rheumatic fever    Past Surgical History: Past Surgical History:  Procedure Laterality Date  . BREAST BIOPSY    . MENISCUS REPAIR Right   . RECONSTRUCTION OF EYELID Bilateral 12/14/2016  . ROTATOR CUFF REPAIR Right    Social History: Social History   Socioeconomic History  . Marital status: Divorced    Spouse name: Not on file  . Number of children: 3  . Years of education: some colle  . Highest education level: Not on file  Occupational History  . Occupation: retired.   Social Needs  . Financial resource strain: Not on file  . Food insecurity:    Worry: Not on file    Inability: Not on file  . Transportation needs:    Medical: Not on file    Non-medical: Not on file  Tobacco Use  . Smoking status: Former Smoker    Types: Cigarettes    Last attempt to quit: 10/23/1957    Years since quitting: 61.4  . Smokeless tobacco: Never Used  Substance and Sexual Activity  . Alcohol use: Yes    Alcohol/week: 0.0 standard drinks   Comment: very rare  . Drug use: No  . Sexual activity: Never    Partners: Male    Birth control/protection: None  Lifestyle  . Physical activity:    Days per week: Not on file    Minutes per session: Not on file  . Stress: Not on file  Relationships  . Social connections:    Talks on phone: Not on file    Gets together: Not on file    Attends religious service: Not on file    Active member of club or organization: Not on file    Attends meetings of clubs or organizations: Not on file    Relationship status: Not on file  Other Topics Concern  . Not on file  Social History Narrative   Drinks decaf coffee.  Quit smoking 1959.    Family History: Family History  Problem Relation Age of Onset  . Breast cancer Maternal Aunt   . Colon cancer Father   . Asthma Daughter   . Heart disease Mother   . Uterine cancer Mother   . Cancer - Other Brother    Allergies: Allergies  Allergen Reactions  . Atenolol Other (See Comments) and Rash    Headache Headache  . Atorvastatin Other (See Comments)    Myalgia  . Fluoxetine Other (See Comments) and Rash    Tearfulness  .  Omeprazole Itching  . Sertraline Other (See Comments)    Fatigue, Palpitations  . Tylenol With Codeine #3  [Acetaminophen-Codeine] Nausea And Vomiting   Medications: See med rec.  Review of Systems: No fevers, chills, night sweats, weight loss, chest pain, or shortness of breath.   Objective:    General: Well Developed, well nourished, and in no acute distress.  Neuro: Alert and oriented x3, extra-ocular muscles intact, sensation grossly intact.  HEENT: Normocephalic, atraumatic, pupils equal round reactive to light, neck supple, no masses, no lymphadenopathy, thyroid nonpalpable.  Skin: Warm and dry, no rashes. Cardiac: Regular rate and rhythm, no murmurs rubs or gallops, no lower extremity edema.  Respiratory: Clear to auscultation bilaterally. Not using accessory muscles, speaking in full sentences. Abdomen:  Soft, nontender, nondistended.  Normal bowel sounds, no palpable masses, no guarding, rigidity, or rebound tenderness. Right wrist: Inspection normal with no visible erythema or swelling. ROM smooth and normal with good flexion and extension and ulnar/radial deviation that is symmetrical with opposite wrist. Palpation is normal over metacarpals, navicular, lunate, and TFCC; tendons without tenderness/ swelling No snuffbox tenderness. No tenderness over Canal of Gu[place]. Strength 5/5 in all directions without pain. Negative tinel's and phalens signs. Positive Finkelstein sign. Negative Watson's test.  Procedure: Real-time Ultrasound Guided injection of the right first extensor compartment Device: GE Logiq E  Verbal informed consent obtained.  Time-out conducted.  Noted no overlying erythema, induration, or other signs of local infection.  Skin prepped in a sterile fashion.  Local anesthesia: Topical Ethyl chloride.  With sterile technique and under real time ultrasound guidance:  25-gauge needle advanced into the first extensor compartment, I then injected 1 cc Kenalog 40, 1 cc lidocaine, 1 cc bupivacaine. Completed without difficulty  Pain immediately resolved suggesting accurate placement of the medication.  Advised to call if fevers/chills, erythema, induration, drainage, or persistent bleeding.  Images permanently stored and available for review in the ultrasound unit.  Impression: Technically successful ultrasound guided injection.  Impression and Recommendations:    Swallowed foreign body [name] thinks that she swallowed 1 of her false molars on 31 May. She has no abdominal pain. We are going to obtain an abdominal x-ray. She has combed through her stools daily and has not found it. If I do see the tooth then considering no pain we will continue expectant management likely with MiraLAX.   De Quervain's tenosynovitis, right Recurrence of right de Quervain's tenosynovitis, this  was injected 6 months ago with good relief.   ___________________________________________ Gwen Her. Dianah Field, M.D., ABFM., CAQSM. Primary Care and Sports Medicine [facility]  Adjunct Professor of [employer] [facility] [employer]

## 2019-04-01 NOTE — Telephone Encounter (Signed)
Patient scheduled with Dr Dianah Field. She states she swallowed her false tooth.

## 2019-04-01 NOTE — Assessment & Plan Note (Signed)
Recurrence of right de Quervain's tenosynovitis, this was injected 6 months ago with good relief.

## 2019-05-22 ENCOUNTER — Other Ambulatory Visit: Payer: Self-pay

## 2019-06-17 ENCOUNTER — Other Ambulatory Visit: Payer: Self-pay | Admitting: Family Medicine

## 2019-06-17 DIAGNOSIS — F5101 Primary insomnia: Secondary | ICD-10-CM

## 2019-06-17 NOTE — Telephone Encounter (Signed)
OK, please call pt, needs to schedule a 6 month f/u for meds next month in Sept.  Hasn't been seen since March.

## 2019-06-18 NOTE — Telephone Encounter (Signed)
Called patient and LM on VM to call office back and schedule an appointment for September. KG LPN

## 2019-07-15 ENCOUNTER — Ambulatory Visit: Payer: Medicare Other | Admitting: Family Medicine

## 2019-07-15 ENCOUNTER — Encounter: Payer: Self-pay | Admitting: Family Medicine

## 2019-07-15 ENCOUNTER — Other Ambulatory Visit: Payer: Self-pay

## 2019-07-15 ENCOUNTER — Other Ambulatory Visit: Payer: Self-pay | Admitting: Family Medicine

## 2019-07-15 VITALS — BP 126/69 | HR 64 | Ht 62.0 in | Wt 171.0 lb

## 2019-07-15 DIAGNOSIS — Z23 Encounter for immunization: Secondary | ICD-10-CM

## 2019-07-15 NOTE — Progress Notes (Signed)
Agree with documentation as above.   Bowdy Bair, MD  

## 2019-07-15 NOTE — Progress Notes (Signed)
Pt here for flu shot. Afebrile,no recent illness. Vaccination given, pt tolerated well..Maceo Hernan Lynetta, CMA  

## 2019-07-23 NOTE — Progress Notes (Signed)
Subjective:   Belinda Owens is a 85 y.o. female who presents for Medicare Annual (Subsequent) preventive examination.  Review of Systems:  No ROS.  Medicare Wellness Virtual Visit.  Visual/audio telehealth visit, UTA vital signs.   See social history for additional risk factors.    Cardiac Risk Factors include: advanced age (>52men, >69 women);hypertension;dyslipidemia;sedentary lifestyle Sleep patterns:  Getting 6 hours of sleep a night. Wakes up occasionally to go void. Wakes up and feels refreshed and ready for her day.  Home Safety/Smoke Alarms: Feels safe in home. Smoke alarms in place.  Living environment; Lives alone in a 1 story home and no stairs in the home. Shower is a step over tub and grab bars in place. Seat Belt Safety/Bike Helmet: Wears seat belt.   Female:   Pap- Aged out      Mammo- Aged out       Dexa scan-   ordered     CCS- Aged out      Objective:     Vitals: BP 119/68   Pulse 71   Ht 5\' 4"  (1.626 m)   Wt 171 lb (77.6 kg)   BMI 29.35 kg/m   Body mass index is 29.35 kg/m.  Advanced Directives 07/29/2019 03/13/2017 01/04/2017 08/19/2015 03/25/2015  Does Patient Have a Medical Advance Directive? No No No No No  Would patient like information on creating a medical advance directive? No - Patient declined No - Patient declined Yes (ED - Information included in AVS) No - patient declined information No - patient declined information    Tobacco Social History   Tobacco Use  Smoking Status Former Smoker  . Types: Cigarettes  . Quit date: 10/23/1957  . Years since quitting: 61.8  Smokeless Tobacco Never Used     Counseling given: Not Answered   Clinical Intake:                       Past Medical History:  Diagnosis Date  . Fibrocystic breast changes of both breasts    Gets yearly mammo and [place]  . History of rheumatic fever    Past Surgical History:  Procedure Laterality Date  . BREAST BIOPSY    . MENISCUS REPAIR Right   .  RECONSTRUCTION OF EYELID Bilateral 12/14/2016  . ROTATOR CUFF REPAIR Right    Family History  Problem Relation Age of Onset  . Breast cancer Maternal Aunt   . Colon cancer Father   . Asthma Daughter   . Heart disease Mother   . Uterine cancer Mother   . Cancer - Other Brother    Social History   Socioeconomic History  . Marital status: Divorced    Spouse name: Not on file  . Number of children: 3  . Years of education: some colle  . Highest education level: Some college, no degree  Occupational History  . Occupation: retired.     Comment: [employer]  Social Needs  . Financial resource strain: Not hard at all  . Food insecurity    Worry: Never true    Inability: Never true  . Transportation needs    Medical: No    Non-medical: No  Tobacco Use  . Smoking status: Former Smoker    Types: Cigarettes    Quit date: 10/23/1957    Years since quitting: 61.8  . Smokeless tobacco: Never Used  Substance and Sexual Activity  . Alcohol use: Not Currently    Alcohol/week: 0.0 standard drinks  .  Drug use: No  . Sexual activity: Never    Partners: Male    Birth control/protection: None  Lifestyle  . Physical activity    Days per week: 0 days    Minutes per session: 0 min  . Stress: Not at all  Relationships  . Social connections    Talks on phone: More than three times a week    Gets together: Once a week    Attends religious service: More than 4 times per year    Active member of club or organization: No    Attends meetings of clubs or organizations: Never    Relationship status: Divorced  Other Topics Concern  . Not on file  Social History Narrative   Drinks decaf coffee.  Quit smoking 1959.     Outpatient Encounter Medications as of 07/29/2019  Medication Sig  . albuterol (PROVENTIL HFA;VENTOLIN HFA) 108 (90 Base) MCG/ACT inhaler Inhale 2 puffs into the lungs every 6 (six) hours as needed for wheezing or shortness of breath.  Marland Kitchen amLODipine (NORVASC) 5 MG tablet  TAKE ONE TABLET BY MOUTH DAILY  . aspirin 81 MG tablet Take 81 mg by mouth daily.  Marland Kitchen losartan-hydrochlorothiazide (HYZAAR) 100-25 MG tablet TAKE ONE TABLET BY MOUTH DAILY  . metoprolol succinate (TOPROL-XL) 25 MG 24 hr tablet TAKE ONE TABLET BY MOUTH DAILY  . pravastatin (PRAVACHOL) 20 MG tablet TAKE ONE TABLET BY MOUTH DAILY  . sertraline (ZOLOFT) 50 MG tablet TAKE ONE TABLET BY MOUTH DAILY  . zolpidem (AMBIEN) 5 MG tablet TAKE ONE TABLET BY MOUTH EVERY NIGHT AT BEDTIME AS NEEDED FOR SLEEP  . fluticasone (FLONASE) 50 MCG/ACT nasal spray Place 1-2 sprays into both nostrils daily. (Patient not taking: Reported on 07/29/2019)   No facility-administered encounter medications on file as of 07/29/2019.     Activities of Daily Living In your present state of health, do you have any difficulty performing the following activities: 07/29/2019  Hearing? N  Vision? N  Difficulty concentrating or making decisions? N  Walking or climbing stairs? N  Dressing or bathing? N  Doing errands, shopping? N  Preparing Food and eating ? N  Using the Toilet? N  In the past six months, have you accidently leaked urine? N  Do you have problems with loss of bowel control? N  Managing your Medications? N  Managing your Finances? N  Housekeeping or managing your Housekeeping? N  Some recent data might be hidden    Patient Care Team: Hali Marry, MD as PCP - General (Family Medicine)    Assessment:   This is a routine wellness examination for Belinda Owens.Physical assessment deferred to PCP.   Exercise Activities and Dietary recommendations Current Exercise Habits: The patient does not participate in regular exercise at present, Exercise limited by: None identified Diet eats a healthy diet of fruits vegetables and fruits. Drinks milk daily. Breakfast: toast Lunch: sandwich Dinner: Meat and vegetables      Goals    . <enter goal here>     Starting 01/04/17, I will maintain my current lifestyle.      Marland Kitchen Exercise 3x per week (30 min per time)     Would like to start exercising more by walking       Fall Risk Fall Risk  07/29/2019 07/15/2019 01/14/2018 07/16/2017 01/04/2017  Falls in the past year? 0 0 No No Yes  Number falls in past yr: 0 0 - - 1  Comment - - - - Last Easter, pt slipped on  the floor in the bathroom after spraying Lysol and it coated the floor. Pt hit her head on the door frame, pt saw PCP  Injury with Fall? 0 0 - - Yes  Risk for fall due to : - - - - -  Follow up Falls prevention discussed - - - Falls prevention discussed   Is the patient's home free of loose throw rugs in walkways, pet beds, electrical cords, etc?   yes      Grab bars in the bathroom? yes      Handrails on the stairs?   no      Adequate lighting?   yes   Depression Screen PHQ 2/9 Scores 07/29/2019 07/15/2019 01/16/2019 01/14/2018  PHQ - 2 Score 0 0 0 0     Cognitive Function MMSE - Mini Mental State Exam 01/04/2017  Orientation to time 5  Orientation to Place 5  Registration 3  Attention/ Calculation 5  Recall 3  Language- name 2 objects 2  Language- repeat 1  Language- follow 3 step command 3  Language- read & follow direction 1  Write a sentence 1  Copy design 1  Total score 30     6CIT Screen 07/29/2019  What Year? 0 points  What month? 0 points  What time? 0 points  Count back from 20 0 points  Months in reverse 0 points  Repeat phrase 0 points  Total Score 0    Immunization History  Administered Date(s) Administered  . Fluad Quad(high Dose 65+) 07/15/2019  . Influenza, High Dose Seasonal PF 07/16/2017, 07/17/2018  . Influenza,inj,Quad PF,6+ Mos 08/05/2015, 07/06/2016  . Pneumococcal Conjugate-13 08/19/2015  . Pneumococcal Polysaccharide-23 01/04/2017  . Tdap 03/23/2014    Screening Tests Health Maintenance  Topic Date Due  . TETANUS/TDAP  03/23/2024  . INFLUENZA VACCINE  Completed  . DEXA SCAN  Completed  . PNA vac Low Risk Adult  Completed        Plan:    Please  schedule your next medicare wellness visit with me in 1 yr.  Belinda Owens , Thank you for taking time to come for your Medicare Wellness Visit. I appreciate your ongoing commitment to your health goals. Please review the following plan we discussed and let me know if I can assist you in the future.   These are the goals we discussed: Goals    . <enter goal here>     Starting 01/04/17, I will maintain my current lifestyle.     Marland Kitchen Exercise 3x per week (30 min per time)     Would like to start exercising more by walking       This is a list of the screening recommended for you and due dates:  Health Maintenance  Topic Date Due  . Tetanus Vaccine  03/23/2024  . Flu Shot  Completed  . DEXA scan (bone density measurement)  Completed  . Pneumonia vaccines  Completed     I have personally reviewed and noted the following in the patient's chart:   . Medical and social history . Use of alcohol, tobacco or illicit drugs  . Current medications and supplements . Functional ability and status . Nutritional status . Physical activity . Advanced directives . List of other physicians . Hospitalizations, surgeries, and ER visits in previous 12 months . Vitals . Screenings to include cognitive, depression, and falls . Referrals and appointments  In addition, I have reviewed and discussed with patient certain preventive protocols, quality metrics, and best practice  recommendations. A written personalized care plan for preventive services as well as general preventive health recommendations were provided to patient.     Joanne Chars, LPN  [id number]

## 2019-07-28 DIAGNOSIS — Z0000 Encounter for general adult medical examination without abnormal findings: Secondary | ICD-10-CM | POA: Diagnosis not present

## 2019-07-29 ENCOUNTER — Ambulatory Visit (INDEPENDENT_AMBULATORY_CARE_PROVIDER_SITE_OTHER): Payer: Medicare Other | Admitting: *Deleted

## 2019-07-29 VITALS — BP 119/68 | HR 71 | Ht 64.0 in | Wt 171.0 lb

## 2019-07-29 DIAGNOSIS — Z13820 Encounter for screening for osteoporosis: Secondary | ICD-10-CM | POA: Diagnosis not present

## 2019-07-29 DIAGNOSIS — M8580 Other specified disorders of bone density and structure, unspecified site: Secondary | ICD-10-CM | POA: Diagnosis not present

## 2019-07-29 DIAGNOSIS — Z780 Asymptomatic menopausal state: Secondary | ICD-10-CM | POA: Diagnosis not present

## 2019-07-29 DIAGNOSIS — Z0000 Encounter for general adult medical examination without abnormal findings: Secondary | ICD-10-CM | POA: Diagnosis not present

## 2019-07-29 NOTE — Patient Instructions (Signed)
Please schedule your next medicare wellness visit with me in 1 yr.  Belinda Owens , Thank you for taking time to come for your Medicare Wellness Visit. I appreciate your ongoing commitment to your health goals. Please review the following plan we discussed and let me know if I can assist you in the future.  These are the goals we discussed: Goals    . <enter goal here>     Starting 01/04/17, I will maintain my current lifestyle.     Marland Kitchen Exercise 3x per week (30 min per time)     Would like to start exercising more by walking    I ordered your bone density study and [facility] in [place] should be calling you to set this up.

## 2019-08-13 ENCOUNTER — Ambulatory Visit (INDEPENDENT_AMBULATORY_CARE_PROVIDER_SITE_OTHER): Payer: Medicare Other

## 2019-08-13 ENCOUNTER — Other Ambulatory Visit: Payer: Self-pay

## 2019-08-13 DIAGNOSIS — Z13820 Encounter for screening for osteoporosis: Secondary | ICD-10-CM

## 2019-08-13 DIAGNOSIS — Z780 Asymptomatic menopausal state: Secondary | ICD-10-CM

## 2019-08-13 DIAGNOSIS — M8580 Other specified disorders of bone density and structure, unspecified site: Secondary | ICD-10-CM

## 2019-08-13 DIAGNOSIS — M85832 Other specified disorders of bone density and structure, left forearm: Secondary | ICD-10-CM | POA: Diagnosis not present

## 2019-08-13 DIAGNOSIS — Z0000 Encounter for general adult medical examination without abnormal findings: Secondary | ICD-10-CM | POA: Diagnosis not present

## 2019-08-22 DIAGNOSIS — N6092 Unspecified benign mammary dysplasia of left breast: Secondary | ICD-10-CM | POA: Diagnosis not present

## 2019-08-22 DIAGNOSIS — Z6831 Body mass index (BMI) 31.0-31.9, adult: Secondary | ICD-10-CM | POA: Diagnosis not present

## 2019-08-22 DIAGNOSIS — E661 Drug-induced obesity: Secondary | ICD-10-CM | POA: Diagnosis not present

## 2019-08-22 DIAGNOSIS — Z9189 Other specified personal risk factors, not elsewhere classified: Secondary | ICD-10-CM | POA: Diagnosis not present

## 2019-08-22 DIAGNOSIS — Z853 Personal history of malignant neoplasm of breast: Secondary | ICD-10-CM | POA: Diagnosis not present

## 2019-08-22 DIAGNOSIS — Z79899 Other long term (current) drug therapy: Secondary | ICD-10-CM | POA: Diagnosis not present

## 2019-08-29 DIAGNOSIS — R0981 Nasal congestion: Secondary | ICD-10-CM | POA: Diagnosis not present

## 2019-08-29 DIAGNOSIS — D110 Benign neoplasm of parotid gland: Secondary | ICD-10-CM | POA: Diagnosis not present

## 2019-08-29 DIAGNOSIS — K118 Other diseases of salivary glands: Secondary | ICD-10-CM | POA: Diagnosis not present

## 2019-08-29 DIAGNOSIS — J3489 Other specified disorders of nose and nasal sinuses: Secondary | ICD-10-CM | POA: Diagnosis not present

## 2019-09-16 ENCOUNTER — Other Ambulatory Visit: Payer: Self-pay | Admitting: Family Medicine

## 2019-09-16 DIAGNOSIS — F5101 Primary insomnia: Secondary | ICD-10-CM

## 2019-09-16 MED ORDER — ZOLPIDEM TARTRATE 5 MG PO TABS: ORAL_TABLET | ORAL | 0 refills | Status: DC

## 2019-10-31 ENCOUNTER — Telehealth: Payer: Self-pay

## 2019-10-31 NOTE — Telephone Encounter (Signed)
Madeleyn advised if you are in Phase 1b, age 84 and over, you can contact the [facility] Department at [phone] to schedule an appointment for your vaccine.  Phone lines will be open 8 AM to 6:30 PM Monday through Friday, and 8 AM to 1 PM on Saturdays and Sundays.  They will start the vaccine clinic 10/29/2019 per the [facility] Department web site. If you do not live in [facility] I would recommend you go to your county health department web site to find out how to register for the vaccine and where to go.

## 2019-12-01 ENCOUNTER — Ambulatory Visit: Payer: Medicare Other | Admitting: Family Medicine

## 2019-12-05 DIAGNOSIS — N6092 Unspecified benign mammary dysplasia of left breast: Secondary | ICD-10-CM | POA: Diagnosis not present

## 2019-12-05 DIAGNOSIS — Z1231 Encounter for screening mammogram for malignant neoplasm of breast: Secondary | ICD-10-CM | POA: Diagnosis not present

## 2019-12-10 DIAGNOSIS — Z9189 Other specified personal risk factors, not elsewhere classified: Secondary | ICD-10-CM | POA: Diagnosis not present

## 2019-12-10 DIAGNOSIS — R922 Inconclusive mammogram: Secondary | ICD-10-CM | POA: Diagnosis not present

## 2019-12-10 DIAGNOSIS — N6092 Unspecified benign mammary dysplasia of left breast: Secondary | ICD-10-CM | POA: Diagnosis not present

## 2019-12-10 DIAGNOSIS — N6002 Solitary cyst of left breast: Secondary | ICD-10-CM | POA: Diagnosis not present

## 2019-12-17 ENCOUNTER — Other Ambulatory Visit: Payer: Self-pay | Admitting: Family Medicine

## 2019-12-17 DIAGNOSIS — F5101 Primary insomnia: Secondary | ICD-10-CM

## 2019-12-19 ENCOUNTER — Telehealth: Payer: Self-pay | Admitting: Family Medicine

## 2019-12-19 NOTE — Telephone Encounter (Signed)
It was already sent this morning.  Did the pharmacy not receive the prescription?   Disp Refills Start End   zolpidem (AMBIEN) 5 MG tablet 15 tablet 0 12/19/2019    Sig: TAKE ONE TABLET BY MOUTH EVERY NIGHT AT BEDTIME AS NEEDED FOR SLEEP   Sent to pharmacy as: zolpidem (AMBIEN) 5 MG tablet   E-Prescribing Status: Receipt confirmed by pharmacy (12/19/2019 9:26 AM EST)

## 2019-12-19 NOTE — Telephone Encounter (Signed)
Called pharmacy and they do have it and notified pt ready to pick up. KG LPN

## 2019-12-19 NOTE — Telephone Encounter (Signed)
Patient calls and request a refill on her Lorrin Mais to be sent to [facility] in [place].  KG LPN

## 2020-01-01 DIAGNOSIS — Z23 Encounter for immunization: Secondary | ICD-10-CM | POA: Diagnosis not present

## 2020-01-07 ENCOUNTER — Encounter: Payer: Self-pay | Admitting: Family Medicine

## 2020-01-07 ENCOUNTER — Ambulatory Visit (INDEPENDENT_AMBULATORY_CARE_PROVIDER_SITE_OTHER): Payer: Medicare Other | Admitting: Family Medicine

## 2020-01-07 ENCOUNTER — Other Ambulatory Visit: Payer: Self-pay

## 2020-01-07 ENCOUNTER — Other Ambulatory Visit: Payer: Self-pay | Admitting: *Deleted

## 2020-01-07 VITALS — BP 124/63 | HR 57 | Ht 64.0 in | Wt 171.0 lb

## 2020-01-07 DIAGNOSIS — N182 Chronic kidney disease, stage 2 (mild): Secondary | ICD-10-CM

## 2020-01-07 DIAGNOSIS — I10 Essential (primary) hypertension: Secondary | ICD-10-CM

## 2020-01-07 DIAGNOSIS — E7849 Other hyperlipidemia: Secondary | ICD-10-CM

## 2020-01-07 DIAGNOSIS — F5101 Primary insomnia: Secondary | ICD-10-CM

## 2020-01-07 DIAGNOSIS — F411 Generalized anxiety disorder: Secondary | ICD-10-CM | POA: Diagnosis not present

## 2020-01-07 MED ORDER — AMLODIPINE BESYLATE 5 MG PO TABS: 5.0000 mg | ORAL_TABLET | Freq: Every day | ORAL | 1 refills | Status: DC

## 2020-01-07 MED ORDER — METOPROLOL SUCCINATE ER 25 MG PO TB24: 25.0000 mg | ORAL_TABLET | Freq: Every day | ORAL | 1 refills | Status: DC

## 2020-01-07 MED ORDER — LOSARTAN POTASSIUM-HCTZ 100-25 MG PO TABS: 1.0000 | ORAL_TABLET | Freq: Every day | ORAL | 1 refills | Status: DC

## 2020-01-07 MED ORDER — ZOLPIDEM TARTRATE 5 MG PO TABS: ORAL_TABLET | ORAL | 0 refills | Status: DC

## 2020-01-07 MED ORDER — SERTRALINE HCL 50 MG PO TABS: 50.0000 mg | ORAL_TABLET | Freq: Every day | ORAL | 1 refills | Status: DC

## 2020-01-07 NOTE — Assessment & Plan Note (Signed)
She is requesting a refill on her Ambien today.  New prescription sent to pharmacy.  We have sent a temporary prescription back in February until she can get an appointment.

## 2020-01-07 NOTE — Assessment & Plan Note (Signed)
Well controlled. Continue current regimen. Follow up in  6 mo  

## 2020-01-07 NOTE — Progress Notes (Signed)
Established Patient Office Visit  Subjective:  Patient ID: Belinda Owens, female    DOB: 1932-07-20  Age: 86 y.o. MRN: [id number]  CC:  Chief Complaint  Patient presents with  . Hypertension    HPI [name] presents for   Hypertension- Pt denies chest pain, SOB, dizziness, or heart palpitations.  Taking meds as directed w/o problems.  Denies medication side effects.    Follow-up CKD 2-last renal function was almost a year ago.  We need to get that updated.  No recent changes.  Follow-up anxiety-currently on sertraline 50 mg daily.  Happy with her current dosing regimen and feels like it is working well.  She has received her first Covid vaccine.  Follow-up insomnia-she would also like a refill on her Ambien today.  She does well with the medication denies any side effects when she takes it.  Past Medical History:  Diagnosis Date  . Fibrocystic breast changes of both breasts    Gets yearly mammo and [place]  . History of rheumatic fever     Past Surgical History:  Procedure Laterality Date  . BREAST BIOPSY    . MENISCUS REPAIR Right   . RECONSTRUCTION OF EYELID Bilateral 12/14/2016  . ROTATOR CUFF REPAIR Right     Family History  Problem Relation Age of Onset  . Breast cancer Maternal Aunt   . Colon cancer Father   . Asthma Daughter   . Heart disease Mother   . Uterine cancer Mother   . Cancer - Other Brother     Social History   Socioeconomic History  . Marital status: Divorced    Spouse name: Not on file  . Number of children: 3  . Years of education: some colle  . Highest education level: Some college, no degree  Occupational History  . Occupation: retired.     Comment: [employer]  Tobacco Use  . Smoking status: Former Smoker    Types: Cigarettes    Quit date: 10/23/1957    Years since quitting: 62.2  . Smokeless tobacco: Never Used  Substance and Sexual Activity  . Alcohol use: Not Currently    Alcohol/week: 0.0 standard drinks  . Drug use:  No  . Sexual activity: Never    Partners: Male    Birth control/protection: None  Other Topics Concern  . Not on file  Social History Narrative   Drinks decaf coffee.  Quit smoking 1959.    Social Determinants of Health   Financial Resource Strain: Low Risk   . Difficulty of Paying Living Expenses: Not hard at all  Food Insecurity: No Food Insecurity  . Worried About Charity fundraiser in the Last Year: Never true  . Ran Out of Food in the Last Year: Never true  Transportation Needs: No Transportation Needs  . Lack of Transportation (Medical): No  . Lack of Transportation (Non-Medical): No  Physical Activity: Inactive  . Days of Exercise per Week: 0 days  . Minutes of Exercise per Session: 0 min  Stress: No Stress Concern Present  . Feeling of Stress : Not at all  Social Connections: Somewhat Isolated  . Frequency of Communication with Friends and Family: More than three times a week  . Frequency of Social Gatherings with Friends and Family: Once a week  . Attends Religious Services: More than 4 times per year  . Active Member of Clubs or Organizations: No  . Attends Archivist Meetings: Never  . Marital Status: Divorced  Human resources officer  Violence: Not At Risk  . Fear of Current or Ex-Partner: No  . Emotionally Abused: No  . Physically Abused: No  . Sexually Abused: No    Outpatient Medications Prior to Visit  Medication Sig Dispense Refill  . albuterol (PROVENTIL HFA;VENTOLIN HFA) 108 (90 Base) MCG/ACT inhaler Inhale 2 puffs into the lungs every 6 (six) hours as needed for wheezing or shortness of breath. 18 g 1  . aspirin 81 MG tablet Take 81 mg by mouth daily.    . pravastatin (PRAVACHOL) 20 MG tablet TAKE ONE TABLET BY MOUTH DAILY 90 tablet 2  . amLODipine (NORVASC) 5 MG tablet TAKE ONE TABLET BY MOUTH DAILY 30 tablet 0  . losartan-hydrochlorothiazide (HYZAAR) 100-25 MG tablet TAKE ONE TABLET BY MOUTH DAILY 30 tablet 0  . metoprolol succinate (TOPROL-XL) 25  MG 24 hr tablet TAKE ONE TABLET BY MOUTH DAILY 30 tablet 0  . sertraline (ZOLOFT) 50 MG tablet TAKE ONE TABLET BY MOUTH DAILY 90 tablet 0  . zolpidem (AMBIEN) 5 MG tablet TAKE ONE TABLET BY MOUTH EVERY NIGHT AT BEDTIME AS NEEDED FOR SLEEP 15 tablet 0  . fluticasone (FLONASE) 50 MCG/ACT nasal spray Place 1-2 sprays into both nostrils daily. (Patient not taking: Reported on 07/29/2019) 16 g 6   No facility-administered medications prior to visit.    Allergies  Allergen Reactions  . Atenolol Other (See Comments) and Rash    Headache Headache  . Atorvastatin Other (See Comments)    Myalgia  . Fluoxetine Other (See Comments) and Rash    Tearfulness  . Omeprazole Itching  . Tylenol With Codeine #3  [Acetaminophen-Codeine] Nausea And Vomiting    ROS Review of Systems    Objective:    Physical Exam  Constitutional: She is oriented to person, place, and time. She appears well-developed and well-nourished.  HENT:  Head: Normocephalic and atraumatic.  Cardiovascular: Normal rate, regular rhythm and normal heart sounds.  Pulmonary/Chest: Effort normal and breath sounds normal.  Musculoskeletal:        General: No edema.  Neurological: She is alert and oriented to person, place, and time.  Skin: Skin is warm and dry.  Psychiatric: She has a normal mood and affect. Her behavior is normal.    BP 124/63   Pulse (!) 57   Ht 5\' 4"  (1.626 m)   Wt 171 lb (77.6 kg)   SpO2 99%   BMI 29.35 kg/m  Wt Readings from Last 3 Encounters:  01/07/20 171 lb (77.6 kg)  07/29/19 171 lb (77.6 kg)  07/15/19 171 lb (77.6 kg)     There are no preventive care reminders to display for this patient.  There are no preventive care reminders to display for this patient.  Lab Results  Component Value Date   TSH 2.43 02/19/2018   Lab Results  Component Value Date   WBC 6.6 02/19/2018   HGB 13.9 02/19/2018   HCT 40.3 02/19/2018   MCV 85.4 02/19/2018   PLT 276 02/19/2018   Lab Results  Component  Value Date   NA 138 01/17/2019   K 4.0 01/17/2019   CO2 27 01/17/2019   GLUCOSE 93 01/17/2019   BUN 18 01/17/2019   CREATININE 0.80 01/17/2019   BILITOT 0.5 02/19/2018   ALKPHOS 56 07/06/2016   AST 23 02/19/2018   ALT 16 02/19/2018   PROT 7.2 02/19/2018   ALBUMIN 4.3 07/06/2016   CALCIUM 10.1 01/17/2019   Lab Results  Component Value Date   CHOL 271 (H) 07/17/2018  Lab Results  Component Value Date   HDL 62 07/17/2018   Lab Results  Component Value Date   LDLCALC 168 (H) 07/17/2018   Lab Results  Component Value Date   TRIG 242 (H) 07/17/2018   Lab Results  Component Value Date   CHOLHDL 4.4 07/17/2018   No results found for: HGBA1C    Assessment & Plan:   Problem List Items Addressed This Visit      Cardiovascular and Mediastinum   Essential hypertension - Primary    Well controlled. Continue current regimen. Follow up in  6 mo      Relevant Medications   metoprolol succinate (TOPROL-XL) 25 MG 24 hr tablet   losartan-hydrochlorothiazide (HYZAAR) 100-25 MG tablet   amLODipine (NORVASC) 5 MG tablet   Other Relevant Orders   COMPLETE METABOLIC PANEL WITH GFR   Lipid panel     Genitourinary   CKD (chronic kidney disease) stage 2, GFR 60-89 ml/min    Due to recheck renal function.  We will get that updated today.        Other   Insomnia    She is requesting a refill on her Ambien today.  New prescription sent to pharmacy.  We have sent a temporary prescription back in February until she can get an appointment.      Relevant Medications   zolpidem (AMBIEN) 5 MG tablet   Hyperlipidemia   Relevant Medications   metoprolol succinate (TOPROL-XL) 25 MG 24 hr tablet   losartan-hydrochlorothiazide (HYZAAR) 100-25 MG tablet   amLODipine (NORVASC) 5 MG tablet   Other Relevant Orders   COMPLETE METABOLIC PANEL WITH GFR   Lipid panel   Anxiety state    Currently on sertraline 50 mg and tolerating well without any significant problems.  Refills sent to  pharmacy.      Relevant Medications   sertraline (ZOLOFT) 50 MG tablet      Meds ordered this encounter  Medications  . sertraline (ZOLOFT) 50 MG tablet    Sig: Take 1 tablet (50 mg total) by mouth daily.    Dispense:  90 tablet    Refill:  1  . metoprolol succinate (TOPROL-XL) 25 MG 24 hr tablet    Sig: Take 1 tablet (25 mg total) by mouth daily.    Dispense:  90 tablet    Refill:  1  . losartan-hydrochlorothiazide (HYZAAR) 100-25 MG tablet    Sig: Take 1 tablet by mouth daily.    Dispense:  90 tablet    Refill:  1  . amLODipine (NORVASC) 5 MG tablet    Sig: Take 1 tablet (5 mg total) by mouth daily.    Dispense:  90 tablet    Refill:  1  . zolpidem (AMBIEN) 5 MG tablet    Sig: TAKE ONE TABLET BY MOUTH EVERY NIGHT AT BEDTIME AS NEEDED FOR SLEEP    Dispense:  90 tablet    Refill:  0    Follow-up: Return in about 6 months (around 07/09/2020) for Hypertension.    Beatrice Lecher, MD

## 2020-01-07 NOTE — Assessment & Plan Note (Signed)
Currently on sertraline 50 mg and tolerating well without any significant problems.  Refills sent to pharmacy.

## 2020-01-07 NOTE — Telephone Encounter (Signed)
Pt came back into office and stated that she needed a refill on the Ambien.   Will fwd to pcp for signature .Elouise Munroe, [place]

## 2020-01-07 NOTE — Assessment & Plan Note (Signed)
Due to recheck renal function.  We will get that updated today.

## 2020-01-08 LAB — COMPLETE METABOLIC PANEL WITH GFR
AG Ratio: 1.8 (calc) (ref 1.0–2.5)
ALT: 28 U/L (ref 6–29)
AST: 27 U/L (ref 10–35)
Albumin: 4.7 g/dL (ref 3.6–5.1)
Alkaline phosphatase (APISO): 57 U/L (ref 37–153)
BUN: 19 mg/dL (ref 7–25)
CO2: 29 mmol/L (ref 20–32)
Calcium: 10.1 mg/dL (ref 8.6–10.4)
Chloride: 100 mmol/L (ref 98–110)
Creat: 0.87 mg/dL (ref 0.60–0.88)
GFR, Est African American: 70 mL/min/{1.73_m2} (ref >=60)
GFR, Est Non African American: 61 mL/min/{1.73_m2} (ref >=60)
Globulin: 2.6 g/dL (calc) (ref 1.9–3.7)
Glucose, Bld: 106 mg/dL — ABNORMAL HIGH (ref 65–99)
Potassium: 3.7 mmol/L (ref 3.5–5.3)
Sodium: 139 mmol/L (ref 135–146)
Total Bilirubin: 0.5 mg/dL (ref 0.2–1.2)
Total Protein: 7.3 g/dL (ref 6.1–8.1)

## 2020-01-08 LAB — LIPID PANEL
Cholesterol: 209 mg/dL — ABNORMAL HIGH (ref <200)
HDL: 54 mg/dL (ref >=50)
LDL Cholesterol (Calc): 123 mg/dL (calc) — ABNORMAL HIGH
Non-HDL Cholesterol (Calc): 155 mg/dL (calc) — ABNORMAL HIGH (ref <130)
Total CHOL/HDL Ratio: 3.9 (calc) (ref <5.0)
Triglycerides: 206 mg/dL — ABNORMAL HIGH (ref <150)

## 2020-02-03 DIAGNOSIS — Z23 Encounter for immunization: Secondary | ICD-10-CM | POA: Diagnosis not present

## 2020-02-11 ENCOUNTER — Telehealth: Payer: Self-pay

## 2020-02-11 DIAGNOSIS — F5101 Primary insomnia: Secondary | ICD-10-CM

## 2020-02-11 MED ORDER — ZOLPIDEM TARTRATE 5 MG PO TABS: ORAL_TABLET | ORAL | 0 refills | Status: DC

## 2020-02-11 NOTE — Telephone Encounter (Signed)
Thank you Ang. Med sent

## 2020-02-11 NOTE — Telephone Encounter (Signed)
Belinda Owens has lost her Ambien medication. Per the pharmacy she picked up a 90 day supply on 01/07/2020. They did check the transaction and it was paid for on a debt card. She wanted to know if Dr Madilyn Fireman can send in a 30 day supply with a Good Rx coupon. The prescription will have to read "ok to fill early".  Belinda Owens called for a refill on Ambien. I advised her a 90 day refill was sent to [facility] in March. She called the pharmacy and they told her she has already picked up the prescription. I called the pharmacy and they told me the same. I asked them to pull the transaction of payment. They told me it will take a few hours and will call me back.

## 2020-02-11 NOTE — Telephone Encounter (Signed)
Patient advise.  

## 2020-02-20 DIAGNOSIS — Z6831 Body mass index (BMI) 31.0-31.9, adult: Secondary | ICD-10-CM | POA: Diagnosis not present

## 2020-02-20 DIAGNOSIS — T50905A Adverse effect of unspecified drugs, medicaments and biological substances, initial encounter: Secondary | ICD-10-CM | POA: Diagnosis not present

## 2020-02-20 DIAGNOSIS — E661 Drug-induced obesity: Secondary | ICD-10-CM | POA: Diagnosis not present

## 2020-02-20 DIAGNOSIS — Z9189 Other specified personal risk factors, not elsewhere classified: Secondary | ICD-10-CM | POA: Diagnosis not present

## 2020-02-20 DIAGNOSIS — Z723 Lack of physical exercise: Secondary | ICD-10-CM | POA: Diagnosis not present

## 2020-02-20 DIAGNOSIS — N6092 Unspecified benign mammary dysplasia of left breast: Secondary | ICD-10-CM | POA: Diagnosis not present

## 2020-04-15 ENCOUNTER — Ambulatory Visit: Payer: Medicare Other | Admitting: Family Medicine

## 2020-04-16 ENCOUNTER — Emergency Department (INDEPENDENT_AMBULATORY_CARE_PROVIDER_SITE_OTHER): Payer: Medicare Other

## 2020-04-16 ENCOUNTER — Other Ambulatory Visit: Payer: Self-pay

## 2020-04-16 ENCOUNTER — Emergency Department (INDEPENDENT_AMBULATORY_CARE_PROVIDER_SITE_OTHER)
Admission: EM | Admit: 2020-04-16 | Discharge: 2020-04-16 | Disposition: A | Discharge status: Home / Self Care | Payer: Medicare Other | Source: Home / Self Care | Attending: Family Medicine | Admitting: Family Medicine

## 2020-04-16 DIAGNOSIS — M545 Low back pain: Secondary | ICD-10-CM | POA: Diagnosis not present

## 2020-04-16 DIAGNOSIS — M542 Cervicalgia: Secondary | ICD-10-CM

## 2020-04-16 DIAGNOSIS — X500XXA Overexertion from strenuous movement or load, initial encounter: Secondary | ICD-10-CM

## 2020-04-16 MED ORDER — PREDNISONE 20 MG PO TABS: ORAL_TABLET | ORAL | 0 refills | Status: DC

## 2020-04-16 NOTE — Discharge Instructions (Addendum)
Apply ice pack for 20 to 30 minutes, 3 to 4 times daily  Continue until pain decreases.  May take Tylenol as needed for pain. 

## 2020-04-16 NOTE — ED Triage Notes (Signed)
Pt c/o neck pain since last night. Says she carried a lot of heavy groceries in the house yesterday. Started later that evening. Tylenol last night.

## 2020-04-16 NOTE — ED Provider Notes (Signed)
[name]    CSN: [id number] Arrival date & time: 04/16/20  3220      History   Chief Complaint Chief Complaint  Patient presents with  . Neck Pain    HPI Belinda Owens is a 86 y.o. female.   Patient complains of onset of left neck pain last night after lifting heavy groceries yesterday.  She recalls no injury however.  Her pain does not radiate into her arm.  She has had no improvement with warm compresses and Tylenol. Review of past records reveals a history of left side neck and shoulder pain 03/16/15.  C-spine x-rays at that time showed degenerative disc disease and mild foraminal encroachment.   The history is provided by the patient.    Past Medical History:  Diagnosis Date  . Fibrocystic breast changes of both breasts    Gets yearly mammo and [place]  . History of rheumatic fever     Patient Active Problem List   Diagnosis Date Noted  . Swallowed foreign body 04/01/2019  . Primary osteoarthritis of right wrist 10/02/2018  . De Quervain's tenosynovitis, right 10/02/2018  . CKD (chronic kidney disease) stage 2, GFR 60-89 ml/min 08/18/2018  . Myalgia due to statin 02/19/2018  . Adenoma of duodenum 04/10/2017  . Dermatochalasis of both upper eyelids 12/14/2016  . Atypical ductal hyperplasia of left breast 12/01/2016  . Sedentary lifestyle 12/01/2016  . Class 1 drug-induced obesity without serious comorbidity with body mass index (BMI) of 31.0 to 31.9 in adult 12/01/2016  . At risk for breast cancer 12/01/2016  . Primary osteoarthritis of left knee 06/08/2016  . Osteopenia 09/29/2015  . Trochanteric bursitis of right hip 05/05/2015  . Ear ringing sound 04/27/2015  . Bilateral shoulder pain 03/16/2015  . Degenerative disc disease, cervical 03/16/2015  . Insomnia 03/09/2015  . Hyperlipidemia 02/03/2015  . Essential hypertension 02/03/2015  . DDD (degenerative disc disease), lumbar 02/03/2015  . GERD (gastroesophageal reflux disease) 02/03/2015  .  Anxiety state 02/03/2015  . Fibrocystic breast changes of both breasts 02/03/2015  . Microscopic hematuria 02/03/2015  . Allergic bronchitis 02/03/2015    Past Surgical History:  Procedure Laterality Date  . BREAST BIOPSY    . MENISCUS REPAIR Right   . RECONSTRUCTION OF EYELID Bilateral 12/14/2016  . ROTATOR CUFF REPAIR Right     OB History    Gravida  3   Para  3   Term  3   Preterm      AB      Living        SAB      TAB      Ectopic      Multiple      Live Births  3            Home Medications    Prior to Admission medications   Medication Sig Start Date End Date Taking? Authorizing Provider  albuterol (PROVENTIL HFA;VENTOLIN HFA) 108 (90 Base) MCG/ACT inhaler Inhale 2 puffs into the lungs every 6 (six) hours as needed for wheezing or shortness of breath. 01/16/19   Hali Marry, MD  amLODipine (NORVASC) 5 MG tablet Take 1 tablet (5 mg total) by mouth daily. 01/07/20   Hali Marry, MD  aspirin 81 MG tablet Take 81 mg by mouth daily.    [provider]  losartan-hydrochlorothiazide (HYZAAR) 100-25 MG tablet Take 1 tablet by mouth daily. 01/07/20   Hali Marry, MD  metoprolol succinate (TOPROL-XL) 25 MG 24 hr tablet  Take 1 tablet (25 mg total) by mouth daily. 01/07/20   Hali Marry, MD  pravastatin (PRAVACHOL) 20 MG tablet TAKE ONE TABLET BY MOUTH DAILY 07/15/19   Hali Marry, MD  predniSONE (DELTASONE) 20 MG tablet Take one tab by mouth twice daily for 4 days, then one daily for 3 days. Take with food. 04/16/20   Kandra Nicolas, MD  sertraline (ZOLOFT) 50 MG tablet Take 1 tablet (50 mg total) by mouth daily. 01/07/20   Hali Marry, MD  zolpidem (AMBIEN) 5 MG tablet TAKE ONE TABLET BY MOUTH EVERY NIGHT AT BEDTIME AS NEEDED FOR SLEEP 02/11/20   Hali Marry, MD    Family History Family History  Problem Relation Age of Onset  . Breast cancer Maternal Aunt   . Colon cancer Father   .  Asthma Daughter   . Heart disease Mother   . Uterine cancer Mother   . Cancer - Other Brother     Social History Social History   Tobacco Use  . Smoking status: Former Smoker    Types: Cigarettes    Quit date: 10/23/1957    Years since quitting: 62.5  . Smokeless tobacco: Never Used  Vaping Use  . Vaping Use: Never used  Substance Use Topics  . Alcohol use: Not Currently    Alcohol/week: 0.0 standard drinks  . Drug use: No     Allergies   Atenolol, Atorvastatin, Fluoxetine, Omeprazole, and Tylenol with codeine #3  [acetaminophen-codeine]   Review of Systems Review of Systems  Constitutional: Negative.   HENT: Negative.   Eyes: Negative.   Respiratory: Negative.   Cardiovascular: Negative.   Gastrointestinal: Negative.   Genitourinary: Negative.   Musculoskeletal: Positive for neck pain and neck stiffness.  Skin: Negative.   Neurological: Negative for weakness and headaches.     Physical Exam Triage Vital Signs ED Triage Vitals  Enc Vitals Group     BP 04/16/20 1008 (!) 144/83     Pulse Rate 04/16/20 1008 73     Resp 04/16/20 1008 18     Temp 04/16/20 1008 98.3 F (36.8 C)     Temp Source 04/16/20 1008 Oral     SpO2 04/16/20 1008 97 %     Weight --      Height --      Head Circumference --      Peak Flow --      Pain Score 04/16/20 1010 3     Pain Loc --      Pain Edu? --      Excl. in [place]? --    No data found.  Updated Vital Signs BP (!) 144/83 (BP Location: Right Arm)   Pulse 73   Temp 98.3 F (36.8 C) (Oral)   Resp 18   SpO2 97%   Visual Acuity Right Eye Distance:   Left Eye Distance:   Bilateral Distance:    Right Eye Near:   Left Eye Near:    Bilateral Near:     Physical Exam Vitals and nursing note reviewed.  Constitutional:      General: She is not in acute distress. HENT:     Head: Normocephalic.     Right Ear: External ear normal.     Left Ear: External ear normal.     Nose: Nose normal.     Mouth/Throat:     Pharynx:  Oropharynx is clear.  Eyes:     Conjunctiva/sclera: Conjunctivae normal.     Pupils:  Pupils are equal, round, and reactive to light.  Neck:      Comments: Left neck has tenderness to palpation over sternocleidomastoid muscle and trapezius muscle. Cardiovascular:     Rate and Rhythm: Regular rhythm.     Heart sounds: Normal heart sounds.  Pulmonary:     Breath sounds: Normal breath sounds.  Musculoskeletal:     Cervical back: No erythema or signs of trauma. Pain with movement and muscular tenderness present. Decreased range of motion.  Lymphadenopathy:     Cervical: No cervical adenopathy.  Skin:    General: Skin is warm and dry.     Findings: No rash.  Neurological:     Mental Status: She is alert and oriented to person, place, and time.      UC Treatments / Results  Labs (all labs ordered are listed, but only abnormal results are displayed) Labs Reviewed - No data to display  EKG   Radiology DG Cervical Spine Complete  Result Date: 04/16/2020 CLINICAL DATA:  Neck pain following heavy lifting yesterday, initial encounter EXAM: CERVICAL SPINE - COMPLETE 4+ VIEW COMPARISON:  03/16/2015 FINDINGS: Seven cervical segments are well visualized. Vertebral body height is well maintained. Multilevel disc space narrowing and osteophytic changes are seen. Mild neural foraminal narrowing is noted bilaterally. The odontoid is within normal limits. No prevertebral soft tissue abnormality is seen. IMPRESSION: Multilevel degenerative change without acute abnormality. Electronically Signed   By: Inez Catalina M.D.   On: 04/16/2020 11:49    Procedures Procedures (including critical care time)  Medications Ordered in UC Medications - No data to display  Initial Impression / Assessment and Plan / UC Course  I have reviewed the triage vital signs and the nursing notes.  Pertinent labs & imaging results that were available during my care of the patient were reviewed by me and considered in my  medical decision making (see chart for details).    Suspect early cervical radiculopathy.  Begin prednisone burst/taper. Followup with Dr. Aundria Mems ([facility]) if not improving about two weeks.    Final Clinical Impressions(s) / UC Diagnoses   Final diagnoses:  Neck pain on left side     Discharge Instructions     Apply ice pack for 20 to 30 minutes, 3 to 4 times daily  Continue until pain decreases.  May take Tylenol as needed for pain.    ED Prescriptions    Medication Sig Dispense Auth. Provider   predniSONE (DELTASONE) 20 MG tablet Take one tab by mouth twice daily for 4 days, then one daily for 3 days. Take with food. 11 tablet Kandra Nicolas, MD        Kandra Nicolas, MD 04/18/20 [phone]

## 2020-04-20 ENCOUNTER — Other Ambulatory Visit: Payer: Self-pay | Admitting: Family Medicine

## 2020-05-10 ENCOUNTER — Other Ambulatory Visit: Payer: Self-pay

## 2020-05-10 ENCOUNTER — Encounter: Payer: Self-pay | Admitting: Sports Medicine

## 2020-05-10 ENCOUNTER — Ambulatory Visit (INDEPENDENT_AMBULATORY_CARE_PROVIDER_SITE_OTHER): Payer: Medicare Other | Admitting: Sports Medicine

## 2020-05-10 ENCOUNTER — Ambulatory Visit (INDEPENDENT_AMBULATORY_CARE_PROVIDER_SITE_OTHER): Payer: Medicare Other

## 2020-05-10 DIAGNOSIS — M1712 Unilateral primary osteoarthritis, left knee: Secondary | ICD-10-CM

## 2020-05-10 DIAGNOSIS — M1711 Unilateral primary osteoarthritis, right knee: Secondary | ICD-10-CM | POA: Diagnosis not present

## 2020-05-10 NOTE — Assessment & Plan Note (Signed)
Belinda Owens returns, she is a very pleasant 86 year old female with known left knee osteoarthritis, worsening of pain, today we injected her knee, this was last injected in 2017 she declines any oral medications. Getting updated x-rays, return to see me in 1 month as needed.

## 2020-05-10 NOTE — Progress Notes (Signed)
    Procedures performed today:    Procedure: Real-time Ultrasound Guided injection of the left knee Device: Samsung HS60  Verbal informed consent obtained.  Time-out conducted.  Noted no overlying erythema, induration, or other signs of local infection.  Skin prepped in a sterile fashion.  Local anesthesia: Topical Ethyl chloride.  With sterile technique and under real time ultrasound guidance: 1 cc Kenalog 40, 2 cc lidocaine, 2 cc bupivacaine injected easily Completed without difficulty  Pain immediately resolved suggesting accurate placement of the medication.  Advised to call if fevers/chills, erythema, induration, drainage, or persistent bleeding.  Images permanently stored and available for review in the ultrasound unit.  Impression: Technically successful ultrasound guided injection.  Independent interpretation of notes and tests performed by another provider:   None.  Brief History, Exam, Impression, and Recommendations:    Primary osteoarthritis of left knee Belinda Owens returns, she is a very pleasant 86 year old female with known left knee osteoarthritis, worsening of pain, today we injected her knee, this was last injected in 2017 she declines any oral medications. Getting updated x-rays, return to see me in 1 month as needed.    ___________________________________________ Gwen Her. Dianah Field, M.D., ABFM., CAQSM. Primary Care and [place] Instructor of [place] [facility]

## 2020-05-24 ENCOUNTER — Ambulatory Visit: Payer: Medicare Other | Admitting: Sports Medicine

## 2020-06-22 ENCOUNTER — Other Ambulatory Visit: Payer: Self-pay | Admitting: Family Medicine

## 2020-06-22 DIAGNOSIS — F5101 Primary insomnia: Secondary | ICD-10-CM

## 2020-07-13 ENCOUNTER — Encounter: Payer: Self-pay | Admitting: Family Medicine

## 2020-07-13 ENCOUNTER — Ambulatory Visit (INDEPENDENT_AMBULATORY_CARE_PROVIDER_SITE_OTHER): Payer: Medicare Other | Admitting: Family Medicine

## 2020-07-13 VITALS — BP 133/69 | HR 71 | Ht 64.0 in | Wt 168.0 lb

## 2020-07-13 DIAGNOSIS — I10 Essential (primary) hypertension: Secondary | ICD-10-CM

## 2020-07-13 DIAGNOSIS — F5101 Primary insomnia: Secondary | ICD-10-CM

## 2020-07-13 DIAGNOSIS — F411 Generalized anxiety disorder: Secondary | ICD-10-CM | POA: Diagnosis not present

## 2020-07-13 DIAGNOSIS — N182 Chronic kidney disease, stage 2 (mild): Secondary | ICD-10-CM | POA: Diagnosis not present

## 2020-07-13 DIAGNOSIS — N6092 Unspecified benign mammary dysplasia of left breast: Secondary | ICD-10-CM

## 2020-07-13 MED ORDER — SERTRALINE HCL 25 MG PO TABS: 25.0000 mg | ORAL_TABLET | Freq: Every day | ORAL | Status: DC

## 2020-07-13 NOTE — Progress Notes (Signed)
Established Patient Office Visit  Subjective:  Patient ID: Belinda Owens, female    DOB: Dec 15, 1931  Age: 86 y.o. MRN: [id number]  CC:  Chief Complaint  Patient presents with  . Hypertension    HPI Belinda Owens presents for 6 mo f/u.   Hypertension- Pt denies chest pain, SOB, dizziness, or heart palpitations.  Taking meds as directed w/o problems.  Denies medication side effects.    Follow-up anxiety-on sertraline 50 mg daily.  She is actually taking a half a tab daily but feels like it is actually working really well she denies any depressive symptoms or anxiety symptoms.  In fact her PHQ-9 and GAD-7 scores are 0.  He has received her Covid vaccine series.  Follow-up Ambien.  She asked 5 mg as needed and feels like she rests well when she uses it.  Past Medical History:  Diagnosis Date  . Fibrocystic breast changes of both breasts    Gets yearly mammo and [place]  . History of rheumatic fever     Past Surgical History:  Procedure Laterality Date  . BREAST BIOPSY    . MENISCUS REPAIR Right   . RECONSTRUCTION OF EYELID Bilateral 12/14/2016  . ROTATOR CUFF REPAIR Right     Family History  Problem Relation Age of Onset  . Breast cancer Maternal Aunt   . Colon cancer Father   . Asthma Daughter   . Heart disease Mother   . Uterine cancer Mother   . Cancer - Other Brother     Social History   Socioeconomic History  . Marital status: Divorced    Spouse name: Not on file  . Number of children: 3  . Years of education: some colle  . Highest education level: Some college, no degree  Occupational History  . Occupation: retired.     Comment: [employer]  Tobacco Use  . Smoking status: Former Smoker    Types: Cigarettes    Quit date: 10/23/1957    Years since quitting: 62.7  . Smokeless tobacco: Never Used  Vaping Use  . Vaping Use: Never used  Substance and Sexual Activity  . Alcohol use: Not Currently    Alcohol/week: 0.0 standard drinks  . Drug use: No  .  Sexual activity: Never    Partners: Male    Birth control/protection: None  Other Topics Concern  . Not on file  Social History Narrative   Drinks decaf coffee.  Quit smoking 1959.    Social Determinants of Health   Financial Resource Strain: Low Risk   . Difficulty of Paying Living Expenses: Not hard at all  Food Insecurity: No Food Insecurity  . Worried About Charity fundraiser in the Last Year: Never true  . Ran Out of Food in the Last Year: Never true  Transportation Needs: No Transportation Needs  . Lack of Transportation (Medical): No  . Lack of Transportation (Non-Medical): No  Physical Activity: Inactive  . Days of Exercise per Week: 0 days  . Minutes of Exercise per Session: 0 min  Stress: No Stress Concern Present  . Feeling of Stress : Not at all  Social Connections: Moderately Isolated  . Frequency of Communication with Friends and Family: More than three times a week  . Frequency of Social Gatherings with Friends and Family: Once a week  . Attends Religious Services: More than 4 times per year  . Active Member of Clubs or Organizations: No  . Attends Archivist Meetings: Never  . Marital  Status: Divorced  Human resources officer Violence: Not At Risk  . Fear of Current or Ex-Partner: No  . Emotionally Abused: No  . Physically Abused: No  . Sexually Abused: No    Outpatient Medications Prior to Visit  Medication Sig Dispense Refill  . albuterol (PROVENTIL HFA;VENTOLIN HFA) 108 (90 Base) MCG/ACT inhaler Inhale 2 puffs into the lungs every 6 (six) hours as needed for wheezing or shortness of breath. 18 g 1  . amLODipine (NORVASC) 5 MG tablet Take 1 tablet (5 mg total) by mouth daily. 90 tablet 1  . aspirin 81 MG tablet Take 81 mg by mouth daily.    Marland Kitchen losartan-hydrochlorothiazide (HYZAAR) 100-25 MG tablet Take 1 tablet by mouth daily. 90 tablet 1  . metoprolol succinate (TOPROL-XL) 25 MG 24 hr tablet Take 1 tablet (25 mg total) by mouth daily. 90 tablet 1  .  pravastatin (PRAVACHOL) 20 MG tablet TAKE ONE TABLET BY MOUTH DAILY 90 tablet 1  . zolpidem (AMBIEN) 5 MG tablet TAKE ONE TABLET BY MOUTH EVERY NIGHT AT BEDTIME AS NEEDED FOR SLEEP 30 tablet 0  . sertraline (ZOLOFT) 50 MG tablet Take 1 tablet (50 mg total) by mouth daily. (Patient taking differently: Take 25 mg by mouth daily. ) 90 tablet 1   No facility-administered medications prior to visit.    Allergies  Allergen Reactions  . Atenolol Other (See Comments) and Rash    Headache Headache  . Atorvastatin Other (See Comments)    Myalgia  . Fluoxetine Other (See Comments) and Rash    Tearfulness  . Omeprazole Itching  . Tylenol With Codeine #3  [Acetaminophen-Codeine] Nausea And Vomiting    ROS Review of Systems    Objective:    Physical Exam Constitutional:      Appearance: She is well-developed.  HENT:     Head: Normocephalic and atraumatic.  Cardiovascular:     Rate and Rhythm: Normal rate and regular rhythm.     Heart sounds: Normal heart sounds.  Pulmonary:     Effort: Pulmonary effort is normal.     Breath sounds: Normal breath sounds.  Skin:    General: Skin is warm and dry.  Neurological:     Mental Status: She is alert and oriented to person, place, and time.  Psychiatric:        Behavior: Behavior normal.     BP 133/69   Pulse 71   Ht 5\' 4"  (1.626 m)   Wt 168 lb (76.2 kg)   SpO2 97%   BMI 28.84 kg/m  Wt Readings from Last 3 Encounters:  07/13/20 168 lb (76.2 kg)  01/07/20 171 lb (77.6 kg)  07/29/19 171 lb (77.6 kg)     Health Maintenance Due  Topic Date Due  . INFLUENZA VACCINE  05/23/2020    There are no preventive care reminders to display for this patient.  Lab Results  Component Value Date   TSH 2.43 02/19/2018   Lab Results  Component Value Date   WBC 6.6 02/19/2018   HGB 13.9 02/19/2018   HCT 40.3 02/19/2018   MCV 85.4 02/19/2018   PLT 276 02/19/2018   Lab Results  Component Value Date   NA 139 01/07/2020   K 3.7  01/07/2020   CO2 29 01/07/2020   GLUCOSE 106 (H) 01/07/2020   BUN 19 01/07/2020   CREATININE 0.87 01/07/2020   BILITOT 0.5 01/07/2020   ALKPHOS 56 07/06/2016   AST 27 01/07/2020   ALT 28 01/07/2020   PROT 7.3  01/07/2020   ALBUMIN 4.3 07/06/2016   CALCIUM 10.1 01/07/2020   Lab Results  Component Value Date   CHOL 209 (H) 01/07/2020   Lab Results  Component Value Date   HDL 54 01/07/2020   Lab Results  Component Value Date   LDLCALC 123 (H) 01/07/2020   Lab Results  Component Value Date   TRIG 206 (H) 01/07/2020   Lab Results  Component Value Date   CHOLHDL 3.9 01/07/2020   No results found for: HGBA1C    Assessment & Plan:   Problem List Items Addressed This Visit      Cardiovascular and Mediastinum   Essential hypertension - Primary    Well controlled. Continue current regimen. Follow up in  6 mo . Due for BMP      Relevant Orders   BASIC METABOLIC PANEL WITH GFR     Genitourinary   CKD (chronic kidney disease) stage 2, GFR 60-89 ml/min    Renal function every 6 months.        Other   Insomnia    His Ambien as needed.  Just filled earlier this month.  Follow up in 6 months.      Atypical ductal hyperplasia of left breast    Follows with Dr. Georgiann Cocker. Last seen this summer. No recent changes.       Anxiety state    Early well on just a low dose of sertraline 25 mg.  When she is due for renewal then we will switch to the 25 mg tab so she is not having to split them.      Relevant Medications   sertraline (ZOLOFT) 25 MG tablet     He is interested in getting the flu vaccine but unfortunately we are out of high-dose right now.  Meds ordered this encounter  Medications  . sertraline (ZOLOFT) 25 MG tablet    Sig: Take 1 tablet (25 mg total) by mouth daily.    Follow-up: Return in about 6 months (around 01/10/2021) for Mood and BP and labs.    Beatrice Lecher, MD

## 2020-07-13 NOTE — Assessment & Plan Note (Signed)
Renal function every 6 months. °

## 2020-07-13 NOTE — Assessment & Plan Note (Signed)
Well controlled. Continue current regimen. Follow up in  6 mo . Due for BMP 

## 2020-07-13 NOTE — Assessment & Plan Note (Signed)
Follows with Dr. Georgiann Cocker. Last seen this summer. No recent changes.

## 2020-07-13 NOTE — Assessment & Plan Note (Signed)
Early well on just a low dose of sertraline 25 mg.  When she is due for renewal then we will switch to the 25 mg tab so she is not having to split them.

## 2020-07-13 NOTE — Assessment & Plan Note (Signed)
His Ambien as needed.  Just filled earlier this month.  Follow up in 6 months.

## 2020-07-15 DIAGNOSIS — Z23 Encounter for immunization: Secondary | ICD-10-CM | POA: Diagnosis not present

## 2020-07-25 ENCOUNTER — Other Ambulatory Visit: Payer: Self-pay | Admitting: Family Medicine

## 2020-07-25 DIAGNOSIS — I10 Essential (primary) hypertension: Secondary | ICD-10-CM

## 2020-08-23 DIAGNOSIS — Z9189 Other specified personal risk factors, not elsewhere classified: Secondary | ICD-10-CM | POA: Diagnosis not present

## 2020-08-23 DIAGNOSIS — Z6831 Body mass index (BMI) 31.0-31.9, adult: Secondary | ICD-10-CM | POA: Diagnosis not present

## 2020-08-23 DIAGNOSIS — E661 Drug-induced obesity: Secondary | ICD-10-CM | POA: Diagnosis not present

## 2020-08-23 DIAGNOSIS — N6092 Unspecified benign mammary dysplasia of left breast: Secondary | ICD-10-CM | POA: Diagnosis not present

## 2020-08-23 DIAGNOSIS — Z1231 Encounter for screening mammogram for malignant neoplasm of breast: Secondary | ICD-10-CM | POA: Diagnosis not present

## 2020-09-20 ENCOUNTER — Other Ambulatory Visit: Payer: Self-pay | Admitting: Family Medicine

## 2020-09-20 DIAGNOSIS — F411 Generalized anxiety disorder: Secondary | ICD-10-CM

## 2020-09-20 MED ORDER — SERTRALINE HCL 25 MG PO TABS: 25.0000 mg | ORAL_TABLET | Freq: Every day | ORAL | 0 refills | Status: DC

## 2020-09-23 DIAGNOSIS — Z23 Encounter for immunization: Secondary | ICD-10-CM | POA: Diagnosis not present

## 2020-10-13 ENCOUNTER — Other Ambulatory Visit: Payer: Self-pay | Admitting: Physician Assistant

## 2020-10-28 ENCOUNTER — Ambulatory Visit: Payer: Medicare Other | Admitting: Sports Medicine

## 2020-11-09 ENCOUNTER — Ambulatory Visit: Payer: Medicare HMO | Admitting: Sports Medicine

## 2020-11-16 ENCOUNTER — Other Ambulatory Visit: Payer: Self-pay

## 2020-11-16 ENCOUNTER — Ambulatory Visit (INDEPENDENT_AMBULATORY_CARE_PROVIDER_SITE_OTHER): Payer: Medicare HMO

## 2020-11-16 ENCOUNTER — Ambulatory Visit (INDEPENDENT_AMBULATORY_CARE_PROVIDER_SITE_OTHER): Payer: Medicare HMO | Admitting: Sports Medicine

## 2020-11-16 DIAGNOSIS — M19031 Primary osteoarthritis, right wrist: Secondary | ICD-10-CM

## 2020-11-16 DIAGNOSIS — M439 Deforming dorsopathy, unspecified: Secondary | ICD-10-CM | POA: Diagnosis not present

## 2020-11-16 NOTE — Progress Notes (Signed)
    Procedures performed today:    Procedure: Real-time Ultrasound Guided injection of the right radiocarpal joint Device: Samsung HS60  Verbal informed consent obtained.  Time-out conducted.  Noted no overlying erythema, induration, or other signs of local infection.  Skin prepped in a sterile fashion.  Local anesthesia: Topical Ethyl chloride.  With sterile technique and under real time ultrasound guidance: 1 cc Kenalog 40, 1 cc lidocaine, 1 cc bupivacaine injected easily Completed without difficulty  Advised to call if fevers/chills, erythema, induration, drainage, or persistent bleeding.  Images permanently stored and available for review in PACS.  Impression: Technically successful ultrasound guided injection.  Independent interpretation of notes and tests performed by another provider:   None.  Brief History, Exam, Impression, and Recommendations:    Primary osteoarthritis of right wrist This is a very pleasant 87 year old female with posttraumatic osteoarthritis from a distant wrist fracture, this did really well after a radiocarpal joint injection back in December 2019. Today we are to get updated x-rays and repeat her wrist joint injection, as she is having recurrence of pain, return to see me as needed.    ___________________________________________ Gwen Her. Dianah Field, M.D., ABFM., CAQSM. Primary Care and [place] Instructor of [place] [facility] of Medicine

## 2020-11-16 NOTE — Assessment & Plan Note (Signed)
This is a very pleasant 87 year old female with posttraumatic osteoarthritis from a distant wrist fracture, this did really well after a radiocarpal joint injection back in December 2019. Today we are to get updated x-rays and repeat her wrist joint injection, as she is having recurrence of pain, return to see me as needed.

## 2020-12-06 DIAGNOSIS — Z1231 Encounter for screening mammogram for malignant neoplasm of breast: Secondary | ICD-10-CM | POA: Diagnosis not present

## 2020-12-06 DIAGNOSIS — N6092 Unspecified benign mammary dysplasia of left breast: Secondary | ICD-10-CM | POA: Diagnosis not present

## 2020-12-08 DIAGNOSIS — N6001 Solitary cyst of right breast: Secondary | ICD-10-CM | POA: Diagnosis not present

## 2020-12-08 DIAGNOSIS — R922 Inconclusive mammogram: Secondary | ICD-10-CM | POA: Diagnosis not present

## 2020-12-08 DIAGNOSIS — R928 Other abnormal and inconclusive findings on diagnostic imaging of breast: Secondary | ICD-10-CM | POA: Diagnosis not present

## 2020-12-16 ENCOUNTER — Other Ambulatory Visit: Payer: Self-pay | Admitting: Family Medicine

## 2020-12-16 ENCOUNTER — Other Ambulatory Visit: Payer: Self-pay | Admitting: *Deleted

## 2020-12-30 ENCOUNTER — Other Ambulatory Visit: Payer: Self-pay | Admitting: *Deleted

## 2020-12-30 DIAGNOSIS — I10 Essential (primary) hypertension: Secondary | ICD-10-CM

## 2020-12-30 MED ORDER — AMLODIPINE BESYLATE 5 MG PO TABS: 5.0000 mg | ORAL_TABLET | Freq: Every day | ORAL | 1 refills | Status: DC

## 2021-01-10 ENCOUNTER — Other Ambulatory Visit: Payer: Self-pay

## 2021-01-10 ENCOUNTER — Encounter: Payer: Self-pay | Admitting: Family Medicine

## 2021-01-10 ENCOUNTER — Ambulatory Visit (INDEPENDENT_AMBULATORY_CARE_PROVIDER_SITE_OTHER): Payer: Medicare HMO | Admitting: Family Medicine

## 2021-01-10 VITALS — BP 138/63 | HR 56 | Ht 64.0 in | Wt 171.0 lb

## 2021-01-10 DIAGNOSIS — Z8673 Personal history of transient ischemic attack (TIA), and cerebral infarction without residual deficits: Secondary | ICD-10-CM | POA: Diagnosis not present

## 2021-01-10 DIAGNOSIS — I10 Essential (primary) hypertension: Secondary | ICD-10-CM

## 2021-01-10 DIAGNOSIS — F411 Generalized anxiety disorder: Secondary | ICD-10-CM

## 2021-01-10 DIAGNOSIS — T466X5A Adverse effect of antihyperlipidemic and antiarteriosclerotic drugs, initial encounter: Secondary | ICD-10-CM | POA: Diagnosis not present

## 2021-01-10 DIAGNOSIS — E785 Hyperlipidemia, unspecified: Secondary | ICD-10-CM | POA: Diagnosis not present

## 2021-01-10 DIAGNOSIS — F5101 Primary insomnia: Secondary | ICD-10-CM | POA: Diagnosis not present

## 2021-01-10 DIAGNOSIS — M791 Myalgia, unspecified site: Secondary | ICD-10-CM | POA: Diagnosis not present

## 2021-01-10 DIAGNOSIS — N182 Chronic kidney disease, stage 2 (mild): Secondary | ICD-10-CM

## 2021-01-10 NOTE — Assessment & Plan Note (Signed)
Due to recheck lipids. 

## 2021-01-10 NOTE — Progress Notes (Signed)
Established Patient Office Visit  Subjective:  Patient ID: Belinda Owens, female    DOB: [birth year]  Age: 87 y.o. MRN: [id number]  CC:  Chief Complaint  Patient presents with  . Hypertension    HPI Belinda Owens presents for 6 mo f/U.   Hypertension- Pt denies chest pain, SOB, dizziness, or heart palpitations.  Taking meds as directed w/o problems.  Denies medication side effects.    F/U CKD -no recent changes.  Follow-up insomnia-she is completely off of Ambien now and only using melatonin over-the-counter.  She did want to discuss whether or not to continue with aspirin therapy or not.   Past Medical History:  Diagnosis Date  . Fibrocystic breast changes of both breasts    Gets yearly mammo and [place]  . History of rheumatic fever     Past Surgical History:  Procedure Laterality Date  . BREAST BIOPSY    . MENISCUS REPAIR Right   . RECONSTRUCTION OF EYELID Bilateral 12/14/2016  . ROTATOR CUFF REPAIR Right     Family History  Problem Relation Age of Onset  . Breast cancer Maternal Aunt   . Colon cancer Father   . Asthma Daughter   . Heart disease Mother   . Uterine cancer Mother   . Cancer - Other Brother     Social History   Socioeconomic History  . Marital status: Divorced    Spouse name: Not on file  . Number of children: 3  . Years of education: some colle  . Highest education level: Some college, no degree  Occupational History  . Occupation: retired.     Comment: [employer]  Tobacco Use  . Smoking status: Former Smoker    Types: Cigarettes    Quit date: 10/23/1957    Years since quitting: 63.2  . Smokeless tobacco: Never Used  Vaping Use  . Vaping Use: Never used  Substance and Sexual Activity  . Alcohol use: Not Currently    Alcohol/week: 0.0 standard drinks  . Drug use: No  . Sexual activity: Never    Partners: Male    Birth control/protection: None  Other Topics Concern  . Not on file  Social History Narrative   Drinks decaf  coffee.  Quit smoking 1959.    Social Determinants of Health   Financial Resource Strain: Not on file  Food Insecurity: Not on file  Transportation Needs: Not on file  Physical Activity: Not on file  Stress: Not on file  Social Connections: Not on file  Intimate Partner Violence: Not on file    Outpatient Medications Prior to Visit  Medication Sig Dispense Refill  . albuterol (PROVENTIL HFA;VENTOLIN HFA) 108 (90 Base) MCG/ACT inhaler Inhale 2 puffs into the lungs every 6 (six) hours as needed for wheezing or shortness of breath. 18 g 1  . amLODipine (NORVASC) 5 MG tablet Take 1 tablet (5 mg total) by mouth daily. 90 tablet 1  . aspirin 81 MG tablet Take 81 mg by mouth daily.    Marland Kitchen losartan-hydrochlorothiazide (HYZAAR) 100-25 MG tablet TAKE ONE TABLET BY MOUTH DAILY 90 tablet 1  . metoprolol succinate (TOPROL-XL) 25 MG 24 hr tablet TAKE ONE TABLET BY MOUTH DAILY 90 tablet 1  . pravastatin (PRAVACHOL) 20 MG tablet TAKE ONE TABLET BY MOUTH DAILY 90 tablet 0  . sertraline (ZOLOFT) 25 MG tablet TAKE ONE TABLET BY MOUTH DAILY 90 tablet 1  . zolpidem (AMBIEN) 5 MG tablet TAKE ONE TABLET BY MOUTH EVERY NIGHT AT BEDTIME AS NEEDED  FOR SLEEP 30 tablet 0   No facility-administered medications prior to visit.    Allergies  Allergen Reactions  . Atenolol Other (See Comments) and Rash    Headache Headache  . Atorvastatin Other (See Comments)    Myalgia  . Fluoxetine Other (See Comments) and Rash    Tearfulness  . Omeprazole Itching  . Tylenol With Codeine #3  [Acetaminophen-Codeine] Nausea And Vomiting    ROS Review of Systems    Objective:    Physical Exam Constitutional:      Appearance: She is well-developed.  HENT:     Head: Normocephalic and atraumatic.  Cardiovascular:     Rate and Rhythm: Normal rate and regular rhythm.     Heart sounds: Normal heart sounds.  Pulmonary:     Effort: Pulmonary effort is normal.     Breath sounds: Normal breath sounds.  Skin:    General:  Skin is warm and dry.  Neurological:     Mental Status: She is alert and oriented to person, place, and time.  Psychiatric:        Behavior: Behavior normal.     BP 138/63   Pulse (!) 56   Ht 5\' 4"  (1.626 m)   Wt 171 lb (77.6 kg)   SpO2 100%   BMI 29.35 kg/m  Wt Readings from Last 3 Encounters:  01/10/21 171 lb (77.6 kg)  07/13/20 168 lb (76.2 kg)  01/07/20 171 lb (77.6 kg)     Health Maintenance Due  Topic Date Due  . COVID-19 Vaccine (3 - Booster for Moderna series) 08/04/2020    There are no preventive care reminders to display for this patient.  Lab Results  Component Value Date   TSH 2.43 02/19/2018   Lab Results  Component Value Date   WBC 6.6 02/19/2018   HGB 13.9 02/19/2018   HCT 40.3 02/19/2018   MCV 85.4 02/19/2018   PLT 276 02/19/2018   Lab Results  Component Value Date   NA 139 01/07/2020   K 3.7 01/07/2020   CO2 29 01/07/2020   GLUCOSE 106 (H) 01/07/2020   BUN 19 01/07/2020   CREATININE 0.87 01/07/2020   BILITOT 0.5 01/07/2020   ALKPHOS 56 07/06/2016   AST 27 01/07/2020   ALT 28 01/07/2020   PROT 7.3 01/07/2020   ALBUMIN 4.3 07/06/2016   CALCIUM 10.1 01/07/2020   Lab Results  Component Value Date   CHOL 209 (H) 01/07/2020   Lab Results  Component Value Date   HDL 54 01/07/2020   Lab Results  Component Value Date   LDLCALC 123 (H) 01/07/2020   Lab Results  Component Value Date   TRIG 206 (H) 01/07/2020   Lab Results  Component Value Date   CHOLHDL 3.9 01/07/2020   No results found for: HGBA1C    Assessment & Plan:   Problem List Items Addressed This Visit      Cardiovascular and Mediastinum   Essential hypertension - Primary    Well controlled on triple therapy. Continue current regimen. Follow up in  6 mo       Relevant Orders   COMPLETE METABOLIC PANEL WITH GFR   Lipid panel   CBC     Genitourinary   CKD (chronic kidney disease) stage 2, GFR 60-89 ml/min    Due to recheck renal function.  Continue to keep  blood pressure well controlled.      Relevant Orders   COMPLETE METABOLIC PANEL WITH GFR   Lipid panel  CBC     Other   Myalgia due to statin   Insomnia    Well-controlled off of Ambien now using melatonin.      Hyperlipidemia    Due to recheck lipids.        Relevant Orders   COMPLETE METABOLIC PANEL WITH GFR   Lipid panel   CBC   History of TIA (transient ischemic attack)    Discussed continuing baby aspirin daily with warning signs and symptoms for side effects of chronic aspirin use.  She says she tolerates that well      Anxiety state    Stable on current regimen.           No orders of the defined types were placed in this encounter.   Follow-up: Return in about 6 months (around 07/13/2021) for Hypertension.    Beatrice Lecher, MD

## 2021-01-10 NOTE — Assessment & Plan Note (Signed)
Discussed continuing baby aspirin daily with warning signs and symptoms for side effects of chronic aspirin use.  She says she tolerates that well

## 2021-01-10 NOTE — Assessment & Plan Note (Addendum)
Due to recheck renal function.  Continue to keep blood pressure well controlled.

## 2021-01-10 NOTE — Assessment & Plan Note (Signed)
Stable on current regimen   

## 2021-01-10 NOTE — Assessment & Plan Note (Signed)
Well-controlled off of Ambien now using melatonin.

## 2021-01-10 NOTE — Assessment & Plan Note (Addendum)
Well controlled on triple therapy. Continue current regimen. Follow up in  6 mo

## 2021-01-11 ENCOUNTER — Other Ambulatory Visit: Payer: Self-pay | Admitting: *Deleted

## 2021-01-11 ENCOUNTER — Other Ambulatory Visit: Payer: Self-pay | Admitting: Family Medicine

## 2021-01-11 DIAGNOSIS — E785 Hyperlipidemia, unspecified: Secondary | ICD-10-CM

## 2021-01-11 DIAGNOSIS — I10 Essential (primary) hypertension: Secondary | ICD-10-CM

## 2021-01-11 LAB — LIPID PANEL
Cholesterol: 251 mg/dL — ABNORMAL HIGH (ref <200)
HDL: 73 mg/dL (ref >=50)
LDL Cholesterol (Calc): 147 mg/dL (calc) — ABNORMAL HIGH
Non-HDL Cholesterol (Calc): 178 mg/dL (calc) — ABNORMAL HIGH (ref <130)
Total CHOL/HDL Ratio: 3.4 (calc) (ref <5.0)
Triglycerides: 174 mg/dL — ABNORMAL HIGH (ref <150)

## 2021-01-11 LAB — COMPLETE METABOLIC PANEL WITH GFR
AG Ratio: 1.8 (calc) (ref 1.0–2.5)
ALT: 11 U/L (ref 6–29)
AST: 16 U/L (ref 10–35)
Albumin: 4.6 g/dL (ref 3.6–5.1)
Alkaline phosphatase (APISO): 54 U/L (ref 37–153)
BUN/Creatinine Ratio: 22 (calc) (ref 6–22)
BUN: 22 mg/dL (ref 7–25)
CO2: 29 mmol/L (ref 20–32)
Calcium: 10.2 mg/dL (ref 8.6–10.4)
Chloride: 100 mmol/L (ref 98–110)
Creat: 0.99 mg/dL — ABNORMAL HIGH (ref 0.60–0.88)
GFR, Est African American: 60 mL/min/{1.73_m2} (ref >=60)
GFR, Est Non African American: 52 mL/min/{1.73_m2} — ABNORMAL LOW (ref >=60)
Globulin: 2.5 g/dL (calc) (ref 1.9–3.7)
Glucose, Bld: 100 mg/dL — ABNORMAL HIGH (ref 65–99)
Potassium: 3.8 mmol/L (ref 3.5–5.3)
Sodium: 140 mmol/L (ref 135–146)
Total Bilirubin: 0.5 mg/dL (ref 0.2–1.2)
Total Protein: 7.1 g/dL (ref 6.1–8.1)

## 2021-01-11 LAB — CBC
HCT: 42.3 % (ref 35.0–45.0)
Hemoglobin: 14.2 g/dL (ref 11.7–15.5)
MCH: 29 pg (ref 27.0–33.0)
MCHC: 33.6 g/dL (ref 32.0–36.0)
MCV: 86.5 fL (ref 80.0–100.0)
MPV: 10 fL (ref 7.5–12.5)
Platelets: 260 10*3/uL (ref 140–400)
RBC: 4.89 10*6/uL (ref 3.80–5.10)
RDW: 13.8 % (ref 11.0–15.0)
WBC: 6.7 10*3/uL (ref 3.8–10.8)

## 2021-01-11 MED ORDER — PRAVASTATIN SODIUM 20 MG PO TABS: 20.0000 mg | ORAL_TABLET | Freq: Every day | ORAL | 3 refills | Status: DC

## 2021-01-11 MED ORDER — LOSARTAN POTASSIUM-HCTZ 100-25 MG PO TABS: 1.0000 | ORAL_TABLET | Freq: Every day | ORAL | 1 refills | Status: DC

## 2021-01-11 MED ORDER — METOPROLOL SUCCINATE ER 25 MG PO TB24: 25.0000 mg | ORAL_TABLET | Freq: Every day | ORAL | 1 refills | Status: DC

## 2021-01-19 ENCOUNTER — Ambulatory Visit (INDEPENDENT_AMBULATORY_CARE_PROVIDER_SITE_OTHER): Payer: Medicare HMO | Admitting: Medical-Surgical

## 2021-01-19 ENCOUNTER — Other Ambulatory Visit: Payer: Self-pay

## 2021-01-19 DIAGNOSIS — Z780 Asymptomatic menopausal state: Secondary | ICD-10-CM

## 2021-01-19 DIAGNOSIS — Z0000 Encounter for general adult medical examination without abnormal findings: Secondary | ICD-10-CM

## 2021-01-19 NOTE — Progress Notes (Signed)
MEDICARE ANNUAL WELLNESS VISIT  01/19/2021  Telephone Visit Disclaimer This Medicare AWV was conducted by telephone due to national recommendations for restrictions regarding the COVID-19 Pandemic (e.g. social distancing).  I verified, using two identifiers, that I am speaking with Belinda Owens or their authorized healthcare agent. I discussed the limitations, risks, security, and privacy concerns of performing an evaluation and management service by telephone and the potential availability of an in-person appointment in the future. The patient expressed understanding and agreed to proceed.  Location of Patient: Home Location of Provider (nurse):  In the office.  Subjective:    Belinda Owens is a 87 y.o. female patient of Metheney, Rene Kocher, MD who had a Medicare Annual Wellness Visit today via telephone. Belinda Owens is Retired and lives alone. she has 3 children (one has deceased). she reports that she is socially active and does interact with friends/family regularly. she is minimally physically active and enjoys playing the piano and doing crossword puzzles.  Patient Care Team: Hali Marry, MD as PCP - General (Family Medicine)  Advanced Directives 01/19/2021 07/29/2019 03/13/2017 01/04/2017 08/19/2015 03/25/2015  Does Patient Have a Medical Advance Directive? No No No No No No  Would patient like information on creating a medical advance directive? No - Patient declined No - Patient declined No - Patient declined Yes (ED - Information included in AVS) No - patient declined information No - patient declined information    Hospital Utilization Over the Past 12 Months: # of hospitalizations or ER visits: 0 # of surgeries: 0  Review of Systems    Patient reports that her overall health is unchanged compared to last year.  History obtained from chart review and the patient  Patient Reported Readings (BP, Pulse, CBG, Weight, etc) none  Pain Assessment Pain : No/denies pain      Current Medications & Allergies (verified) Allergies as of 01/19/2021      Reactions   Atenolol Other (See Comments), Rash   Headache Headache   Atorvastatin Other (See Comments)   Myalgia   Fluoxetine Other (See Comments), Rash   Tearfulness   Omeprazole Itching   Tylenol With Codeine #3  [acetaminophen-codeine] Nausea And Vomiting      Medication List       Accurate as of January 19, 2021 10:13 AM. If you have any questions, ask your nurse or doctor.        albuterol 108 (90 Base) MCG/ACT inhaler Commonly known as: VENTOLIN HFA Inhale 2 puffs into the lungs every 6 (six) hours as needed for wheezing or shortness of breath.   amLODipine 5 MG tablet Commonly known as: NORVASC Take 1 tablet (5 mg total) by mouth daily.   aspirin 81 MG tablet Take 81 mg by mouth daily.   losartan-hydrochlorothiazide 100-25 MG tablet Commonly known as: HYZAAR Take 1 tablet by mouth daily.   metoprolol succinate 25 MG 24 hr tablet Commonly known as: TOPROL-XL Take 1 tablet (25 mg total) by mouth daily.   pravastatin 20 MG tablet Commonly known as: PRAVACHOL Take 1 tablet (20 mg total) by mouth at bedtime.   sertraline 25 MG tablet Commonly known as: ZOLOFT TAKE ONE TABLET BY MOUTH DAILY What changed:   how much to take  how to take this  when to take this  additional instructions       History (reviewed): Past Medical History:  Diagnosis Date  . Fibrocystic breast changes of both breasts    Gets yearly mammo and [place]  .  History of rheumatic fever    Past Surgical History:  Procedure Laterality Date  . BREAST BIOPSY    . MENISCUS REPAIR Right   . RECONSTRUCTION OF EYELID Bilateral 12/14/2016  . ROTATOR CUFF REPAIR Right    Family History  Problem Relation Age of Onset  . Breast cancer Maternal Aunt   . Colon cancer Father   . Asthma Daughter   . Heart disease Mother   . Uterine cancer Mother   . Cancer - Other Brother    Social History   Socioeconomic  History  . Marital status: Divorced    Spouse name: Not on file  . Number of children: 3  . Years of education: 70  . Highest education level: Some college, no degree  Occupational History  . Occupation: retired.     Comment: [employer]  Tobacco Use  . Smoking status: Former Smoker    Types: Cigarettes    Quit date: 10/23/1957    Years since quitting: 63.2  . Smokeless tobacco: Never Used  Vaping Use  . Vaping Use: Never used  Substance and Sexual Activity  . Alcohol use: Not Currently    Alcohol/week: 0.0 standard drinks  . Drug use: No  . Sexual activity: Never    Partners: Male    Birth control/protection: None  Other Topics Concern  . Not on file  Social History Narrative   Drinks decaf coffee.  Quit smoking 1959. Lives alone. Had three children, one has deceased.   Social Determinants of Health   Financial Resource Strain: Low Risk   . Difficulty of Paying Living Expenses: Not hard at all  Food Insecurity: No Food Insecurity  . Worried About Charity fundraiser in the Last Year: Never true  . Ran Out of Food in the Last Year: Never true  Transportation Needs: No Transportation Needs  . Lack of Transportation (Medical): No  . Lack of Transportation (Non-Medical): No  Physical Activity: Inactive  . Days of Exercise per Week: 0 days  . Minutes of Exercise per Session: 0 min  Stress: No Stress Concern Present  . Feeling of Stress : Not at all  Social Connections: Moderately Integrated  . Frequency of Communication with Friends and Family: More than three times a week  . Frequency of Social Gatherings with Friends and Family: Twice a week  . Attends Religious Services: More than 4 times per year  . Active Member of Clubs or Organizations: Yes  . Attends Archivist Meetings: More than 4 times per year  . Marital Status: Divorced    Activities of Daily Living In your present state of health, do you have any difficulty performing the following  activities: 01/19/2021  Hearing? N  Vision? N  Difficulty concentrating or making decisions? N  Walking or climbing stairs? N  Dressing or bathing? N  Doing errands, shopping? N  Preparing Food and eating ? N  Using the Toilet? N  In the past six months, have you accidently leaked urine? N  Do you have problems with loss of bowel control? N  Managing your Medications? N  Managing your Finances? N  Housekeeping or managing your Housekeeping? N  Some recent data might be hidden    Patient Education/ Literacy How often do you need to have someone help you when you read instructions, pamphlets, or other written materials from your doctor or pharmacy?: 1 - Never What is the last grade level you completed in school?: 12th grade  Exercise Current  Exercise Habits: The patient does not participate in regular exercise at present, Exercise limited by: None identified  Diet Patient reports consuming 2 meals a day and 1-2 snack(s) a day Patient reports that her primary diet is: Regular Patient reports that she does have regular access to food.   Depression Screen PHQ 2/9 Scores 01/19/2021 01/10/2021 07/13/2020 01/07/2020 07/29/2019 07/15/2019 01/16/2019  PHQ - 2 Score 0 0 0 0 0 0 0  PHQ- 9 Score - - 0 1 - - -     Fall Risk Fall Risk  01/19/2021 01/10/2021 07/29/2019 07/15/2019 05/22/2019  Falls in the past year? 0 0 0 0 (No Data)  Comment - - - - Emmi Telephone Survey: data to providers prior to load  Number falls in past yr: 0 - 0 0 (No Data)  Comment - - - - Emmi Telephone Survey Actual Response =   Injury with Fall? 0 - 0 0 -  Risk for fall due to : No Fall Risks No Fall Risks - - -  Follow up Falls evaluation completed - Falls prevention discussed - -     Objective:  Belinda Owens seemed alert and oriented and she participated appropriately during our telephone visit.  Blood Pressure Weight BMI  BP Readings from Last 3 Encounters:  01/10/21 138/63  07/13/20 133/69  04/16/20 (!) 144/83    Wt Readings from Last 3 Encounters:  01/10/21 171 lb (77.6 kg)  07/13/20 168 lb (76.2 kg)  01/07/20 171 lb (77.6 kg)   BMI Readings from Last 1 Encounters:  01/10/21 29.35 kg/m    *Unable to obtain current vital signs, weight, and BMI due to telephone visit type  Hearing/Vision  . Ilayda did not seem to have difficulty with hearing/understanding during the telephone conversation . Reports that she has not had a formal eye exam by an eye care professional within the past year . Reports that she has not had a formal hearing evaluation within the past year *Unable to fully assess hearing and vision during telephone visit type  Cognitive Function: 6CIT Screen 01/19/2021 07/29/2019  What Year? 0 points 0 points  What month? 0 points 0 points  What time? 0 points 0 points  Count back from 20 0 points 0 points  Months in reverse 0 points 0 points  Repeat phrase 0 points 0 points  Total Score 0 0   (Normal:0-7, Significant for Dysfunction: >8)  Normal Cognitive Function Screening: Yes   Immunization & Health Maintenance Record Immunization History  Administered Date(s) Administered  . Fluad Quad(high Dose 65+) 07/15/2019  . Influenza, High Dose Seasonal PF 07/16/2017, 07/17/2018  . Influenza,inj,Quad PF,6+ Mos 08/05/2015, 07/06/2016  . Influenza-Unspecified 07/15/2020  . Moderna Sars-Covid-2 Vaccination 01/01/2020, 02/03/2020  . PFIZER(Purple Top)SARS-COV-2 Vaccination 07/15/2020  . Pneumococcal Conjugate-13 08/19/2015  . Pneumococcal Polysaccharide-23 01/04/2017  . Tdap 03/23/2014    Health Maintenance  Topic Date Due  . TETANUS/TDAP  03/23/2024  . INFLUENZA VACCINE  Completed  . DEXA SCAN  Completed  . COVID-19 Vaccine  Completed  . PNA vac Low Risk Adult  Completed  . HPV VACCINES  Aged Out       Assessment  This is a routine wellness examination for [facility].  Health Maintenance: Due or Overdue There are no preventive care reminders to display for this  patient.  Belinda Owens does not need a referral for [employer] Assistance: Care Management:   no Social Work:    no Prescription Assistance:  no Nutrition/Diabetes Education:  no  Plan:  Personalized Goals Goals Addressed              This Visit's Progress   .  Patient Stated (pt-stated)        01/19/2021 AWV Goal: Exercise for General Health   Patient will verbalize understanding of the benefits of increased physical activity:  Exercising regularly is important. It will improve your overall fitness, flexibility, and endurance.  Regular exercise also will improve your overall health. It can help you control your weight, reduce stress, and improve your bone density.  Over the next year, patient will increase physical activity as tolerated with a goal of at least 150 minutes of moderate physical activity per week.   You can tell that you are exercising at a moderate intensity if your heart starts beating faster and you start breathing faster but can still hold a conversation.  Moderate-intensity exercise ideas include:  Walking 1 mile (1.6 km) in about 15 minutes  Biking  Hiking  Golfing  Dancing  Water aerobics  Patient will verbalize understanding of everyday activities that increase physical activity by providing examples like the following: ? Yard work, such as: ? Pushing a Conservation officer, nature ? Raking and bagging leaves ? Washing your car ? Pushing a stroller ? Shoveling snow ? Gardening ? Washing windows or floors  Patient will be able to explain general safety guidelines for exercising:   Before you start a new exercise program, talk with your health care provider.  Do not exercise so much that you hurt yourself, feel dizzy, or get very short of breath.  Wear comfortable clothes and wear shoes with good support.  Drink plenty of water while you exercise to prevent dehydration or heat stroke.  Work out until your breathing and your heartbeat get faster.        Personalized Health Maintenance & Screening Recommendations  Shingles vaccine and Dexa scan (after 08/13/21).  Lung Cancer Screening Recommended: no (Low Dose CT Chest recommended if Age 47-80 years, 30 pack-year currently smoking OR have quit w/in past 15 years) Hepatitis C Screening recommended: no HIV Screening recommended: no  Advanced Directives: Written information was not prepared per patient's request.  Referrals & Orders Orders Placed This Encounter  Procedures  . Comfort    Follow-up Plan . Follow-up with Hali Marry, MD as planned . Schedule your shingles vaccine at your pharmacy.  . Dexa scan referral has been sent and they will call you to schedule. . Medicare wellness in one year.   I have personally reviewed and noted the following in the patient's chart:   . Medical and social history . Use of alcohol, tobacco or illicit drugs  . Current medications and supplements . Functional ability and status . Nutritional status . Physical activity . Advanced directives . List of other physicians . Hospitalizations, surgeries, and ER visits in previous 12 months . Vitals . Screenings to include cognitive, depression, and falls . Referrals and appointments  In addition, I have reviewed and discussed with Belinda Owens certain preventive protocols, quality metrics, and best practice recommendations. A written personalized care plan for preventive services as well as general preventive health recommendations is available and can be mailed to the patient at her request.      Tinnie Gens, RN  01/19/2021

## 2021-01-19 NOTE — Patient Instructions (Addendum)
[facility] Maintenance Summary and Written Plan of Care  [name] ,  Thank you for allowing me to perform your Medicare Annual Wellness Visit and for your ongoing commitment to your health.   Health Maintenance & Immunization History Health Maintenance  Topic Date Due  . TETANUS/TDAP  03/23/2024  . INFLUENZA VACCINE  Completed  . DEXA SCAN  Completed  . COVID-19 Vaccine  Completed  . PNA vac Low Risk Adult  Completed  . HPV VACCINES  Aged Out   Immunization History  Administered Date(s) Administered  . Fluad Quad(high Dose 65+) 07/15/2019  . Influenza, High Dose Seasonal PF 07/16/2017, 07/17/2018  . Influenza,inj,Quad PF,6+ Mos 08/05/2015, 07/06/2016  . Influenza-Unspecified 07/15/2020  . Moderna Sars-Covid-2 Vaccination 01/01/2020, 02/03/2020  . PFIZER(Purple Top)SARS-COV-2 Vaccination 07/15/2020  . Pneumococcal Conjugate-13 08/19/2015  . Pneumococcal Polysaccharide-23 01/04/2017  . Tdap 03/23/2014    These are the patient goals that we discussed: Goals Addressed              This Visit's Progress   .  Patient Stated (pt-stated)        01/19/2021 AWV Goal: Exercise for General Health   Patient will verbalize understanding of the benefits of increased physical activity:  Exercising regularly is important. It will improve your overall fitness, flexibility, and endurance.  Regular exercise also will improve your overall health. It can help you control your weight, reduce stress, and improve your bone density.  Over the next year, patient will increase physical activity as tolerated with a goal of at least 150 minutes of moderate physical activity per week.   You can tell that you are exercising at a moderate intensity if your heart starts beating faster and you start breathing faster but can still hold a conversation.  Moderate-intensity exercise ideas include:  Walking 1 mile (1.6 km) in about 15  minutes  Biking  Hiking  Golfing  Dancing  Water aerobics  Patient will verbalize understanding of everyday activities that increase physical activity by providing examples like the following: ? Yard work, such as: ? Pushing a Conservation officer, nature ? Raking and bagging leaves ? Washing your car ? Pushing a stroller ? Shoveling snow ? Gardening ? Washing windows or floors  Patient will be able to explain general safety guidelines for exercising:   Before you start a new exercise program, talk with your health care provider.  Do not exercise so much that you hurt yourself, feel dizzy, or get very short of breath.  Wear comfortable clothes and wear shoes with good support.  Drink plenty of water while you exercise to prevent dehydration or heat stroke.  Work out until your breathing and your heartbeat get faster.         This is a list of Health Maintenance Items that are overdue or due now: Shingles vaccine and Dexa scan (after 08/13/21).  Orders/Referrals Placed Today: Orders Placed This Encounter  Procedures  . DEXAScan    Standing Status:   Future    Standing Expiration Date:   01/19/2022    Scheduling Instructions:     Please call patient to schedule. She will be due for Dexa scan after 08/13/21.    Order Specific Question:   Reason for exam:    Answer:   Post Menopausal    Order Specific Question:   Preferred imaging location?    Answer:   [facility]   (Contact our referral department at [phone] if you have not spoken with someone  about your referral appointment within the next 5 days)    Follow-up Plan . Follow-up with Hali Marry, MD as planned . Schedule your shingles vaccine at your pharmacy.  . Dexa scan referral has been sent and they will call you to schedule. . Medicare wellness in one year.    Health Maintenance, Female Adopting a healthy lifestyle and getting preventive care are important in promoting health and wellness.  Ask your health care provider about:  The right schedule for you to have regular tests and exams.  Things you can do on your own to prevent diseases and keep yourself healthy. What should I know about diet, weight, and exercise? Eat a healthy diet  Eat a diet that includes plenty of vegetables, fruits, low-fat dairy products, and lean protein.  Do not eat a lot of foods that are high in solid fats, added sugars, or sodium.   Maintain a healthy weight Body mass index (BMI) is used to identify weight problems. It estimates body fat based on height and weight. Your health care provider can help determine your BMI and help you achieve or maintain a healthy weight. Get regular exercise Get regular exercise. This is one of the most important things you can do for your health. Most adults should:  Exercise for at least 150 minutes each week. The exercise should increase your heart rate and make you sweat (moderate-intensity exercise).  Do strengthening exercises at least twice a week. This is in addition to the moderate-intensity exercise.  Spend less time sitting. Even light physical activity can be beneficial. Watch cholesterol and blood lipids Have your blood tested for lipids and cholesterol at 85 years of age, then have this test every 5 years. Have your cholesterol levels checked more often if:  Your lipid or cholesterol levels are high.  You are older than 85 years of age.  You are at high risk for heart disease. What should I know about cancer screening? Depending on your health history and family history, you may need to have cancer screening at various ages. This may include screening for:  Breast cancer.  Cervical cancer.  Colorectal cancer.  Skin cancer.  Lung cancer. What should I know about heart disease, diabetes, and high blood pressure? Blood pressure and heart disease  High blood pressure causes heart disease and increases the risk of stroke. This is more  likely to develop in people who have high blood pressure readings, are of African descent, or are overweight.  Have your blood pressure checked: ? Every 3-5 years if you are 28-60 years of age. ? Every year if you are 81 years old or older. Diabetes Have regular diabetes screenings. This checks your fasting blood sugar level. Have the screening done:  Once every three years after age 31 if you are at a normal weight and have a low risk for diabetes.  More often and at a younger age if you are overweight or have a high risk for diabetes. What should I know about preventing infection? Hepatitis B If you have a higher risk for hepatitis B, you should be screened for this virus. Talk with your health care provider to find out if you are at risk for hepatitis B infection. Hepatitis C Testing is recommended for:  Everyone born from [birth year] through 1963.  Anyone with known risk factors for hepatitis C. Sexually transmitted infections (STIs)  Get screened for STIs, including gonorrhea and chlamydia, if: ? You are sexually active and are younger than  85 years of age. ? You are older than 85 years of age and your health care provider tells you that you are at risk for this type of infection. ? Your sexual activity has changed since you were last screened, and you are at increased risk for chlamydia or gonorrhea. Ask your health care provider if you are at risk.  Ask your health care provider about whether you are at high risk for HIV. Your health care provider may recommend a prescription medicine to help prevent HIV infection. If you choose to take medicine to prevent HIV, you should first get tested for HIV. You should then be tested every 3 months for as long as you are taking the medicine. Pregnancy  If you are about to stop having your period (premenopausal) and you may become pregnant, seek counseling before you get pregnant.  Take 400 to 800 micrograms (mcg) of folic acid every day if you  become pregnant.  Ask for birth control (contraception) if you want to prevent pregnancy. Osteoporosis and menopause Osteoporosis is a disease in which the bones lose minerals and strength with aging. This can result in bone fractures. If you are 30 years old or older, or if you are at risk for osteoporosis and fractures, ask your health care provider if you should:  Be screened for bone loss.  Take a calcium or vitamin D supplement to lower your risk of fractures.  Be given hormone replacement therapy (HRT) to treat symptoms of menopause. Follow these instructions at home: Lifestyle  Do not use any products that contain nicotine or tobacco, such as cigarettes, e-cigarettes, and chewing tobacco. If you need help quitting, ask your health care provider.  Do not use street drugs.  Do not share needles.  Ask your health care provider for help if you need support or information about quitting drugs. Alcohol use  Do not drink alcohol if: ? Your health care provider tells you not to drink. ? You are pregnant, may be pregnant, or are planning to become pregnant.  If you drink alcohol: ? Limit how much you use to 0-1 drink a day. ? Limit intake if you are breastfeeding.  Be aware of how much alcohol is in your drink. In the [place] one drink equals one 12 oz bottle of beer (355 mL), one 5 oz glass of wine (148 mL), or one 1 oz glass of hard liquor (44 mL). General instructions  Schedule regular health, dental, and eye exams.  Stay current with your vaccines.  Tell your health care provider if: ? You often feel depressed. ? You have ever been abused or do not feel safe at home. Summary  Adopting a healthy lifestyle and getting preventive care are important in promoting health and wellness.  Follow your health care provider's instructions about healthy diet, exercising, and getting tested or screened for diseases.  Follow your health care provider's instructions on monitoring your  cholesterol and blood pressure. This information is not intended to replace advice given to you by your health care provider. Make sure you discuss any questions you have with your health care provider. Document Revised: 10/02/2018 Document Reviewed: 10/02/2018 Elsevier Patient Education  2021 [employer].

## 2021-01-28 ENCOUNTER — Other Ambulatory Visit: Payer: Self-pay | Admitting: Family Medicine

## 2021-03-03 ENCOUNTER — Encounter: Payer: Self-pay | Admitting: Family Medicine

## 2021-03-03 ENCOUNTER — Telehealth (INDEPENDENT_AMBULATORY_CARE_PROVIDER_SITE_OTHER): Payer: Medicare HMO | Admitting: Family Medicine

## 2021-03-03 DIAGNOSIS — J069 Acute upper respiratory infection, unspecified: Secondary | ICD-10-CM

## 2021-03-03 NOTE — Progress Notes (Signed)
Virtual Visit via Telephone Note  I connected with  Belinda Owens on 03/03/21 at 11:10 AM EDT by telephone and verified that I am speaking with the correct person using two identifiers.   I discussed the limitations, risks, security and privacy concerns of performing an evaluation and management service by telephone and the availability of in person appointments. I also discussed with the patient that there may be a patient responsible charge related to this service. The patient expressed understanding and agreed to proceed.  Participating parties included in this telephone visit include: The patient and the nurse practitioner listed.  The patient is: At home I am: In the office  Subjective:    CC: URI   HPI: Belinda Owens is a 87 y.o. year old female presenting today via telephone visit to discuss URI symptoms.  Patient states that about 5 days ago she was sitting under an air conditioning vent in a small room and thinks she developed a sinus infection. She was having sinus pressure, nasal congestion, headache. She has been taking Allegra D and OTC analgesics and states she feels like she is at the end of it and feeling much better. She has not had any chest pain, shortness of breath, wheezing, GI symptoms, loss of taste/smell, fatigue, body aches, fevers.    Past medical history, Surgical history, Family history not pertinant except as noted below, Social history, Allergies, and medications have been entered into the medical record, reviewed, and corrections made.   Review of Systems:  All review of systems negative except what is listed in the HPI  Objective:    General:  Patient speaking clearly in complete sentences. No shortness of breath noted.   Alert and oriented x3.   Normal judgment.  No apparent acute distress.  Impression and Recommendations:    1. Viral URI Patient already feeling almost back to normal. Recommend she continue conservative measures. She is aware of  signs and symptoms requiring further evaluation.  Follow-up if symptoms worsen.   I discussed the assessment and treatment plan with the patient. The patient was provided an opportunity to ask questions and all were answered. The patient agreed with the plan and demonstrated an understanding of the instructions.   The patient was advised to call back or seek an in-person evaluation if the symptoms worsen or if the condition fails to improve as anticipated.  I provided 20 minutes of non-face-to-face time during this TELEPHONE encounter.    Terrilyn Saver, NP

## 2021-03-03 NOTE — Patient Instructions (Addendum)
Glad you are feeling better already! Don't hesitate to reach out to [place] if you need anything!

## 2021-03-08 ENCOUNTER — Encounter: Payer: Self-pay | Admitting: Family Medicine

## 2021-03-08 ENCOUNTER — Ambulatory Visit (INDEPENDENT_AMBULATORY_CARE_PROVIDER_SITE_OTHER): Payer: Medicare HMO | Admitting: Family Medicine

## 2021-03-08 ENCOUNTER — Other Ambulatory Visit: Payer: Self-pay

## 2021-03-08 DIAGNOSIS — G43909 Migraine, unspecified, not intractable, without status migrainosus: Secondary | ICD-10-CM | POA: Insufficient documentation

## 2021-03-08 DIAGNOSIS — G43009 Migraine without aura, not intractable, without status migrainosus: Secondary | ICD-10-CM

## 2021-03-08 MED ORDER — PREDNISONE 10 MG PO TABS: ORAL_TABLET | ORAL | 0 refills | Status: AC

## 2021-03-08 NOTE — Progress Notes (Signed)
Acute Office Visit  Subjective:    Patient ID: Belinda Owens, female    DOB: 22-Apr-1932, 87 y.o.   MRN: [id number]  Chief Complaint  Patient presents with  . Sinusitis    HPI Patient is in today for sinus congestion and Headache- started about 1.5 weeks ago after sitting under a cold vent for a couple of hours.  No SOB. No nasal congestion.  No cough.  Had a video visit on 5/12 and dx with viral URI, now HA is better.  She is mostly intense pressure.  Hx of seasonal allergies. Uses allegra D PRN.  Used Affrin a couple of times in the last week.    Past Medical History:  Diagnosis Date  . Fibrocystic breast changes of both breasts    Gets yearly mammo and [place]  . History of rheumatic fever     Past Surgical History:  Procedure Laterality Date  . BREAST BIOPSY    . MENISCUS REPAIR Right   . RECONSTRUCTION OF EYELID Bilateral 12/14/2016  . ROTATOR CUFF REPAIR Right     Family History  Problem Relation Age of Onset  . Breast cancer Maternal Aunt   . Colon cancer Father   . Asthma Daughter   . Heart disease Mother   . Uterine cancer Mother   . Cancer - Other Brother     Social History   Socioeconomic History  . Marital status: Divorced    Spouse name: Not on file  . Number of children: 3  . Years of education: 109  . Highest education level: Some college, no degree  Occupational History  . Occupation: retired.     Comment: [employer]  Tobacco Use  . Smoking status: Former Smoker    Types: Cigarettes    Quit date: 10/23/1957    Years since quitting: 63.4  . Smokeless tobacco: Never Used  Vaping Use  . Vaping Use: Never used  Substance and Sexual Activity  . Alcohol use: Not Currently    Alcohol/week: 0.0 standard drinks  . Drug use: No  . Sexual activity: Never    Partners: Male    Birth control/protection: None  Other Topics Concern  . Not on file  Social History Narrative   Drinks decaf coffee.  Quit smoking 1959. Lives alone. Had three children, one  has deceased.   Social Determinants of Health   Financial Resource Strain: Low Risk   . Difficulty of Paying Living Expenses: Not hard at all  Food Insecurity: No Food Insecurity  . Worried About Charity fundraiser in the Last Year: Never true  . Ran Out of Food in the Last Year: Never true  Transportation Needs: No Transportation Needs  . Lack of Transportation (Medical): No  . Lack of Transportation (Non-Medical): No  Physical Activity: Inactive  . Days of Exercise per Week: 0 days  . Minutes of Exercise per Session: 0 min  Stress: No Stress Concern Present  . Feeling of Stress : Not at all  Social Connections: Moderately Integrated  . Frequency of Communication with Friends and Family: More than three times a week  . Frequency of Social Gatherings with Friends and Family: Twice a week  . Attends Religious Services: More than 4 times per year  . Active Member of Clubs or Organizations: Yes  . Attends Archivist Meetings: More than 4 times per year  . Marital Status: Divorced  Human resources officer Violence: Not At Risk  . Fear of Current or Ex-Partner: No  .  Emotionally Abused: No  . Physically Abused: No  . Sexually Abused: No    Outpatient Medications Prior to Visit  Medication Sig Dispense Refill  . albuterol (PROVENTIL HFA;VENTOLIN HFA) 108 (90 Base) MCG/ACT inhaler Inhale 2 puffs into the lungs every 6 (six) hours as needed for wheezing or shortness of breath. 18 g 1  . amLODipine (NORVASC) 5 MG tablet Take 1 tablet (5 mg total) by mouth daily. 90 tablet 1  . aspirin 81 MG tablet Take 81 mg by mouth daily.    Marland Kitchen losartan-hydrochlorothiazide (HYZAAR) 100-25 MG tablet Take 1 tablet by mouth daily. 90 tablet 1  . metoprolol succinate (TOPROL-XL) 25 MG 24 hr tablet Take 1 tablet (25 mg total) by mouth daily. 90 tablet 1  . pravastatin (PRAVACHOL) 20 MG tablet Take 1 tablet (20 mg total) by mouth at bedtime. 90 tablet 3  . sertraline (ZOLOFT) 25 MG tablet TAKE ONE TABLET  BY MOUTH DAILY (Patient taking differently: Some days she takes only a half tablet) 90 tablet 1  . zolpidem (AMBIEN) 5 MG tablet TAKE ONE TABLET BY MOUTH EVERY NIGHT AT BEDTIME AS NEEDED FOR SLEEP 30 tablet 0   No facility-administered medications prior to visit.    Allergies  Allergen Reactions  . Atenolol Other (See Comments) and Rash    Headache Headache  . Atorvastatin Other (See Comments)    Myalgia  . Fluoxetine Other (See Comments) and Rash    Tearfulness  . Omeprazole Itching  . Tylenol With Codeine #3  [Acetaminophen-Codeine] Nausea And Vomiting    Review of Systems     Objective:    Physical Exam Constitutional:      Appearance: She is well-developed.  HENT:     Head: Normocephalic and atraumatic.     Comments: nontender over frontal sinuses    Right Ear: External ear normal.     Left Ear: External ear normal.     Nose: Nose normal.  Eyes:     Conjunctiva/sclera: Conjunctivae normal.     Pupils: Pupils are equal, round, and reactive to light.  Neck:     Thyroid: No thyromegaly.  Cardiovascular:     Rate and Rhythm: Normal rate and regular rhythm.     Heart sounds: Normal heart sounds.  Pulmonary:     Effort: Pulmonary effort is normal.     Breath sounds: Normal breath sounds. No wheezing.  Musculoskeletal:     Cervical back: Neck supple.  Lymphadenopathy:     Cervical: No cervical adenopathy.  Skin:    General: Skin is warm and dry.  Neurological:     Mental Status: She is alert and oriented to person, place, and time.     BP 134/66   Pulse 61   Wt 167 lb (75.8 kg)   SpO2 97%   BMI 28.67 kg/m  Wt Readings from Last 3 Encounters:  03/08/21 167 lb (75.8 kg)  01/10/21 171 lb (77.6 kg)  07/13/20 168 lb (76.2 kg)    There are no preventive care reminders to display for this patient.  There are no preventive care reminders to display for this patient.   Lab Results  Component Value Date   TSH 2.43 02/19/2018   Lab Results  Component  Value Date   WBC 6.7 01/10/2021   HGB 14.2 01/10/2021   HCT 42.3 01/10/2021   MCV 86.5 01/10/2021   PLT 260 01/10/2021   Lab Results  Component Value Date   NA 140 01/10/2021   K 3.8  01/10/2021   CO2 29 01/10/2021   GLUCOSE 100 (H) 01/10/2021   BUN 22 01/10/2021   CREATININE 0.99 (H) 01/10/2021   BILITOT 0.5 01/10/2021   ALKPHOS 56 07/06/2016   AST 16 01/10/2021   ALT 11 01/10/2021   PROT 7.1 01/10/2021   ALBUMIN 4.3 07/06/2016   CALCIUM 10.2 01/10/2021   Lab Results  Component Value Date   CHOL 251 (H) 01/10/2021   Lab Results  Component Value Date   HDL 73 01/10/2021   Lab Results  Component Value Date   LDLCALC 147 (H) 01/10/2021   Lab Results  Component Value Date   TRIG 174 (H) 01/10/2021   Lab Results  Component Value Date   CHOLHDL 3.4 01/10/2021   No results found for: HGBA1C     Assessment & Plan:   Problem List Items Addressed This Visit      Cardiovascular and Mediastinum   Migraine     Migraine Headache -she does have some element of seasonal allergy but no indication of full-blown sinus infection.  We will treat with prednisone taper.  Call if any problems or concerns she has used it before is just been a couple years.  If the headache does not resolve after the prednisone taper then please let us know.  Meds ordered this encounter  Medications  . predniSONE (DELTASONE) 10 MG tablet    Sig: Take 8 tablets (80 mg total) by mouth daily with breakfast for 1 day, THEN 6 tablets (60 mg total) daily with breakfast for 1 day, THEN 4 tablets (40 mg total) daily with breakfast for 1 day, THEN 2 tablets (20 mg total) daily with breakfast for 1 day, THEN 1 tablet (10 mg total) daily with breakfast for 1 day.    Dispense:  21 tablet    Refill:  0     Beatrice Lecher, MD

## 2021-03-16 ENCOUNTER — Ambulatory Visit (INDEPENDENT_AMBULATORY_CARE_PROVIDER_SITE_OTHER): Payer: Medicare HMO | Admitting: Family Medicine

## 2021-03-16 ENCOUNTER — Encounter: Payer: Self-pay | Admitting: Family Medicine

## 2021-03-16 VITALS — BP 133/63 | HR 83 | Ht 64.0 in | Wt 167.0 lb

## 2021-03-16 DIAGNOSIS — F5101 Primary insomnia: Secondary | ICD-10-CM | POA: Diagnosis not present

## 2021-03-16 DIAGNOSIS — J019 Acute sinusitis, unspecified: Secondary | ICD-10-CM | POA: Diagnosis not present

## 2021-03-16 MED ORDER — ZOLPIDEM TARTRATE 5 MG PO TABS: ORAL_TABLET | ORAL | 1 refills | Status: DC

## 2021-03-16 MED ORDER — DOXYCYCLINE HYCLATE 100 MG PO TABS: 100.0000 mg | ORAL_TABLET | Freq: Two times a day (BID) | ORAL | 0 refills | Status: DC

## 2021-03-16 NOTE — Assessment & Plan Note (Signed)
She would like a refill on her Ambien which she uses occasionally it looks like we just refilled it last month but she says she does not remember picking it up.RF sent.

## 2021-03-16 NOTE — Progress Notes (Signed)
Acute Office Visit  Subjective:    Patient ID: Belinda Owens, female    DOB: January 29, 1932, 87 y.o.   MRN: [id number]  No chief complaint on file.   HPI  Patient is in today for sinus pressure.  She was seen on the 17th for headache sinus pressure without congestion we treated her for a migraine with a steroid burst.  She actually just finished up her 5 days of prednisone yesterday and says that she is some better but she still having a lot of pressure right over the mid forehead a little bit worse on the left side it is radiating up towards the top of her head.  She still has not had any significant congestion but now feels like her balance is off.  No fevers or chills.  Past Medical History:  Diagnosis Date  . Fibrocystic breast changes of both breasts    Gets yearly mammo and [place]  . History of rheumatic fever     Past Surgical History:  Procedure Laterality Date  . BREAST BIOPSY    . MENISCUS REPAIR Right   . RECONSTRUCTION OF EYELID Bilateral 12/14/2016  . ROTATOR CUFF REPAIR Right     Family History  Problem Relation Age of Onset  . Breast cancer Maternal Aunt   . Colon cancer Father   . Asthma Daughter   . Heart disease Mother   . Uterine cancer Mother   . Cancer - Other Brother     Social History   Socioeconomic History  . Marital status: Divorced    Spouse name: Not on file  . Number of children: 3  . Years of education: 58  . Highest education level: Some college, no degree  Occupational History  . Occupation: retired.     Comment: [employer]  Tobacco Use  . Smoking status: Former Smoker    Types: Cigarettes    Quit date: 10/23/1957    Years since quitting: 63.4  . Smokeless tobacco: Never Used  Vaping Use  . Vaping Use: Never used  Substance and Sexual Activity  . Alcohol use: Not Currently    Alcohol/week: 0.0 standard drinks  . Drug use: No  . Sexual activity: Never    Partners: Male    Birth control/protection: None  Other Topics Concern   . Not on file  Social History Narrative   Drinks decaf coffee.  Quit smoking 1959. Lives alone. Had three children, one has deceased.   Social Determinants of Health   Financial Resource Strain: Low Risk   . Difficulty of Paying Living Expenses: Not hard at all  Food Insecurity: No Food Insecurity  . Worried About Charity fundraiser in the Last Year: Never true  . Ran Out of Food in the Last Year: Never true  Transportation Needs: No Transportation Needs  . Lack of Transportation (Medical): No  . Lack of Transportation (Non-Medical): No  Physical Activity: Inactive  . Days of Exercise per Week: 0 days  . Minutes of Exercise per Session: 0 min  Stress: No Stress Concern Present  . Feeling of Stress : Not at all  Social Connections: Moderately Integrated  . Frequency of Communication with Friends and Family: More than three times a week  . Frequency of Social Gatherings with Friends and Family: Twice a week  . Attends Religious Services: More than 4 times per year  . Active Member of Clubs or Organizations: Yes  . Attends Archivist Meetings: More than 4 times per year  .  Marital Status: Divorced  Human resources officer Violence: Not At Risk  . Fear of Current or Ex-Partner: No  . Emotionally Abused: No  . Physically Abused: No  . Sexually Abused: No    Outpatient Medications Prior to Visit  Medication Sig Dispense Refill  . albuterol (PROVENTIL HFA;VENTOLIN HFA) 108 (90 Base) MCG/ACT inhaler Inhale 2 puffs into the lungs every 6 (six) hours as needed for wheezing or shortness of breath. 18 g 1  . amLODipine (NORVASC) 5 MG tablet Take 1 tablet (5 mg total) by mouth daily. 90 tablet 1  . aspirin 81 MG tablet Take 81 mg by mouth daily.    Marland Kitchen losartan-hydrochlorothiazide (HYZAAR) 100-25 MG tablet Take 1 tablet by mouth daily. 90 tablet 1  . metoprolol succinate (TOPROL-XL) 25 MG 24 hr tablet Take 1 tablet (25 mg total) by mouth daily. 90 tablet 1  . pravastatin (PRAVACHOL) 20  MG tablet Take 1 tablet (20 mg total) by mouth at bedtime. 90 tablet 3  . zolpidem (AMBIEN) 5 MG tablet TAKE ONE TABLET BY MOUTH EVERY NIGHT AT BEDTIME AS NEEDED FOR SLEEP 30 tablet 0  . sertraline (ZOLOFT) 25 MG tablet TAKE ONE TABLET BY MOUTH DAILY (Patient taking differently: Some days she takes only a half tablet) 90 tablet 1   No facility-administered medications prior to visit.    Allergies  Allergen Reactions  . Atenolol Other (See Comments) and Rash    Headache Headache  . Atorvastatin Other (See Comments)    Myalgia  . Fluoxetine Other (See Comments) and Rash    Tearfulness  . Omeprazole Itching  . Tylenol With Codeine #3  [Acetaminophen-Codeine] Nausea And Vomiting    Review of Systems     Objective:    Physical Exam Constitutional:      Appearance: She is well-developed.  HENT:     Head: Normocephalic and atraumatic.     Right Ear: External ear normal.     Left Ear: External ear normal.     Nose: Nose normal.  Eyes:     Conjunctiva/sclera: Conjunctivae normal.     Pupils: Pupils are equal, round, and reactive to light.  Neck:     Thyroid: No thyromegaly.  Cardiovascular:     Rate and Rhythm: Normal rate and regular rhythm.     Heart sounds: Normal heart sounds.  Pulmonary:     Effort: Pulmonary effort is normal.     Breath sounds: Normal breath sounds. No wheezing.  Musculoskeletal:     Cervical back: Neck supple.  Lymphadenopathy:     Cervical: No cervical adenopathy.  Skin:    General: Skin is warm and dry.  Neurological:     Mental Status: She is alert and oriented to person, place, and time.     BP 133/63   Pulse 83   Ht 5\' 4"  (1.626 m)   Wt 167 lb (75.8 kg)   SpO2 100%   BMI 28.67 kg/m  Wt Readings from Last 3 Encounters:  03/16/21 167 lb (75.8 kg)  03/08/21 167 lb (75.8 kg)  01/10/21 171 lb (77.6 kg)    There are no preventive care reminders to display for this patient.  There are no preventive care reminders to display for this  patient.   Lab Results  Component Value Date   TSH 2.43 02/19/2018   Lab Results  Component Value Date   WBC 6.7 01/10/2021   HGB 14.2 01/10/2021   HCT 42.3 01/10/2021   MCV 86.5 01/10/2021   PLT  260 01/10/2021   Lab Results  Component Value Date   NA 140 01/10/2021   K 3.8 01/10/2021   CO2 29 01/10/2021   GLUCOSE 100 (H) 01/10/2021   BUN 22 01/10/2021   CREATININE 0.99 (H) 01/10/2021   BILITOT 0.5 01/10/2021   ALKPHOS 56 07/06/2016   AST 16 01/10/2021   ALT 11 01/10/2021   PROT 7.1 01/10/2021   ALBUMIN 4.3 07/06/2016   CALCIUM 10.2 01/10/2021   Lab Results  Component Value Date   CHOL 251 (H) 01/10/2021   Lab Results  Component Value Date   HDL 73 01/10/2021   Lab Results  Component Value Date   LDLCALC 147 (H) 01/10/2021   Lab Results  Component Value Date   TRIG 174 (H) 01/10/2021   Lab Results  Component Value Date   CHOLHDL 3.4 01/10/2021   No results found for: HGBA1C     Assessment & Plan:   Problem List Items Addressed This Visit      Other   Insomnia - Primary    She would like a refill on her Ambien which she uses occasionally it looks like we just refilled it last month but she says she does not remember picking it up.RF sent.       Other Visit Diagnoses    Acute non-recurrent sinusitis, unspecified location       Relevant Medications   doxycycline (VIBRA-TABS) 100 MG tablet     Frontal headache without relief after prednisone taper.  Consider it could just still be an uncontrolled migraine versus sinusitis.  Organ to go ahead and treat with doxycycline to see if she is better if not please call us back on Monday and let us know.   Meds ordered this encounter  Medications  . doxycycline (VIBRA-TABS) 100 MG tablet    Sig: Take 1 tablet (100 mg total) by mouth 2 (two) times daily.    Dispense:  14 tablet    Refill:  0  . zolpidem (AMBIEN) 5 MG tablet    Sig: TAKE ONE TABLET BY MOUTH EVERY NIGHT AT BEDTIME AS NEEDED FOR SLEEP     Dispense:  30 tablet    Refill:  1     Beatrice Lecher, MD

## 2021-03-16 NOTE — Progress Notes (Signed)
She finished prednisone yesterday. She stated that when she is sitting she is fine but when she gets up to walk she feels like she is stumbling.   She has pain/pressure across her eyes and forehead.

## 2021-03-29 ENCOUNTER — Other Ambulatory Visit: Payer: Self-pay | Admitting: Family Medicine

## 2021-04-22 ENCOUNTER — Ambulatory Visit (INDEPENDENT_AMBULATORY_CARE_PROVIDER_SITE_OTHER): Payer: Medicare HMO | Admitting: Sports Medicine

## 2021-04-22 ENCOUNTER — Ambulatory Visit (INDEPENDENT_AMBULATORY_CARE_PROVIDER_SITE_OTHER): Payer: Medicare HMO

## 2021-04-22 ENCOUNTER — Other Ambulatory Visit: Payer: Self-pay

## 2021-04-22 DIAGNOSIS — R2242 Localized swelling, mass and lump, left lower limb: Secondary | ICD-10-CM | POA: Diagnosis not present

## 2021-04-22 DIAGNOSIS — I878 Other specified disorders of veins: Secondary | ICD-10-CM | POA: Diagnosis not present

## 2021-04-22 NOTE — Progress Notes (Signed)
    Procedures performed today:    None.  Independent interpretation of notes and tests performed by another provider:   None.  Brief History, Exam, Impression, and Recommendations:    Mass of leg, left This is a pleasant 87 year old female, for the past 5 months she is noted a slowly growing mass on the anterolateral shin, just distal to the joint line. On exam she has a faint barely perceptible fullness without clear defined borders. It has the texture of a lipoma but without clear defined borders and consistent growth we need to ensure this is not a liposarcoma. Adding x-rays, MRI with contrast, renal function. Return to see me to go over MRI results.    ___________________________________________ Gwen Her. Dianah Field, M.D., ABFM., CAQSM. Primary Care and [place] Instructor of [place] [facility]

## 2021-04-22 NOTE — Assessment & Plan Note (Signed)
This is a pleasant 87 year old female, for the past 5 months she is noted a slowly growing mass on the anterolateral shin, just distal to the joint line. On exam she has a faint barely perceptible fullness without clear defined borders. It has the texture of a lipoma but without clear defined borders and consistent growth we need to ensure this is not a liposarcoma. Adding x-rays, MRI with contrast, renal function. Return to see me to go over MRI results.

## 2021-04-23 LAB — BASIC METABOLIC PANEL WITH GFR
BUN: 16 mg/dL (ref 7–25)
CO2: 23 mmol/L (ref 20–32)
Calcium: 10.6 mg/dL — ABNORMAL HIGH (ref 8.6–10.4)
Chloride: 101 mmol/L (ref 98–110)
Creat: 0.83 mg/dL (ref 0.60–0.88)
GFR, Est African American: 74 mL/min/{1.73_m2} (ref >=60)
GFR, Est Non African American: 64 mL/min/{1.73_m2} (ref >=60)
Glucose, Bld: 99 mg/dL (ref 65–99)
Potassium: 3.9 mmol/L (ref 3.5–5.3)
Sodium: 139 mmol/L (ref 135–146)

## 2021-04-28 ENCOUNTER — Telehealth: Payer: Self-pay | Admitting: Family Medicine

## 2021-04-28 NOTE — Chronic Care Management (AMB) (Signed)
  Chronic Care Management   Note  04/28/2021 Name: Belinda Owens MRN: [id number] DOB: 08/13/1932  Belinda Owens is a 87 y.o. year old female who is a primary care patient of Metheney, Rene Kocher, MD. I reached out to [facility] by phone today in response to a referral sent by Ms. Bernadene Person PCP, Hali Marry, MD.   Belinda Owens was given information about Chronic Care Management services today including:  CCM service includes personalized support from designated clinical staff supervised by her physician, including individualized plan of care and coordination with other care providers 24/7 contact phone numbers for assistance for urgent and routine care needs. Service will only be billed when office clinical staff spend 20 minutes or more in a month to coordinate care. Only one practitioner may furnish and bill the service in a calendar month. The patient may stop CCM services at any time (effective at the end of the month) by phone call to the office staff.   Patient agreed to services and verbal consent obtained.   Follow up plan:   Lauretta Grill [employer] Scheduler

## 2021-05-09 ENCOUNTER — Ambulatory Visit (INDEPENDENT_AMBULATORY_CARE_PROVIDER_SITE_OTHER): Payer: Medicare HMO

## 2021-05-09 ENCOUNTER — Other Ambulatory Visit: Payer: Self-pay

## 2021-05-09 ENCOUNTER — Telehealth: Payer: Self-pay | Admitting: Family Medicine

## 2021-05-09 ENCOUNTER — Other Ambulatory Visit: Payer: Self-pay | Admitting: Family Medicine

## 2021-05-09 DIAGNOSIS — S0003XA Contusion of scalp, initial encounter: Secondary | ICD-10-CM | POA: Diagnosis not present

## 2021-05-09 DIAGNOSIS — M1990 Unspecified osteoarthritis, unspecified site: Secondary | ICD-10-CM | POA: Diagnosis not present

## 2021-05-09 DIAGNOSIS — G4700 Insomnia, unspecified: Secondary | ICD-10-CM | POA: Diagnosis not present

## 2021-05-09 DIAGNOSIS — K219 Gastro-esophageal reflux disease without esophagitis: Secondary | ICD-10-CM | POA: Diagnosis not present

## 2021-05-09 DIAGNOSIS — R2242 Localized swelling, mass and lump, left lower limb: Secondary | ICD-10-CM | POA: Diagnosis not present

## 2021-05-09 DIAGNOSIS — G44319 Acute post-traumatic headache, not intractable: Secondary | ICD-10-CM | POA: Diagnosis not present

## 2021-05-09 DIAGNOSIS — Z888 Allergy status to other drugs, medicaments and biological substances status: Secondary | ICD-10-CM | POA: Diagnosis not present

## 2021-05-09 DIAGNOSIS — S0990XA Unspecified injury of head, initial encounter: Secondary | ICD-10-CM | POA: Diagnosis not present

## 2021-05-09 DIAGNOSIS — F32A Depression, unspecified: Secondary | ICD-10-CM | POA: Diagnosis not present

## 2021-05-09 DIAGNOSIS — R519 Headache, unspecified: Secondary | ICD-10-CM | POA: Diagnosis not present

## 2021-05-09 DIAGNOSIS — E785 Hyperlipidemia, unspecified: Secondary | ICD-10-CM | POA: Diagnosis not present

## 2021-05-09 DIAGNOSIS — I10 Essential (primary) hypertension: Secondary | ICD-10-CM | POA: Diagnosis not present

## 2021-05-09 DIAGNOSIS — Z885 Allergy status to narcotic agent status: Secondary | ICD-10-CM | POA: Diagnosis not present

## 2021-05-09 DIAGNOSIS — I8392 Asymptomatic varicose veins of left lower extremity: Secondary | ICD-10-CM | POA: Diagnosis not present

## 2021-05-09 MED ORDER — GADOBUTROL 1 MMOL/ML IV SOLN
7.5000 mL | Freq: Once | INTRAVENOUS | Status: AC | PRN
  Administered 2021-05-09: 7.5 mL via INTRAVENOUS

## 2021-05-09 NOTE — Telephone Encounter (Signed)
Patient hit head and face when she fell. Only wanted to see pcp or Dr.T. Nothing open until next week. Okay to wait?

## 2021-05-10 ENCOUNTER — Ambulatory Visit (INDEPENDENT_AMBULATORY_CARE_PROVIDER_SITE_OTHER): Payer: Medicare HMO | Admitting: Family Medicine

## 2021-05-10 ENCOUNTER — Encounter: Payer: Self-pay | Admitting: Family Medicine

## 2021-05-10 VITALS — BP 150/65 | HR 67 | Ht 64.0 in | Wt 164.0 lb

## 2021-05-10 DIAGNOSIS — J0130 Acute sphenoidal sinusitis, unspecified: Secondary | ICD-10-CM | POA: Diagnosis not present

## 2021-05-10 DIAGNOSIS — S0990XA Unspecified injury of head, initial encounter: Secondary | ICD-10-CM

## 2021-05-10 DIAGNOSIS — F0781 Postconcussional syndrome: Secondary | ICD-10-CM

## 2021-05-10 MED ORDER — AMOXICILLIN-POT CLAVULANATE 875-125 MG PO TABS: 1.0000 | ORAL_TABLET | Freq: Two times a day (BID) | ORAL | 0 refills | Status: DC

## 2021-05-10 NOTE — Telephone Encounter (Signed)
Patient scheduled.

## 2021-05-10 NOTE — Progress Notes (Signed)
Acute Office Visit  Subjective:    Patient ID: Belinda Owens, female    DOB: 1932-04-01, 87 y.o.   MRN: [id number]  Chief Complaint  Patient presents with   Fall    HPI Patient is in today for Head injury.  She was stepping up onto a concrete curb and missed stepped and fell forward with her neck slightly flexed so that she actually hit the top of her head she does not feel a palpable lump she says the day of the injury she felt okay overall she did not have a headache the next day she did have a headache but then the following day started to experience headache and was concerned so she actually went to the emergency department.  She has not been having any visual changes that she has felt lightheaded a couple of times.  No nausea or vomiting.  She did have a head CT which did not show any acute bleed etc. that she did have opacification of the right sphenoid sinus.  Past Medical History:  Diagnosis Date   Fibrocystic breast changes of both breasts    Gets yearly mammo and [place]   History of rheumatic fever     Past Surgical History:  Procedure Laterality Date   BREAST BIOPSY     MENISCUS REPAIR Right    RECONSTRUCTION OF EYELID Bilateral 12/14/2016   ROTATOR CUFF REPAIR Right     Family History  Problem Relation Age of Onset   Breast cancer Maternal Aunt    Colon cancer Father    Asthma Daughter    Heart disease Mother    Uterine cancer Mother    Cancer - Other Brother     Social History   Socioeconomic History   Marital status: Divorced    Spouse name: Not on file   Number of children: 3   Years of education: 13   Highest education level: Some college, no degree  Occupational History   Occupation: retired.     Comment: [employer]  Tobacco Use   Smoking status: Former    Types: Cigarettes    Quit date: 10/23/1957    Years since quitting: 63.5   Smokeless tobacco: Never  Vaping Use   Vaping Use: Never used  Substance and Sexual Activity   Alcohol use: Not  Currently    Alcohol/week: 0.0 standard drinks   Drug use: No   Sexual activity: Never    Partners: Male    Birth control/protection: None  Other Topics Concern   Not on file  Social History Narrative   Drinks decaf coffee.  Quit smoking 1959. Lives alone. Had three children, one has deceased.   Social Determinants of Health   Financial Resource Strain: Low Risk    Difficulty of Paying Living Expenses: Not hard at all  Food Insecurity: No Food Insecurity   Worried About Charity fundraiser in the Last Year: Never true   [place] in the Last Year: Never true  Transportation Needs: No Transportation Needs   Lack of Transportation (Medical): No   Lack of Transportation (Non-Medical): No  Physical Activity: Inactive   Days of Exercise per Week: 0 days   Minutes of Exercise per Session: 0 min  Stress: No Stress Concern Present   Feeling of Stress : Not at all  Social Connections: Moderately Integrated   Frequency of Communication with Friends and Family: More than three times a week   Frequency of Social Gatherings with Friends and  Family: Twice a week   Attends Religious Services: More than 4 times per year   Active Member of Clubs or Organizations: Yes   Attends Music therapist: More than 4 times per year   Marital Status: Divorced  Human resources officer Violence: Not At Risk   Fear of Current or Ex-Partner: No   Emotionally Abused: No   Physically Abused: No   Sexually Abused: No    Outpatient Medications Prior to Visit  Medication Sig Dispense Refill   albuterol (PROVENTIL HFA;VENTOLIN HFA) 108 (90 Base) MCG/ACT inhaler Inhale 2 puffs into the lungs every 6 (six) hours as needed for wheezing or shortness of breath. 18 g 1   amLODipine (NORVASC) 5 MG tablet Take 1 tablet (5 mg total) by mouth daily. 90 tablet 1   aspirin 81 MG tablet Take 81 mg by mouth daily.     losartan-hydrochlorothiazide (HYZAAR) 100-25 MG tablet Take 1 tablet by mouth daily. 90 tablet  1   metoprolol succinate (TOPROL-XL) 25 MG 24 hr tablet Take 1 tablet (25 mg total) by mouth daily. 90 tablet 1   pravastatin (PRAVACHOL) 20 MG tablet Take 1 tablet (20 mg total) by mouth at bedtime. 90 tablet 3   sertraline (ZOLOFT) 25 MG tablet TAKE ONE TABLET BY MOUTH DAILY 90 tablet 1   zolpidem (AMBIEN) 5 MG tablet TAKE ONE TABLET BY MOUTH EVERY NIGHT AT BEDTIME AS NEEDED FOR SLEEP 30 tablet 1   doxycycline (VIBRA-TABS) 100 MG tablet Take 1 tablet (100 mg total) by mouth 2 (two) times daily. 14 tablet 0   No facility-administered medications prior to visit.    Allergies  Allergen Reactions   Atenolol Other (See Comments) and Rash    Headache Headache   Atorvastatin Other (See Comments)    Myalgia   Fluoxetine Other (See Comments) and Rash    Tearfulness   Omeprazole Itching   Tylenol With Codeine #3  [Acetaminophen-Codeine] Nausea And Vomiting    Review of Systems     Objective:    Physical Exam Vitals reviewed.  Constitutional:      Appearance: She is well-developed.  HENT:     Head: Normocephalic and atraumatic.  Eyes:     Conjunctiva/sclera: Conjunctivae normal.  Cardiovascular:     Rate and Rhythm: Normal rate.  Pulmonary:     Effort: Pulmonary effort is normal.  Musculoskeletal:     Comments: Normal flexion.  Decreased extension though she says this is chronic.  Somewhat decreased range of motion right and left but fairly symmetric.  She had pain with rotation to the left.  Nontender cervical spine.  Skin:    General: Skin is dry.     Coloration: Skin is not pale.     Comments: No palpable lump on the scalp.  Neurological:     Mental Status: She is alert and oriented to person, place, and time.  Psychiatric:        Behavior: Behavior normal.    BP (!) 150/65   Pulse 67   Ht 5\' 4"  (1.626 m)   Wt 164 lb (74.4 kg)   SpO2 96%   BMI 28.15 kg/m  Wt Readings from Last 3 Encounters:  05/10/21 164 lb (74.4 kg)  03/16/21 167 lb (75.8 kg)  03/08/21 167 lb  (75.8 kg)    Health Maintenance Due  Topic Date Due   Zoster Vaccines- Shingrix (1 of 2) Never done   COVID-19 Vaccine (4 - Booster) 10/14/2020    There are no  preventive care reminders to display for this patient.   Lab Results  Component Value Date   TSH 2.43 02/19/2018   Lab Results  Component Value Date   WBC 6.7 01/10/2021   HGB 14.2 01/10/2021   HCT 42.3 01/10/2021   MCV 86.5 01/10/2021   PLT 260 01/10/2021   Lab Results  Component Value Date   NA 139 04/22/2021   K 3.9 04/22/2021   CO2 23 04/22/2021   GLUCOSE 99 04/22/2021   BUN 16 04/22/2021   CREATININE 0.83 04/22/2021   BILITOT 0.5 01/10/2021   ALKPHOS 56 07/06/2016   AST 16 01/10/2021   ALT 11 01/10/2021   PROT 7.1 01/10/2021   ALBUMIN 4.3 07/06/2016   CALCIUM 10.6 (H) 04/22/2021   Lab Results  Component Value Date   CHOL 251 (H) 01/10/2021   Lab Results  Component Value Date   HDL 73 01/10/2021   Lab Results  Component Value Date   LDLCALC 147 (H) 01/10/2021   Lab Results  Component Value Date   TRIG 174 (H) 01/10/2021   Lab Results  Component Value Date   CHOLHDL 3.4 01/10/2021   No results found for: HGBA1C     Assessment & Plan:   Problem List Items Addressed This Visit   None Visit Diagnoses     Injury of head, initial encounter    -  Primary   Post concussion syndrome       Acute non-recurrent sphenoidal sinusitis       Relevant Medications   amoxicillin-clavulanate (AUGMENTIN) 875-125 MG tablet      Acute sinusitis of the right sphenoid-unclear if acute or more chronic they did not comment on the CT scan she was treated for a migraine in May with prednisone and did improve.  Postconcussion syndrome-gave her warning signs and symptoms to monitor headache can last several days went over how to stay mobile within the home but also rest and not overstimulate or do things that increase her blood pressure and pulse.  Did encourage her to rest as needed.  She also has some neck  discomfort that started today so we discussed using heat or ice as well as gentle stretches that can also contribute to headaches as well.  Call if not improving after 1 week or if developing new symptoms etc.  Meds ordered this encounter  Medications   amoxicillin-clavulanate (AUGMENTIN) 875-125 MG tablet    Sig: Take 1 tablet by mouth 2 (two) times daily.    Dispense:  20 tablet    Refill:  0      Beatrice Lecher, MD

## 2021-05-11 DIAGNOSIS — Z79899 Other long term (current) drug therapy: Secondary | ICD-10-CM | POA: Diagnosis not present

## 2021-05-11 DIAGNOSIS — S0990XA Unspecified injury of head, initial encounter: Secondary | ICD-10-CM | POA: Diagnosis not present

## 2021-05-11 DIAGNOSIS — S0093XA Contusion of unspecified part of head, initial encounter: Secondary | ICD-10-CM | POA: Diagnosis not present

## 2021-05-11 DIAGNOSIS — I10 Essential (primary) hypertension: Secondary | ICD-10-CM | POA: Diagnosis not present

## 2021-05-11 DIAGNOSIS — G8911 Acute pain due to trauma: Secondary | ICD-10-CM | POA: Diagnosis not present

## 2021-05-11 DIAGNOSIS — Z7982 Long term (current) use of aspirin: Secondary | ICD-10-CM | POA: Diagnosis not present

## 2021-05-11 DIAGNOSIS — R9431 Abnormal electrocardiogram [ECG] [EKG]: Secondary | ICD-10-CM | POA: Diagnosis not present

## 2021-05-11 DIAGNOSIS — Z9889 Other specified postprocedural states: Secondary | ICD-10-CM | POA: Diagnosis not present

## 2021-05-11 DIAGNOSIS — Z885 Allergy status to narcotic agent status: Secondary | ICD-10-CM | POA: Diagnosis not present

## 2021-05-11 DIAGNOSIS — Z20822 Contact with and (suspected) exposure to covid-19: Secondary | ICD-10-CM | POA: Diagnosis not present

## 2021-05-11 DIAGNOSIS — Z888 Allergy status to other drugs, medicaments and biological substances status: Secondary | ICD-10-CM | POA: Diagnosis not present

## 2021-05-11 DIAGNOSIS — Z86010 Personal history of colonic polyps: Secondary | ICD-10-CM | POA: Diagnosis not present

## 2021-05-17 ENCOUNTER — Encounter: Payer: Self-pay | Admitting: Family Medicine

## 2021-05-17 ENCOUNTER — Other Ambulatory Visit: Payer: Self-pay

## 2021-05-17 ENCOUNTER — Ambulatory Visit: Payer: Medicare HMO | Admitting: Family Medicine

## 2021-05-17 ENCOUNTER — Ambulatory Visit (INDEPENDENT_AMBULATORY_CARE_PROVIDER_SITE_OTHER): Payer: Medicare HMO | Admitting: Family Medicine

## 2021-05-17 VITALS — BP 119/64 | HR 62 | Ht 64.0 in | Wt 167.0 lb

## 2021-05-17 DIAGNOSIS — J0130 Acute sphenoidal sinusitis, unspecified: Secondary | ICD-10-CM | POA: Diagnosis not present

## 2021-05-17 DIAGNOSIS — E871 Hypo-osmolality and hyponatremia: Secondary | ICD-10-CM

## 2021-05-17 DIAGNOSIS — F0781 Postconcussional syndrome: Secondary | ICD-10-CM | POA: Diagnosis not present

## 2021-05-17 DIAGNOSIS — E876 Hypokalemia: Secondary | ICD-10-CM | POA: Diagnosis not present

## 2021-05-17 NOTE — Progress Notes (Signed)
Established Patient Office Visit  Subjective:  Patient ID: Belinda Owens, female    DOB: [birth year]  Age: 87 y.o. MRN: [id number]  CC:  Chief Complaint  Patient presents with   Hospitalization Follow-up    Seen in ED for Closed Head Injury on 05/11/2021.     HPI Belinda Owens presents for f/u ED. please see prior note from 7/19.  Prior day on 7/18 she was stepping up on the curb and missed stepped her toe hit the edge of the curb and she fell forward and hit her head she had a negative CT except it did show sinusitis.  I saw her the following day and she started having a headache that day.  She actually went to the emergency department again on the 20th because she just did not feel right they checked everything out she did have a low sodium and a low potassium level that ended up sending her home.  She says she is mostly been trying to just take it easy and not do anything overly strenuous at home she feels like the headaches have gotten better overall though they do ramped up a little bit more in the evenings she has not had any visual changes or nausea or other neurologic symptoms.  She did start the antibiotics for the sinusitis about 5 days ago.  Past Medical History:  Diagnosis Date   Fibrocystic breast changes of both breasts    Gets yearly mammo and [place]   History of rheumatic fever     Past Surgical History:  Procedure Laterality Date   BREAST BIOPSY     MENISCUS REPAIR Right    RECONSTRUCTION OF EYELID Bilateral 12/14/2016   ROTATOR CUFF REPAIR Right     Family History  Problem Relation Age of Onset   Breast cancer Maternal Aunt    Colon cancer Father    Asthma Daughter    Heart disease Mother    Uterine cancer Mother    Cancer - Other Brother     Social History   Socioeconomic History   Marital status: Divorced    Spouse name: Not on file   Number of children: 3   Years of education: 13   Highest education level: Some college, no degree  Occupational History    Occupation: retired.     Comment: [employer]  Tobacco Use   Smoking status: Former    Types: Cigarettes    Quit date: 10/23/1957    Years since quitting: 63.6   Smokeless tobacco: Never  Vaping Use   Vaping Use: Never used  Substance and Sexual Activity   Alcohol use: Not Currently    Alcohol/week: 0.0 standard drinks   Drug use: No   Sexual activity: Never    Partners: Male    Birth control/protection: None  Other Topics Concern   Not on file  Social History Narrative   Drinks decaf coffee.  Quit smoking 1959. Lives alone. Had three children, one has deceased.   Social Determinants of Health   Financial Resource Strain: Low Risk    Difficulty of Paying Living Expenses: Not hard at all  Food Insecurity: No Food Insecurity   Worried About Charity fundraiser in the Last Year: Never true   [place] in the Last Year: Never true  Transportation Needs: No Transportation Needs   Lack of Transportation (Medical): No   Lack of Transportation (Non-Medical): No  Physical Activity: Inactive   Days of Exercise per Week: 0  days   Minutes of Exercise per Session: 0 min  Stress: No Stress Concern Present   Feeling of Stress : Not at all  Social Connections: Moderately Integrated   Frequency of Communication with Friends and Family: More than three times a week   Frequency of Social Gatherings with Friends and Family: Twice a week   Attends Religious Services: More than 4 times per year   Active Member of [employer] or Organizations: Yes   Attends Music therapist: More than 4 times per year   Marital Status: Divorced  Human resources officer Violence: Not At Risk   Fear of Current or Ex-Partner: No   Emotionally Abused: No   Physically Abused: No   Sexually Abused: No    Outpatient Medications Prior to Visit  Medication Sig Dispense Refill   albuterol (PROVENTIL HFA;VENTOLIN HFA) 108 (90 Base) MCG/ACT inhaler Inhale 2 puffs into the lungs every 6 (six) hours as  needed for wheezing or shortness of breath. 18 g 1   amLODipine (NORVASC) 5 MG tablet Take 1 tablet (5 mg total) by mouth daily. 90 tablet 1   amoxicillin-clavulanate (AUGMENTIN) 875-125 MG tablet Take 1 tablet by mouth 2 (two) times daily. 20 tablet 0   aspirin 81 MG tablet Take 81 mg by mouth daily.     losartan-hydrochlorothiazide (HYZAAR) 100-25 MG tablet Take 1 tablet by mouth daily. 90 tablet 1   metoprolol succinate (TOPROL-XL) 25 MG 24 hr tablet Take 1 tablet (25 mg total) by mouth daily. 90 tablet 1   pravastatin (PRAVACHOL) 20 MG tablet Take 1 tablet (20 mg total) by mouth at bedtime. 90 tablet 3   sertraline (ZOLOFT) 25 MG tablet TAKE ONE TABLET BY MOUTH DAILY 90 tablet 1   zolpidem (AMBIEN) 5 MG tablet TAKE ONE TABLET BY MOUTH EVERY NIGHT AT BEDTIME AS NEEDED FOR SLEEP 30 tablet 1   No facility-administered medications prior to visit.    Allergies  Allergen Reactions   Atenolol Other (See Comments) and Rash    Headache Headache   Atorvastatin Other (See Comments)    Myalgia   Fluoxetine Other (See Comments) and Rash    Tearfulness   Omeprazole Itching   Tylenol With Codeine #3  [Acetaminophen-Codeine] Nausea And Vomiting    ROS Review of Systems    Objective:    Physical Exam Constitutional:      Appearance: Normal appearance. She is well-developed.  HENT:     Head: Normocephalic and atraumatic.  Cardiovascular:     Rate and Rhythm: Normal rate and regular rhythm.     Heart sounds: Normal heart sounds.  Pulmonary:     Effort: Pulmonary effort is normal.     Breath sounds: Normal breath sounds.  Skin:    General: Skin is warm and dry.  Neurological:     General: No focal deficit present.     Mental Status: She is alert and oriented to person, place, and time.     Cranial Nerves: No cranial nerve deficit.     Motor: No weakness.     Coordination: Coordination normal.     Gait: Gait normal.  Psychiatric:        Behavior: Behavior normal.    BP 119/64    Pulse 62   Ht '5\' 4"'$  (1.626 m)   Wt 167 lb 0.3 oz (75.8 kg)   SpO2 98%   BMI 28.67 kg/m  Wt Readings from Last 3 Encounters:  05/17/21 167 lb 0.3 oz (75.8 kg)  05/10/21  164 lb (74.4 kg)  03/16/21 167 lb (75.8 kg)     Health Maintenance Due  Topic Date Due   Zoster Vaccines- Shingrix (1 of 2) Never done   COVID-19 Vaccine (4 - Booster) 10/14/2020    There are no preventive care reminders to display for this patient.  Lab Results  Component Value Date   TSH 2.43 02/19/2018   Lab Results  Component Value Date   WBC 6.7 01/10/2021   HGB 14.2 01/10/2021   HCT 42.3 01/10/2021   MCV 86.5 01/10/2021   PLT 260 01/10/2021   Lab Results  Component Value Date   NA 139 04/22/2021   K 3.9 04/22/2021   CO2 23 04/22/2021   GLUCOSE 99 04/22/2021   BUN 16 04/22/2021   CREATININE 0.83 04/22/2021   BILITOT 0.5 01/10/2021   ALKPHOS 56 07/06/2016   AST 16 01/10/2021   ALT 11 01/10/2021   PROT 7.1 01/10/2021   ALBUMIN 4.3 07/06/2016   CALCIUM 10.6 (H) 04/22/2021   Lab Results  Component Value Date   CHOL 251 (H) 01/10/2021   Lab Results  Component Value Date   HDL 73 01/10/2021   Lab Results  Component Value Date   LDLCALC 147 (H) 01/10/2021   Lab Results  Component Value Date   TRIG 174 (H) 01/10/2021   Lab Results  Component Value Date   CHOLHDL 3.4 01/10/2021   No results found for: HGBA1C    Assessment & Plan:   Problem List Items Addressed This Visit   None Visit Diagnoses     Hyponatremia    -  Primary   Relevant Orders   BASIC METABOLIC PANEL WITH GFR   Post concussion syndrome       Acute non-recurrent sphenoidal sinusitis       Hypokalemia          Postconcussion syndrome she still having some persistent headaches but they are gradually getting better we discussed that they can sometimes last for weeks to even months though hopefully it will not be that long continue to do low intensity activity.  Call if symptoms change or worsen at any  point.  Neuro exam was normal today.  Hyponatremia and hypokalemia-we will recheck electrolyte levels today we had actually just done blood work on her about 4 weeks ago before the head injury and everything was normal.  Sinusitis-make sure to complete any of antibiotics.  No orders of the defined types were placed in this encounter.   Follow-up: No follow-ups on file.    Beatrice Lecher, MD

## 2021-05-26 DIAGNOSIS — E661 Drug-induced obesity: Secondary | ICD-10-CM | POA: Diagnosis not present

## 2021-05-26 DIAGNOSIS — Z9189 Other specified personal risk factors, not elsewhere classified: Secondary | ICD-10-CM | POA: Diagnosis not present

## 2021-05-26 DIAGNOSIS — N6092 Unspecified benign mammary dysplasia of left breast: Secondary | ICD-10-CM | POA: Diagnosis not present

## 2021-05-26 DIAGNOSIS — Z6831 Body mass index (BMI) 31.0-31.9, adult: Secondary | ICD-10-CM | POA: Diagnosis not present

## 2021-05-26 DIAGNOSIS — Z1231 Encounter for screening mammogram for malignant neoplasm of breast: Secondary | ICD-10-CM | POA: Diagnosis not present

## 2021-06-02 ENCOUNTER — Telehealth: Payer: Self-pay | Admitting: Pharmacist

## 2021-06-02 NOTE — Chronic Care Management (AMB) (Signed)
Chronic Care Management Pharmacy Assistant   Name: Belinda Owens  MRN: [id number] DOB: 1932/08/05  Belinda Owens is an 87 y.o. year old female who presents for his initial CCM visit with the clinical pharmacist.  Recent office visits:  05/17/21-Catherine D. Madilyn Fireman, MD (PCP) Hospital follow up. Labs ordered. 05/10/21-Catherine D. Madilyn Fireman, MD (PCP) Seen for a fall. Start Augmentin 875-125 mg tab. 04/22/21-Thomas Thekkekandam. Mass of leg. X-rays ordered and MRI ordered. 03/16/21-Catherine D. Madilyn Fireman, MD (PCP) Seen for Sinus pressure. Start Doxycycline 100 mg tabs. 03/08/21-Catherine D. Madilyn Fireman, MD (PCP) Curley Spice or Sinusitis and Migraine. Start on Prednisone 10 mg.  03/03/21-Taylor B. Beck, NP(Video Visit) Seen for Upper respiratory infection. Return if symtoms worsen or fail to improve. 01/10/21-Catherine D. Madilyn Fireman, MD (PCP) Seen for Hypertension. Labs ordered. Follow up in 6 months.  Recent consult visits:  12/08/20-Geoffrey D. Rieser ([facility]). Notes not available. 12/06/20-Dennis M. Floyce [facility]) Notes not available.  Hospital visits:  Medication Reconciliation was completed by comparing discharge summary, patient's EMR and Pharmacy list, and upon discussion with patient.  Admitted to the hospital on 05/11/21 due to Medical screening. Discharge date was 05/11/21. Discharged from [facility]?Medications Started at [facility] Discharge:?? -started None noted  Medication Changes at Hospital Discharge: -Changed None noted  Medications Discontinued at Hospital Discharge: -Stopped None noted  Medications that remain the same after Hospital Discharge:??  -All other medications will remain the same.     Medication Reconciliation was completed by comparing discharge summary, patient's EMR and Pharmacy list, and upon discussion with patient.  Admitted to the hospital on 05/09/21 due  to Fall. Discharge date was 05/09/21. Discharged from [place]?Medications Started at [facility] Discharge:?? -started none noted  Medication Changes at Hospital Discharge: -Changed none noted  Medications Discontinued at Hospital Discharge: -Stopped none noted  Medications that remain the same after Hospital Discharge:??  -All other medications will remain the same.    Medications: Outpatient Encounter Medications as of 06/02/2021  Medication Sig   albuterol (PROVENTIL HFA;VENTOLIN HFA) 108 (90 Base) MCG/ACT inhaler Inhale 2 puffs into the lungs every 6 (six) hours as needed for wheezing or shortness of breath.   amLODipine (NORVASC) 5 MG tablet Take 1 tablet (5 mg total) by mouth daily.   amoxicillin-clavulanate (AUGMENTIN) 875-125 MG tablet Take 1 tablet by mouth 2 (two) times daily.   aspirin 81 MG tablet Take 81 mg by mouth daily.   losartan-hydrochlorothiazide (HYZAAR) 100-25 MG tablet Take 1 tablet by mouth daily.   metoprolol succinate (TOPROL-XL) 25 MG 24 hr tablet Take 1 tablet (25 mg total) by mouth daily.   pravastatin (PRAVACHOL) 20 MG tablet Take 1 tablet (20 mg total) by mouth at bedtime.   sertraline (ZOLOFT) 25 MG tablet TAKE ONE TABLET BY MOUTH DAILY   zolpidem (AMBIEN) 5 MG tablet TAKE ONE TABLET BY MOUTH EVERY NIGHT AT BEDTIME AS NEEDED FOR SLEEP   No facility-administered encounter medications on file as of 06/02/2021.   Albuterol (PROVENTIL HFA;VENTOLIN HFA) 108 (90 Base) MCG/ACT inhaler Last filled:None noted AmLODipine (NORVASC) 5 MG tablet  Last filled:04/14/21 90 DS Amoxicillin-clavulanate (AUGMENTIN) 875-125 MG tablet  Last filled:None noted Aspirin 81 MG tablet  Last filled:None noted Losartan-hydrochlorothiazide (HYZAAR) 100-25 MG tablet  Last filled:04/14/21 90 DS Metoprolol succinate (TOPROL-XL) 25 MG 24 hr tablet  Last filled:04/14/21 90 DS Pravastatin (PRAVACHOL) 20 MG tablet  Last filled:05/09/2289 DS Sertraline  (  ZOLOFT) 25 MG tablet  Last filled:03/20/21 90 DS Zolpidem (AMBIEN) 5 MG tablet  Last filled:04/14/21 30   Star Rating Drugs: Pravastatin (PRAVACHOL) 20 MG tablet  Last filled:05/09/2289 DS Losartan-hydrochlorothiazide (HYZAAR) 100-25 MG tablet  Last filled:04/14/21 90 DS  Myriam Elta [place], [place]

## 2021-06-09 ENCOUNTER — Ambulatory Visit (INDEPENDENT_AMBULATORY_CARE_PROVIDER_SITE_OTHER): Payer: Medicare HMO | Admitting: Pharmacist

## 2021-06-09 ENCOUNTER — Other Ambulatory Visit: Payer: Self-pay

## 2021-06-09 DIAGNOSIS — E785 Hyperlipidemia, unspecified: Secondary | ICD-10-CM | POA: Diagnosis not present

## 2021-06-09 DIAGNOSIS — I10 Essential (primary) hypertension: Secondary | ICD-10-CM | POA: Diagnosis not present

## 2021-06-09 NOTE — Progress Notes (Signed)
Chronic Care Management Pharmacy Note  06/09/2021 Name:  Belinda Owens MRN:  [id number] DOB:  April 06, 1932  Summary: addressed HTN, HLD  Recommendations/Changes made from today's visit: lifestyle modifications for cholesterol control, pt wishes to recheck lipids at next PCP visit for trend, consider increasing pravastatin at that time if still remains elevated.  Plan: f/u with pharmacist in 5 months  Subjective: Belinda Owens is an 87 y.o. year old female who is a primary patient of Metheney, Rene Kocher, MD.  The CCM team was consulted for assistance with disease management and care coordination needs.    Engaged with patient by telephone for initial visit in response to provider referral for pharmacy case management and/or care coordination services.   Consent to Services:  The patient was given information about Chronic Care Management services, agreed to services, and gave verbal consent prior to initiation of services.  Please see initial visit note for detailed documentation.   Patient Care Team: Hali Marry, MD as PCP - General (Family Medicine) Darius Bump, [facility] as Pharmacist (Pharmacist)  Recent office visits:  05/17/21-Catherine D. Madilyn Fireman, MD (PCP) Hospital follow up. Labs ordered. 05/10/21-Catherine D. Madilyn Fireman, MD (PCP) Seen for a fall. Start Augmentin 875-125 mg tab. 04/22/21-Thomas Thekkekandam. Mass of leg. X-rays ordered and MRI ordered. 03/16/21-Catherine D. Madilyn Fireman, MD (PCP) Seen for Sinus pressure. Start Doxycycline 100 mg tabs. 03/08/21-Catherine D. Madilyn Fireman, MD (PCP) Curley Spice or Sinusitis and Migraine. Start on Prednisone 10 mg.  03/03/21-Taylor B. Beck, NP(Video Visit) Seen for Upper respiratory infection. Return if symtoms worsen or fail to improve. 01/10/21-Catherine D. Madilyn Fireman, MD (PCP) Seen for Hypertension. Labs ordered. Follow up in 6 months.   Recent consult visits:  12/08/20-Geoffrey D. Rieser ([place] center). Notes not  available. 12/06/20-Dennis M. Floyce Stakes [facility] center) Notes not available.   Hospital visits:  Medication Reconciliation was completed by comparing discharge summary, patient's EMR and Pharmacy list, and upon discussion with patient.   Admitted to the hospital on 05/11/21 due to Medical screening. Discharge date was 05/11/21. Discharged from [facility] [place]?Medications Started at [facility] Discharge:?? -started None noted   Medication Changes at Hospital Discharge: -Changed None noted   Medications Discontinued at Hospital Discharge: -Stopped None noted   Medications that remain the same after Hospital Discharge:??  -All other medications will remain the same.       Medication Reconciliation was completed by comparing discharge summary, patient's EMR and Pharmacy list, and upon discussion with patient.   Admitted to the hospital on 05/09/21 due to Fall. Discharge date was 05/09/21. Discharged from [place]?Medications Started at [facility] Discharge:?? -started none noted   Medication Changes at Hospital Discharge: -Changed none noted   Medications Discontinued at Hospital Discharge: -Stopped none noted   Medications that remain the same after Hospital Discharge:??  -All other medications will remain the same.    Objective:  Lab Results  Component Value Date   CREATININE 0.83 04/22/2021   CREATININE 0.99 (H) 01/10/2021   CREATININE 0.87 01/07/2020        Component Value Date/Time   CHOL 251 (H) 01/10/2021 1216   TRIG 174 (H) 01/10/2021 1216   HDL 73 01/10/2021 1216   CHOLHDL 3.4 01/10/2021 1216   VLDL 31 (H) 07/06/2016 1432   LDLCALC 147 (H) 01/10/2021 1216    Hepatic Function Latest Ref Rng & Units 01/10/2021 01/07/2020 02/19/2018  Total Protein 6.1 - 8.1 g/dL  7.1 7.3 7.2  Albumin 3.6 - 5.1 g/dL - - -  AST 10 - 35 U/L 16 27 23   ALT 6 - 29 U/L 11 28 16   Alk Phosphatase 33  - 130 U/L - - -  Total Bilirubin 0.2 - 1.2 mg/dL 0.5 0.5 0.5  Bilirubin, Direct <=0.2 mg/dL - - -    Lab Results  Component Value Date/Time   TSH 2.43 02/19/2018 03:28 PM   TSH 2.023 11/04/2015 10:46 AM    CBC Latest Ref Rng & Units 01/10/2021 02/19/2018 01/27/2013  WBC 3.8 - 10.8 Thousand/uL 6.7 6.6 9.1  Hemoglobin 11.7 - 15.5 g/dL 14.2 13.9 13.9  Hematocrit 35.0 - 45.0 % 42.3 40.3 -  Platelets 140 - 400 Thousand/uL 260 276 279    Social History   Tobacco Use  Smoking Status Former   Types: Cigarettes   Quit date: 10/23/1957   Years since quitting: 63.6  Smokeless Tobacco Never   BP Readings from Last 3 Encounters:  05/17/21 119/64  05/10/21 (!) 150/65  03/16/21 133/63   Pulse Readings from Last 3 Encounters:  05/17/21 62  05/10/21 67  03/16/21 83   Wt Readings from Last 3 Encounters:  05/17/21 167 lb 0.3 oz (75.8 kg)  05/10/21 164 lb (74.4 kg)  03/16/21 167 lb (75.8 kg)    Assessment: Review of patient past medical history, allergies, medications, health status, including review of consultants reports, laboratory and other test data, was performed as part of comprehensive evaluation and provision of chronic care management services.   SDOH:  (Social Determinants of Health) assessments and interventions performed:    CCM Care Plan  Allergies  Allergen Reactions   Atenolol Other (See Comments) and Rash    Headache Headache   Atorvastatin Other (See Comments)    Myalgia   Fluoxetine Other (See Comments) and Rash    Tearfulness   Omeprazole Itching   Tylenol With Codeine #3  [Acetaminophen-Codeine] Nausea And Vomiting    Medications Reviewed Today     Reviewed by Darius Bump, [facility] (Pharmacist) on 06/09/21 at 1122  Med List Status: <None>   Medication Order Taking? Sig Documenting Provider Last Dose Status Informant  albuterol (PROVENTIL HFA;VENTOLIN HFA) 108 (90 Base) MCG/ACT inhaler [id number] No Inhale 2 puffs into the lungs every 6 (six) hours as  needed for wheezing or shortness of breath.  Patient not taking: Reported on 06/09/2021   Hali Marry, MD Not Taking Active   amLODipine (NORVASC) 5 MG tablet [id number] Yes Take 1 tablet (5 mg total) by mouth daily. Hali Marry, MD Taking Active   aspirin 81 MG tablet [id number] No Take 81 mg by mouth daily.  Patient not taking: Reported on 06/09/2021   [provider] Not Taking Active   Calcium Carbonate-Vitamin D (CALCIUM 600+D PO) [id number] Yes Take 1 tablet by mouth daily. [provider] Taking Active   Cholecalciferol (VITAMIN D3) 1.25 MG (50000 UT) CAPS [id number] Yes Take 1 capsule by mouth daily. [provider] Taking Active   losartan-hydrochlorothiazide (HYZAAR) 100-25 MG tablet [id number] Yes Take 1 tablet by mouth daily. Hali Marry, MD Taking Active   metoprolol succinate (TOPROL-XL) 25 MG 24 hr tablet [id number] Yes Take 1 tablet (25 mg total) by mouth daily. Hali Marry, MD Taking Active   Multiple Vitamins-Minerals (CENTRUM SILVER 50+WOMEN PO) [id number] Yes Take 1 tablet by mouth daily. [provider] Taking Active   Multiple Vitamins-Minerals (ZINC PO) [id number] Yes Take 50 mg by mouth  daily. [provider] Taking Active   pravastatin (PRAVACHOL) 20 MG tablet [id number] Yes Take 1 tablet (20 mg total) by mouth at bedtime. Hali Marry, MD Taking Active   sertraline (ZOLOFT) 25 MG tablet [id number] Yes TAKE ONE TABLET BY MOUTH DAILY Hali Marry, MD Taking Active   vitamin B-12 (CYANOCOBALAMIN) 500 MCG tablet [id number] Yes Take 500 mcg by mouth daily. [provider] Taking Active   zolpidem (AMBIEN) 5 MG tablet [id number] Yes TAKE ONE TABLET BY MOUTH EVERY NIGHT AT BEDTIME AS NEEDED FOR SLEEP Hali Marry, MD Taking Active             Patient Active Problem List   Diagnosis Date Noted   Mass of leg, left 04/22/2021   Migraine 03/08/2021   History of TIA  (transient ischemic attack) 01/10/2021   Primary osteoarthritis of right wrist 10/02/2018   De Quervain's tenosynovitis, right 10/02/2018   CKD (chronic kidney disease) stage 2, GFR 60-89 ml/min 08/18/2018   Myalgia due to statin 02/19/2018   Adenoma of duodenum 04/10/2017   Dermatochalasis of both upper eyelids 12/14/2016   Atypical ductal hyperplasia of left breast 12/01/2016   Sedentary lifestyle 12/01/2016   Class 1 drug-induced obesity without serious comorbidity with body mass index (BMI) of 31.0 to 31.9 in adult 12/01/2016   At risk for breast cancer 12/01/2016   Primary osteoarthritis of left knee 06/08/2016   Osteopenia 09/29/2015   Trochanteric bursitis of right hip 05/05/2015   Ear ringing sound 04/27/2015   Bilateral shoulder pain 03/16/2015   Degenerative disc disease, cervical 03/16/2015   Insomnia 03/09/2015   Hyperlipidemia 02/03/2015   Essential hypertension 02/03/2015   DDD (degenerative disc disease), lumbar 02/03/2015   GERD (gastroesophageal reflux disease) 02/03/2015   Anxiety state 02/03/2015   Fibrocystic breast changes of both breasts 02/03/2015   Microscopic hematuria 02/03/2015   Allergic bronchitis 02/03/2015    Immunization History  Administered Date(s) Administered   Fluad Quad(high Dose 65+) 07/15/2019   Influenza, High Dose Seasonal PF 07/16/2017, 07/17/2018   Influenza,inj,Quad PF,6+ Mos 08/05/2015, 07/06/2016   Influenza-Unspecified 07/15/2020   Moderna Sars-Covid-2 Vaccination 01/01/2020, 02/03/2020   PFIZER(Purple Top)SARS-COV-2 Vaccination 07/15/2020   Pneumococcal Conjugate-13 08/19/2015   Pneumococcal Polysaccharide-23 01/04/2017   Tdap 03/23/2014    Conditions to be addressed/monitored: HTN and HLD  Care Plan : Medication Management  Updates made by Darius Bump, [facility] since 06/09/2021 12:00 AM     Problem: HTN, HLD      Long-Range Goal: Disease Progression Prevention   Start Date: 06/09/2021  This Visit's Progress: On track   Priority: High  Note:   Current Barriers:  None at present  Pharmacist Clinical Goal(s):  Over the next 150 days, patient will maintain control of chronic conditions as evidenced by medication fill history, lab values, and vital signs  through collaboration with PharmD and provider.   Interventions: 1:1 collaboration with Hali Marry, MD regarding development and update of comprehensive plan of care as evidenced by provider attestation and co-signature Inter-disciplinary care team collaboration (see longitudinal plan of care) Comprehensive medication review performed; medication list updated in electronic medical record  Hypertension:  Controlled; current treatment:amlodipine 29m daily, losartan-hctz 100-260mdaily, metoprolol XL 2599maily;   Current home readings: usually 130s/80s   Denies hypotensive/hypertensive symptoms  Recommended continue current regimen and  Hyperlipidemia:  Uncontrolled; current treatment:pravastatin 21m18mily;   Counseled on lifestyle/diet modifications to assist in cholestrol control Recommended consider increased dose of pravastatin 21mg16mcholesterol  remains elevated at upcoming PCP visit, where Ms. Laubscher wants to have her labs rechecked to trend.  Patient Goals/Self-Care Activities Over the next 150 days, patient will:  take medications as prescribed and engage in dietary modifications by leaning toward lean meats, vegetables, fruits, nuts/legumes and choose mindful portions of red meats, high-fat dairy or eggs in moderation (due to cholesterol)  Follow Up Plan: Telephone follow up appointment with care management team member scheduled for:  5 months      Medication Assistance: None required.  Patient affirms current coverage meets needs.  Patient's preferred pharmacy is:  [id number] - [address], [address] [place] [address] Phone: [phone] Fax: [phone]  Uses pill box?  Yes Pt endorses 100% compliance  Follow Up:  Patient agrees to Care Plan and Follow-up.  Plan: Telephone follow up appointment with care management team member scheduled for:  5 months  Darius Bump

## 2021-06-09 NOTE — Patient Instructions (Addendum)
Cholesterol Content in Foods Cholesterol is a waxy, fat-like substance that helps to carry fat in the blood. The body needs cholesterol in small amounts, but too much cholesterol can causedamage to the arteries and heart. Most people should eat less than 200 milligrams (mg) of cholesterol a day. Foods with cholesterol  Cholesterol is found in animal-based foods, such as meat, seafood, and dairy. Generally, low-fat dairy and lean meats have less cholesterol than full-fat dairy and fatty meats. The milligrams of cholesterol per serving (mg per serving) of common cholesterol-containing foods are listed below. Meat and other proteins Egg -- one large whole egg has 186 mg. Veal shank -- 4 oz has 141 mg. Lean ground turkey (93% lean) -- 4 oz has 118 mg. Fat-trimmed lamb loin -- 4 oz has 106 mg. Lean ground beef (90% lean) -- 4 oz has 100 mg. Lobster -- 3.5 oz has 90 mg. Pork loin chops -- 4 oz has 86 mg. Canned salmon -- 3.5 oz has 83 mg. Fat-trimmed beef top loin -- 4 oz has 78 mg. Frankfurter -- 1 frank (3.5 oz) has 77 mg. Crab -- 3.5 oz has 71 mg. Roasted chicken without skin, white meat -- 4 oz has 66 mg. Light bologna -- 2 oz has 45 mg. Deli-cut turkey -- 2 oz has 31 mg. Canned tuna -- 3.5 oz has 31 mg. Bacon -- 1 oz has 29 mg. Oysters and mussels (raw) -- 3.5 oz has 25 mg. Mackerel -- 1 oz has 22 mg. Trout -- 1 oz has 20 mg. Pork sausage -- 1 link (1 oz) has 17 mg. Salmon -- 1 oz has 16 mg. Tilapia -- 1 oz has 14 mg. Dairy Soft-serve ice cream --  cup (4 oz) has 103 mg. Whole-milk yogurt -- 1 cup (8 oz) has 29 mg. Cheddar cheese -- 1 oz has 28 mg. American cheese -- 1 oz has 28 mg. Whole milk -- 1 cup (8 oz) has 23 mg. 2% milk -- 1 cup (8 oz) has 18 mg. Cream cheese -- 1 tablespoon (Tbsp) has 15 mg. Cottage cheese --  cup (4 oz) has 14 mg. Low-fat (1%) milk -- 1 cup (8 oz) has 10 mg. Sour cream -- 1 Tbsp has 8.5 mg. Low-fat yogurt -- 1 cup (8 oz) has 8 mg. Nonfat Greek  yogurt -- 1 cup (8 oz) has 7 mg. Half-and-half cream -- 1 Tbsp has 5 mg. Fats and oils Cod liver oil -- 1 tablespoon (Tbsp) has 82 mg. Butter -- 1 Tbsp has 15 mg. Lard -- 1 Tbsp has 14 mg. Bacon grease -- 1 Tbsp has 14 mg. Mayonnaise -- 1 Tbsp has 5-10 mg. Margarine -- 1 Tbsp has 3-10 mg. Exact amounts of cholesterol in these foods may vary depending on specificingredients and brands. Foods without cholesterol Most plant-based foods do not have cholesterol unless you combine them with a food that has cholesterol. Foods without cholesterol include: Grains and cereals. Vegetables. Fruits. Vegetable oils, such as olive, canola, and sunflower oil. Legumes, such as peas, beans, and lentils. Nuts and seeds. Egg whites. Summary The body needs cholesterol in small amounts, but too much cholesterol can cause damage to the arteries and heart. Most people should eat less than 200 milligrams (mg) of cholesterol a day. This information is not intended to replace advice given to you by your health care provider. Make sure you discuss any questions you have with your healthcare provider. Document Revised: 01/20/2020 Document Reviewed: 03/01/2020 Elsevier Patient Education  2022   Elsevier Inc[employer]   Visit Information   PATIENT GOALS:   Goals Addressed             This Visit's Progress    Medication Management       Patient Goals/Self-Care Activities Over the next 150 days, patient will:  take medications as prescribed and engage in dietary modifications by leaning toward lean meats, vegetables, fruits, nuts/legumes and choose mindful portions of red meats, high-fat dairy or eggs in moderation (due to cholesterol)  Follow Up Plan: Telephone follow up appointment with care management team member scheduled for:  5 months         Consent to CCM Services: Belinda Owens was given information about Chronic Care Management services including:  CCM service includes personalized support from  designated clinical staff supervised by her physician, including individualized plan of care and coordination with other care providers 24/7 contact phone numbers for assistance for urgent and routine care needs. Service will only be billed when office clinical staff spend 20 minutes or more in a month to coordinate care. Only one practitioner may furnish and bill the service in a calendar month. The patient may stop CCM services at any time (effective at the end of the month) by phone call to the office staff. The patient will be responsible for cost sharing (co-pay) of up to 20% of the service fee (after annual deductible is met).  Patient agreed to services and verbal consent obtained.   The patient verbalized understanding of instructions, educational materials, and care plan provided today and agreed to receive a mailed copy of patient instructions, educational materials, and care plan.   Telephone follow up appointment with care management team member scheduled for: 5 months Belinda Owens   CLINICAL CARE PLAN: Patient Care Plan: Medication Management     Problem Identified: HTN, HLD      Long-Range Goal: Disease Progression Prevention   Start Date: 06/09/2021  This Visit's Progress: On track  Priority: High  Note:   Current Barriers:  None at present  Pharmacist Clinical Goal(s):  Over the next 150 days, patient will maintain control of chronic conditions as evidenced by medication fill history, lab values, and vital signs  through collaboration with PharmD and provider.   Interventions: 1:1 collaboration with Metheney, Catherine D, MD regarding development and update of comprehensive plan of care as evidenced by provider attestation and co-signature Inter-disciplinary care team collaboration (see longitudinal plan of care) Comprehensive medication review performed; medication list updated in electronic medical record  Hypertension:  Controlled; current  treatment:amlodipine 5mg daily, losartan-hctz 100-25mg daily, metoprolol XL 25mg daily;   Current home readings: usually 130s/80s   Denies hypotensive/hypertensive symptoms  Recommended continue current regimen and  Hyperlipidemia:  Uncontrolled; current treatment:pravastatin 20mg daily;   Counseled on lifestyle/diet modifications to assist in cholestrol control Recommended consider increased dose of pravastatin 20mg if cholesterol remains elevated at upcoming PCP visit, where Belinda Owens wants to have her labs rechecked to trend.  Patient Goals/Self-Care Activities Over the next 150 days, patient will:  take medications as prescribed and engage in dietary modifications by leaning toward lean meats, vegetables, fruits, nuts/legumes and choose mindful portions of red meats, high-fat dairy or eggs in moderation (due to cholesterol)  Follow Up Plan: Telephone follow up appointment with care management team member scheduled for:  5 months       

## 2021-06-16 ENCOUNTER — Other Ambulatory Visit (HOSPITAL_BASED_OUTPATIENT_CLINIC_OR_DEPARTMENT_OTHER): Payer: Medicare HMO

## 2021-07-16 ENCOUNTER — Other Ambulatory Visit: Payer: Self-pay | Admitting: Family Medicine

## 2021-07-16 DIAGNOSIS — I10 Essential (primary) hypertension: Secondary | ICD-10-CM

## 2021-07-20 ENCOUNTER — Ambulatory Visit: Payer: Medicare HMO | Admitting: Family Medicine

## 2021-08-16 ENCOUNTER — Other Ambulatory Visit: Payer: Self-pay | Admitting: Family Medicine

## 2021-08-16 DIAGNOSIS — I10 Essential (primary) hypertension: Secondary | ICD-10-CM

## 2021-08-17 ENCOUNTER — Other Ambulatory Visit: Payer: Medicare HMO

## 2021-09-19 ENCOUNTER — Encounter: Payer: Self-pay | Admitting: Family Medicine

## 2021-09-19 ENCOUNTER — Other Ambulatory Visit: Payer: Self-pay

## 2021-09-19 ENCOUNTER — Other Ambulatory Visit: Payer: Self-pay | Admitting: Family Medicine

## 2021-09-19 ENCOUNTER — Ambulatory Visit (INDEPENDENT_AMBULATORY_CARE_PROVIDER_SITE_OTHER): Payer: Medicare HMO | Admitting: Family Medicine

## 2021-09-19 VITALS — BP 127/62 | HR 64 | Ht 64.0 in | Wt 166.0 lb

## 2021-09-19 DIAGNOSIS — F5101 Primary insomnia: Secondary | ICD-10-CM

## 2021-09-19 DIAGNOSIS — Z23 Encounter for immunization: Secondary | ICD-10-CM | POA: Diagnosis not present

## 2021-09-19 DIAGNOSIS — I10 Essential (primary) hypertension: Secondary | ICD-10-CM | POA: Diagnosis not present

## 2021-09-19 DIAGNOSIS — E876 Hypokalemia: Secondary | ICD-10-CM | POA: Diagnosis not present

## 2021-09-19 DIAGNOSIS — J3089 Other allergic rhinitis: Secondary | ICD-10-CM

## 2021-09-19 DIAGNOSIS — N182 Chronic kidney disease, stage 2 (mild): Secondary | ICD-10-CM | POA: Diagnosis not present

## 2021-09-19 MED ORDER — ZOLPIDEM TARTRATE 5 MG PO TABS: ORAL_TABLET | ORAL | 3 refills | Status: DC

## 2021-09-19 MED ORDER — SERTRALINE HCL 25 MG PO TABS: 25.0000 mg | ORAL_TABLET | Freq: Every day | ORAL | 1 refills | Status: DC

## 2021-09-19 MED ORDER — METOPROLOL SUCCINATE ER 25 MG PO TB24: 25.0000 mg | ORAL_TABLET | Freq: Every day | ORAL | 1 refills | Status: DC

## 2021-09-19 MED ORDER — ZOLPIDEM TARTRATE 5 MG PO TABS: 5.0000 mg | ORAL_TABLET | Freq: Every evening | ORAL | 3 refills | Status: DC | PRN

## 2021-09-19 NOTE — Assessment & Plan Note (Signed)
Did refill her medication today.  Tolerating well.

## 2021-09-19 NOTE — Assessment & Plan Note (Signed)
Following renal function every 6 months. 

## 2021-09-19 NOTE — Progress Notes (Signed)
Established Patient Office Visit  Subjective:  Patient ID: Belinda Owens, female    DOB: Dec 21, 1931  Age: 87 y.o. MRN: [id number]  CC:  Chief Complaint  Patient presents with   Follow-up         HPI Belinda Owens presents for   Hypertension- Pt denies chest pain, SOB, dizziness, or heart palpitations.  Taking meds as directed w/o problems.  Denies medication side effects.    Due to recheck lipids she has been taking her pravastatin 20 mg daily.  But last LDL was out of goal.  Allergic rhinitis-she says her allergies have been pretty severe lately she usually will take Benadryl and Allegra but does not feel like it is helping a whole lot.  She is doing well using her Ambien she denies any side effects or excess sedation.  She does need refills today.  Mood-taking her sertraline.  Past Medical History:  Diagnosis Date   Fibrocystic breast changes of both breasts    Gets yearly mammo and [place]   History of rheumatic fever     Past Surgical History:  Procedure Laterality Date   BREAST BIOPSY     MENISCUS REPAIR Right    RECONSTRUCTION OF EYELID Bilateral 12/14/2016   ROTATOR CUFF REPAIR Right     Family History  Problem Relation Age of Onset   Breast cancer Maternal Aunt    Colon cancer Father    Asthma Daughter    Heart disease Mother    Uterine cancer Mother    Cancer - Other Brother     Social History   Socioeconomic History   Marital status: Divorced    Spouse name: Not on file   Number of children: 3   Years of education: 13   Highest education level: Some college, no degree  Occupational History   Occupation: retired.     Comment: [employer]  Tobacco Use   Smoking status: Former    Types: Cigarettes    Quit date: 10/23/1957    Years since quitting: 63.9   Smokeless tobacco: Never  Vaping Use   Vaping Use: Never used  Substance and Sexual Activity   Alcohol use: Not Currently    Alcohol/week: 0.0 standard drinks   Drug use: No   Sexual  activity: Never    Partners: Male    Birth control/protection: None  Other Topics Concern   Not on file  Social History Narrative   Drinks decaf coffee.  Quit smoking 1959. Lives alone. Had three children, one has deceased.   Social Determinants of Health   Financial Resource Strain: Low Risk    Difficulty of Paying Living Expenses: Not hard at all  Food Insecurity: No Food Insecurity   Worried About Charity fundraiser in the Last Year: Never true   [place] in the Last Year: Never true  Transportation Needs: No Transportation Needs   Lack of Transportation (Medical): No   Lack of Transportation (Non-Medical): No  Physical Activity: Inactive   Days of Exercise per Week: 0 days   Minutes of Exercise per Session: 0 min  Stress: No Stress Concern Present   Feeling of Stress : Not at all  Social Connections: Moderately Integrated   Frequency of Communication with Friends and Family: More than three times a week   Frequency of Social Gatherings with Friends and Family: Twice a week   Attends Religious Services: More than 4 times per year   Active Member of [employer] or Organizations: Yes  Attends Music therapist: More than 4 times per year   Marital Status: Divorced  Human resources officer Violence: Not At Risk   Fear of Current or Ex-Partner: No   Emotionally Abused: No   Physically Abused: No   Sexually Abused: No    Outpatient Medications Prior to Visit  Medication Sig Dispense Refill   amLODipine (NORVASC) 5 MG tablet TAKE ONE TABLET BY MOUTH DAILY 90 tablet 1   Calcium Carbonate-Vitamin D (CALCIUM 600+D PO) Take 1 tablet by mouth daily.     Cholecalciferol (VITAMIN D3) 1.25 MG (50000 UT) CAPS Take 1 capsule by mouth daily.     losartan-hydrochlorothiazide (HYZAAR) 100-25 MG tablet TAKE ONE TABLET BY MOUTH DAILY 90 tablet 1   Multiple Vitamins-Minerals (CENTRUM SILVER 50+WOMEN PO) Take 1 tablet by mouth daily.     Multiple Vitamins-Minerals (ZINC PO) Take 50 mg  by mouth daily.     pravastatin (PRAVACHOL) 20 MG tablet Take 1 tablet (20 mg total) by mouth at bedtime. 90 tablet 3   vitamin B-12 (CYANOCOBALAMIN) 500 MCG tablet Take 500 mcg by mouth daily.     albuterol (PROVENTIL HFA;VENTOLIN HFA) 108 (90 Base) MCG/ACT inhaler Inhale 2 puffs into the lungs every 6 (six) hours as needed for wheezing or shortness of breath. (Patient not taking: Reported on 06/09/2021) 18 g 1   aspirin 81 MG tablet Take 81 mg by mouth daily. (Patient not taking: Reported on 06/09/2021)     metoprolol succinate (TOPROL-XL) 25 MG 24 hr tablet TAKE ONE TABLET BY MOUTH DAILY 90 tablet 0   sertraline (ZOLOFT) 25 MG tablet TAKE ONE TABLET BY MOUTH DAILY 90 tablet 1   zolpidem (AMBIEN) 5 MG tablet TAKE ONE TABLET BY MOUTH EVERY NIGHT AT BEDTIME AS NEEDED FOR SLEEP 30 tablet 1   No facility-administered medications prior to visit.    Allergies  Allergen Reactions   Atenolol Other (See Comments) and Rash    Headache Headache   Atorvastatin Other (See Comments)    Myalgia   Fluoxetine Other (See Comments) and Rash    Tearfulness   Omeprazole Itching   Tylenol With Codeine #3  [Acetaminophen-Codeine] Nausea And Vomiting    ROS Review of Systems    Objective:    Physical Exam Constitutional:      Appearance: Normal appearance. She is well-developed.  HENT:     Head: Normocephalic and atraumatic.  Cardiovascular:     Rate and Rhythm: Normal rate and regular rhythm.     Heart sounds: Normal heart sounds.  Pulmonary:     Effort: Pulmonary effort is normal.     Breath sounds: Normal breath sounds.  Skin:    General: Skin is warm and dry.  Neurological:     Mental Status: She is alert and oriented to person, place, and time.  Psychiatric:        Behavior: Behavior normal.    BP 127/62   Pulse 64   Ht 5\' 4"  (1.626 m)   Wt 166 lb (75.3 kg)   SpO2 96%   BMI 28.49 kg/m  Wt Readings from Last 3 Encounters:  09/19/21 166 lb (75.3 kg)  05/17/21 167 lb 0.3 oz (75.8  kg)  05/10/21 164 lb (74.4 kg)     Health Maintenance Due  Topic Date Due   Zoster Vaccines- Shingrix (1 of 2) Never done   COVID-19 Vaccine (4 - Booster) 09/09/2020    There are no preventive care reminders to display for this patient.  Lab  Results  Component Value Date   TSH 2.43 02/19/2018   Lab Results  Component Value Date   WBC 6.7 01/10/2021   HGB 14.2 01/10/2021   HCT 42.3 01/10/2021   MCV 86.5 01/10/2021   PLT 260 01/10/2021   Lab Results  Component Value Date   NA 139 04/22/2021   K 3.9 04/22/2021   CO2 23 04/22/2021   GLUCOSE 99 04/22/2021   BUN 16 04/22/2021   CREATININE 0.83 04/22/2021   BILITOT 0.5 01/10/2021   ALKPHOS 56 07/06/2016   AST 16 01/10/2021   ALT 11 01/10/2021   PROT 7.1 01/10/2021   ALBUMIN 4.3 07/06/2016   CALCIUM 10.6 (H) 04/22/2021   Lab Results  Component Value Date   CHOL 251 (H) 01/10/2021   Lab Results  Component Value Date   HDL 73 01/10/2021   Lab Results  Component Value Date   LDLCALC 147 (H) 01/10/2021   Lab Results  Component Value Date   TRIG 174 (H) 01/10/2021   Lab Results  Component Value Date   CHOLHDL 3.4 01/10/2021   No results found for: HGBA1C    Assessment & Plan:   Problem List Items Addressed This Visit       Cardiovascular and Mediastinum   Essential hypertension    Well controlled. Continue current regimen. Follow up in  6 mo       Relevant Medications   sertraline (ZOLOFT) 25 MG tablet   zolpidem (AMBIEN) 5 MG tablet   metoprolol succinate (TOPROL-XL) 25 MG 24 hr tablet   Other Relevant Orders   Lipid Panel w/reflex Direct LDL   COMPLETE METABOLIC PANEL WITH GFR   CBC     Genitourinary   CKD (chronic kidney disease) stage 2, GFR 60-89 ml/min    Following renal function every 6 months.        Other   Insomnia    Did refill her medication today.  Tolerating well.      Other Visit Diagnoses     Need for immunization against influenza    -  Primary   Relevant Orders    Flu Vaccine QUAD High Dose(Fluad) (Completed)   Hypokalemia       Relevant Medications   sertraline (ZOLOFT) 25 MG tablet   zolpidem (AMBIEN) 5 MG tablet   Other Relevant Orders   Lipid Panel w/reflex Direct LDL   COMPLETE METABOLIC PANEL WITH GFR   CBC   Non-seasonal allergic rhinitis, unspecified trigger           Allergic rhinitis-discussed could consider restarting a nasal steroid spray she says they are quite costly and does not feel like they necessarily work any better than the oral antihistamine.  Could even try it one of the new Astelin products which is now over-the-counter as well.  Meds ordered this encounter  Medications   sertraline (ZOLOFT) 25 MG tablet    Sig: Take 1 tablet (25 mg total) by mouth daily.    Dispense:  90 tablet    Refill:  1   DISCONTD: zolpidem (AMBIEN) 5 MG tablet    Sig: TAKE ONE TABLET BY MOUTH EVERY NIGHT AT BEDTIME AS NEEDED FOR SLEEP Strength: 5 mg    Dispense:  30 tablet    Refill:  3   zolpidem (AMBIEN) 5 MG tablet    Sig: Take 1 tablet (5 mg total) by mouth at bedtime as needed for sleep.    Dispense:  30 tablet    Refill:  3  metoprolol succinate (TOPROL-XL) 25 MG 24 hr tablet    Sig: Take 1 tablet (25 mg total) by mouth daily.    Dispense:  90 tablet    Refill:  1    Follow-up: Return in about 6 months (around 03/19/2022) for BP and mood.  Beatrice Lecher, MD

## 2021-09-19 NOTE — Assessment & Plan Note (Signed)
Well controlled. Continue current regimen. Follow up in  6 mo  

## 2021-09-20 DIAGNOSIS — E876 Hypokalemia: Secondary | ICD-10-CM | POA: Diagnosis not present

## 2021-09-20 DIAGNOSIS — I10 Essential (primary) hypertension: Secondary | ICD-10-CM | POA: Diagnosis not present

## 2021-09-20 LAB — CBC
HCT: 43.2 % (ref 35.0–45.0)
Hemoglobin: 14.3 g/dL (ref 11.7–15.5)
MCH: 28.8 pg (ref 27.0–33.0)
MCHC: 33.1 g/dL (ref 32.0–36.0)
MCV: 87.1 fL (ref 80.0–100.0)
MPV: 10.3 fL (ref 7.5–12.5)
Platelets: 283 10*3/uL (ref 140–400)
RBC: 4.96 10*6/uL (ref 3.80–5.10)
RDW: 13.4 % (ref 11.0–15.0)
WBC: 7.5 10*3/uL (ref 3.8–10.8)

## 2021-09-20 LAB — LIPID PANEL W/REFLEX DIRECT LDL
Cholesterol: 198 mg/dL (ref <200)
HDL: 69 mg/dL (ref >=50)
LDL Cholesterol (Calc): 103 mg/dL (calc) — ABNORMAL HIGH
Non-HDL Cholesterol (Calc): 129 mg/dL (calc) (ref <130)
Total CHOL/HDL Ratio: 2.9 (calc) (ref <5.0)
Triglycerides: 159 mg/dL — ABNORMAL HIGH (ref <150)

## 2021-09-20 LAB — COMPLETE METABOLIC PANEL WITH GFR
AG Ratio: 1.8 (calc) (ref 1.0–2.5)
ALT: 16 U/L (ref 6–29)
AST: 20 U/L (ref 10–35)
Albumin: 4.6 g/dL (ref 3.6–5.1)
Alkaline phosphatase (APISO): 51 U/L (ref 37–153)
BUN: 23 mg/dL (ref 7–25)
CO2: 29 mmol/L (ref 20–32)
Calcium: 10.2 mg/dL (ref 8.6–10.4)
Chloride: 102 mmol/L (ref 98–110)
Creat: 0.93 mg/dL (ref 0.60–0.95)
Globulin: 2.6 g/dL (calc) (ref 1.9–3.7)
Glucose, Bld: 107 mg/dL — ABNORMAL HIGH (ref 65–99)
Potassium: 3.8 mmol/L (ref 3.5–5.3)
Sodium: 141 mmol/L (ref 135–146)
Total Bilirubin: 0.7 mg/dL (ref 0.2–1.2)
Total Protein: 7.2 g/dL (ref 6.1–8.1)
eGFR: 60 mL/min/{1.73_m2} (ref >=60)

## 2021-09-21 NOTE — Progress Notes (Signed)
Patient: Cholesterol looks much better.  LDL went from 147 down to 103 which is fantastic.  The triglycerides are also better.  Liver and kidney function looks stable.  Blood count is normal.

## 2021-11-01 ENCOUNTER — Ambulatory Visit (INDEPENDENT_AMBULATORY_CARE_PROVIDER_SITE_OTHER): Payer: Medicare HMO | Admitting: Pharmacist

## 2021-11-01 ENCOUNTER — Other Ambulatory Visit: Payer: Self-pay

## 2021-11-01 DIAGNOSIS — E785 Hyperlipidemia, unspecified: Secondary | ICD-10-CM

## 2021-11-01 DIAGNOSIS — I10 Essential (primary) hypertension: Secondary | ICD-10-CM

## 2021-11-01 NOTE — Progress Notes (Signed)
Chronic Care Management Pharmacy Note  11/01/2021 Name:  Belinda Owens MRN:  [id number] DOB:  1932/06/27  Summary: addressed HTN, HLD. Reviewed cholesterol trends, which have improved! Blood pressure remains at goal.  Recommendations/Changes made from today's visit: none, patient doing well  Plan: f/u with pharmacist in 6-8 months  Subjective: Belinda Owens is an 88 y.o. year old female who is a primary patient of Metheney, Rene Kocher, MD.  The CCM team was consulted for assistance with disease management and care coordination needs.    Engaged with patient by telephone for follow up visit in response to provider referral for pharmacy case management and/or care coordination services.   Consent to Services:  The patient was given information about Chronic Care Management services, agreed to services, and gave verbal consent prior to initiation of services.  Please see initial visit note for detailed documentation.   Patient Care Team: Hali Marry, MD as PCP - General (Family Medicine) Darius Bump, [facility] as Pharmacist (Pharmacist)  Recent office visits:  05/17/21-Belinda D. Madilyn Fireman, MD (PCP) Hospital follow up. Labs ordered. 05/10/21-Belinda D. Madilyn Fireman, MD (PCP) Seen for a fall. Start Augmentin 875-125 mg tab. 04/22/21-Belinda Owens. Mass of leg. X-rays ordered and MRI ordered. 03/16/21-Belinda D. Madilyn Fireman, MD (PCP) Seen for Sinus pressure. Start Doxycycline 100 mg tabs. 03/08/21-Belinda D. Madilyn Fireman, MD (PCP) Curley Spice or Sinusitis and Migraine. Start on Prednisone 10 mg.  03/03/21-Belinda B. Beck, NP(Video Visit) Seen for Upper respiratory infection. Return if symtoms worsen or fail to improve. 01/10/21-Belinda D. Madilyn Fireman, MD (PCP) Seen for Hypertension. Labs ordered. Follow up in 6 months.   Recent consult visits:  12/08/20-Belinda Owens ([facility]). Notes not available. 12/06/20-Belinda Owens [facility]) Notes not available.   Hospital visits:  Medication Reconciliation was completed by comparing discharge summary, patients EMR and Pharmacy list, and upon discussion with patient.   Admitted to the hospital on 05/11/21 due to Medical screening. Discharge date was 05/11/21. Discharged from [facility]?Medications Started at [facility] Discharge:?? -started None noted   Medication Changes at Hospital Discharge: -Changed None noted   Medications Discontinued at Hospital Discharge: -Stopped None noted   Medications that remain the same after Hospital Discharge:??  -All other medications will remain the same.       Medication Reconciliation was completed by comparing discharge summary, patients EMR and Pharmacy list, and upon discussion with patient.   Admitted to the hospital on 05/09/21 due to Fall. Discharge date was 05/09/21. Discharged from [facility]?Medications Started at [facility] Discharge:?? -started none noted   Medication Changes at Hospital Discharge: -Changed none noted   Medications Discontinued at Hospital Discharge: -Stopped none noted   Medications that remain the same after Hospital Discharge:??  -All other medications will remain the same.    Objective:  Lab Results  Component Value Date   CREATININE 0.93 09/20/2021   CREATININE 0.83 04/22/2021   CREATININE 0.99 (H) 01/10/2021        Component Value Date/Time   CHOL 198 09/20/2021 0000   TRIG 159 (H) 09/20/2021 0000   HDL 69 09/20/2021 0000   CHOLHDL 2.9 09/20/2021 0000   VLDL 31 (H) 07/06/2016 1432   LDLCALC 103 (H) 09/20/2021 0000    Hepatic Function Latest Ref Rng & Units 09/20/2021 01/10/2021 01/07/2020  Total Protein 6.1 - 8.1 g/dL 7.2 7.1 7.3  Albumin 3.6 - 5.1 g/dL - - -  AST 10 - 35 U/L 20 16 27   ALT 6 - 29 U/L 16 11 28   Alk Phosphatase 33 - 130 U/L - - -  Total Bilirubin 0.2 - 1.2 mg/dL 0.7 0.5 0.5   Bilirubin, Direct <=0.2 mg/dL - - -    Lab Results  Component Value Date/Time   TSH 2.43 02/19/2018 03:28 PM   TSH 2.023 11/04/2015 10:46 AM    CBC Latest Ref Rng & Units 09/20/2021 01/10/2021 02/19/2018  WBC 3.8 - 10.8 Thousand/uL 7.5 6.7 6.6  Hemoglobin 11.7 - 15.5 g/dL 14.3 14.2 13.9  Hematocrit 35.0 - 45.0 % 43.2 42.3 40.3  Platelets 140 - 400 Thousand/uL 283 260 276    Social History   Tobacco Use  Smoking Status Former   Types: Cigarettes   Quit date: 10/23/1957   Years since quitting: 64.0  Smokeless Tobacco Never   BP Readings from Last 3 Encounters:  09/19/21 127/62  05/17/21 119/64  05/10/21 (!) 150/65   Pulse Readings from Last 3 Encounters:  09/19/21 64  05/17/21 62  05/10/21 67   Wt Readings from Last 3 Encounters:  09/19/21 166 lb (75.3 kg)  05/17/21 167 lb 0.3 oz (75.8 kg)  05/10/21 164 lb (74.4 kg)    Assessment: Review of patient past medical history, allergies, medications, health status, including review of consultants reports, laboratory and other test data, was performed as part of comprehensive evaluation and provision of chronic care management services.   SDOH:  (Social Determinants of Health) assessments and interventions performed:    CCM Care Plan  Allergies  Allergen Reactions   Atenolol Other (See Comments) and Rash    Headache Headache   Atorvastatin Other (See Comments)    Myalgia   Fluoxetine Other (See Comments) and Rash    Tearfulness   Omeprazole Itching   Tylenol With Codeine #3  [Acetaminophen-Codeine] Nausea And Vomiting    Medications Reviewed Today     Reviewed by Hali Marry, MD (Physician) on 09/19/21 at [facility] List Status: <None>   Medication Order Taking? Sig Documenting Provider Last Dose Status Informant  amLODipine (NORVASC) 5 MG tablet [id number]  TAKE ONE TABLET BY MOUTH DAILY Hali Marry, MD  Active   Calcium Carbonate-Vitamin D (CALCIUM 600+D PO) [id number] No Take 1 tablet by  mouth daily. [provider] Taking Active   Cholecalciferol (VITAMIN D3) 1.25 MG (50000 UT) CAPS [id number] No Take 1 capsule by mouth daily. [provider] Taking Active   losartan-hydrochlorothiazide (HYZAAR) 100-25 MG tablet [id number]  TAKE ONE TABLET BY MOUTH DAILY Hali Marry, MD  Active   metoprolol succinate (TOPROL-XL) 25 MG 24 hr tablet [id number]  Take 1 tablet (25 mg total) by mouth daily. Hali Marry, MD  Active   Multiple Vitamins-Minerals (CENTRUM SILVER 50+WOMEN PO) [id number] No Take 1 tablet by mouth daily. [provider] Taking Active   Multiple Vitamins-Minerals (ZINC PO) [id number] No Take 50 mg by mouth daily. [provider] Taking Active   pravastatin (PRAVACHOL) 20 MG tablet [id number] No Take 1 tablet (20 mg total) by mouth at bedtime. Hali Marry, MD Taking Active   sertraline (ZOLOFT) 25 MG tablet [id number]  Take 1 tablet (25 mg total) by mouth daily. Hali Marry, MD  Active   vitamin B-12 (CYANOCOBALAMIN) 500 MCG tablet [id number] No Take 500 mcg by mouth daily. [provider] Taking Active   zolpidem (AMBIEN) 5 MG tablet [id number]  Take 1 tablet (5 mg total) by  mouth at bedtime as needed for sleep. Hali Marry, MD  Active             Patient Active Problem List   Diagnosis Date Noted   Mass of leg, left 04/22/2021   Migraine 03/08/2021   History of TIA (transient ischemic attack) 01/10/2021   Primary osteoarthritis of right wrist 10/02/2018   De Quervain's tenosynovitis, right 10/02/2018   CKD (chronic kidney disease) stage 2, GFR 60-89 ml/min 08/18/2018   Myalgia due to statin 02/19/2018   Adenoma of duodenum 04/10/2017   Dermatochalasis of both upper eyelids 12/14/2016   Atypical ductal hyperplasia of left breast 12/01/2016   Sedentary lifestyle 12/01/2016   Class 1 drug-induced obesity without serious comorbidity with body mass index (BMI) of 31.0 to 31.9 in adult  12/01/2016   At risk for breast cancer 12/01/2016   Primary osteoarthritis of left knee 06/08/2016   Osteopenia 09/29/2015   Trochanteric bursitis of right hip 05/05/2015   Ear ringing sound 04/27/2015   Bilateral shoulder pain 03/16/2015   Degenerative disc disease, cervical 03/16/2015   Insomnia 03/09/2015   Hyperlipidemia 02/03/2015   Essential hypertension 02/03/2015   DDD (degenerative disc disease), lumbar 02/03/2015   GERD (gastroesophageal reflux disease) 02/03/2015   Anxiety state 02/03/2015   Fibrocystic breast changes of both breasts 02/03/2015   Microscopic hematuria 02/03/2015   Allergic bronchitis 02/03/2015    Immunization History  Administered Date(s) Administered   Fluad Quad(high Dose 65+) 07/15/2019, 09/19/2021   Influenza, High Dose Seasonal PF 07/16/2017, 07/17/2018   Influenza,inj,Quad PF,6+ Mos 08/05/2015, 07/06/2016   Influenza-Unspecified 07/15/2020   Moderna Sars-Covid-2 Vaccination 01/01/2020, 02/03/2020   PFIZER(Purple Top)SARS-COV-2 Vaccination 07/15/2020   Pneumococcal Conjugate-13 08/19/2015   Pneumococcal Polysaccharide-23 01/04/2017   Tdap 03/23/2014    Conditions to be addressed/monitored: HTN and HLD  There are no care plans that you recently modified to display for this patient.    Medication Assistance: None required.  Patient affirms current coverage meets needs.  Patient's preferred pharmacy is:  [id number] - [place], [address] [place] [address] Phone: [phone] Fax: [phone]   Uses pill box? Yes Pt endorses 100% compliance  Follow Up:  Patient agrees to Care Plan and Follow-up.  Plan: Telephone follow up appointment with care management team member scheduled for:  6-8 months  Larinda Buttery, PharmD Clinical Pharmacist [facility] [phone]

## 2021-11-01 NOTE — Patient Instructions (Signed)
Visit Information  Thank you for taking time to visit with me today. Please don't hesitate to contact me if I can be of assistance to you before our next scheduled telephone appointment.  Following are the goals we discussed today:   Patient Goals/Self-Care Activities Over the next 180 days, patient will:  take medications as prescribed   Follow Up Plan: Telephone follow up appointment with care management team member scheduled for:  6-8 months   Please call the care guide team at [phone] if you need to cancel or reschedule your appointment.   The patient verbalized understanding of instructions, educational materials, and care plan provided today and agreed to receive a mailed copy of patient instructions, educational materials, and care plan.   Belinda Owens

## 2021-11-22 DIAGNOSIS — I10 Essential (primary) hypertension: Secondary | ICD-10-CM

## 2021-11-22 DIAGNOSIS — E785 Hyperlipidemia, unspecified: Secondary | ICD-10-CM

## 2021-11-25 DIAGNOSIS — N6092 Unspecified benign mammary dysplasia of left breast: Secondary | ICD-10-CM | POA: Diagnosis not present

## 2021-11-25 DIAGNOSIS — Z9189 Other specified personal risk factors, not elsewhere classified: Secondary | ICD-10-CM | POA: Diagnosis not present

## 2021-11-29 ENCOUNTER — Ambulatory Visit (INDEPENDENT_AMBULATORY_CARE_PROVIDER_SITE_OTHER): Payer: Medicare HMO | Admitting: Sports Medicine

## 2021-11-29 ENCOUNTER — Other Ambulatory Visit: Payer: Self-pay

## 2021-11-29 DIAGNOSIS — J0100 Acute maxillary sinusitis, unspecified: Secondary | ICD-10-CM | POA: Insufficient documentation

## 2021-11-29 MED ORDER — IPRATROPIUM BROMIDE 0.06 % NA SOLN: 2.0000 | Freq: Four times a day (QID) | NASAL | 11 refills | Status: DC

## 2021-11-29 MED ORDER — AZITHROMYCIN 250 MG PO TABS
ORAL_TABLET | ORAL | 0 refills | Status: DC
Start: 2021-11-29 — End: 2021-12-09

## 2021-11-29 MED ORDER — PREDNISONE 50 MG PO TABS: ORAL_TABLET | ORAL | 0 refills | Status: DC

## 2021-11-29 NOTE — Progress Notes (Signed)
° ° °  Procedures performed today:    None.  Independent interpretation of notes and tests performed by another provider:   None.  Brief History, Exam, Impression, and Recommendations:    Acute maxillary sinusitis Multiple days of runny nose, facial pain and pressure, malaise. Boggy and erythematous nasal turbinates, tenderness over the maxillary sinuses, no cough, no fevers, no constitutional symptoms. Sending in azithromycin, prednisone, nasal Atrovent, return to see me as needed.    ___________________________________________ Gwen Her. Dianah Field, M.D., ABFM., CAQSM. Primary Care and [place] Instructor of [place] [employer] [facility] of Medicine

## 2021-11-29 NOTE — Assessment & Plan Note (Signed)
Multiple days of runny nose, facial pain and pressure, malaise. Boggy and erythematous nasal turbinates, tenderness over the maxillary sinuses, no cough, no fevers, no constitutional symptoms. Sending in azithromycin, prednisone, nasal Atrovent, return to see me as needed.

## 2021-12-09 ENCOUNTER — Ambulatory Visit (INDEPENDENT_AMBULATORY_CARE_PROVIDER_SITE_OTHER): Payer: Medicare HMO | Admitting: Family Medicine

## 2021-12-09 ENCOUNTER — Encounter: Payer: Self-pay | Admitting: Family Medicine

## 2021-12-09 ENCOUNTER — Other Ambulatory Visit: Payer: Self-pay

## 2021-12-09 VITALS — BP 137/69 | HR 67 | Resp 16 | Ht 64.0 in | Wt 167.0 lb

## 2021-12-09 DIAGNOSIS — J3489 Other specified disorders of nose and nasal sinuses: Secondary | ICD-10-CM

## 2021-12-09 MED ORDER — FLUTICASONE PROPIONATE 50 MCG/ACT NA SUSP
1.0000 | Freq: Every day | NASAL | 5 refills | Status: DC
Start: 2021-12-09 — End: 2024-02-06

## 2021-12-09 NOTE — Progress Notes (Signed)
Acute Office Visit  Subjective:    Patient ID: Belinda Owens, female    DOB: 1932-07-28, 88 y.o.   MRN: [id number]  Chief Complaint  Patient presents with   Sinus Problem    Head pressure, No energy. Patient states she completed the Prednisone and Zpak prescribed by Dr. Darene Lamer last week.     HPI Patient is in today for feeling lightheaded.  She was seen 10 days ago for a sinus infection and treated with antibiotics and prednisone.  She says now she still just feels a little off she has a lot of pressure in her frontal forehead area.  Just feels tired with no energy.  She also has some congestion but she is not actually producing a lot of drainage though today she started getting a little bit of a runny nose.  No fevers or chills.  No cough.  She does have a prescription for Atrovent nasal spray but says she quit using it because it burns.  Past Medical History:  Diagnosis Date   Fibrocystic breast changes of both breasts    Gets yearly mammo and [place]   History of rheumatic fever     Past Surgical History:  Procedure Laterality Date   BREAST BIOPSY     MENISCUS REPAIR Right    RECONSTRUCTION OF EYELID Bilateral 12/14/2016   ROTATOR CUFF REPAIR Right     Family History  Problem Relation Age of Onset   Breast cancer Maternal Aunt    Colon cancer Father    Asthma Daughter    Heart disease Mother    Uterine cancer Mother    Cancer - Other Brother     Social History   Socioeconomic History   Marital status: Divorced    Spouse name: Not on file   Number of children: 3   Years of education: 13   Highest education level: Some college, no degree  Occupational History   Occupation: retired.     Comment: [employer]  Tobacco Use   Smoking status: Former    Types: Cigarettes    Quit date: 10/23/1957    Years since quitting: 64.1   Smokeless tobacco: Never  Vaping Use   Vaping Use: Never used  Substance and Sexual Activity   Alcohol use: Not Currently    Alcohol/week: 0.0  standard drinks   Drug use: No   Sexual activity: Never    Partners: Male    Birth control/protection: None  Other Topics Concern   Not on file  Social History Narrative   Drinks decaf coffee.  Quit smoking 1959. Lives alone. Had three children, one has deceased.   Social Determinants of Health   Financial Resource Strain: Low Risk    Difficulty of Paying Living Expenses: Not hard at all  Food Insecurity: No Food Insecurity   Worried About Charity fundraiser in the Last Year: Never true   Otho in the Last Year: Never true  Transportation Needs: No Transportation Needs   Lack of Transportation (Medical): No   Lack of Transportation (Non-Medical): No  Physical Activity: Inactive   Days of Exercise per Week: 0 days   Minutes of Exercise per Session: 0 min  Stress: No Stress Concern Present   Feeling of Stress : Not at all  Social Connections: Moderately Integrated   Frequency of Communication with Friends and Family: More than three times a week   Frequency of Social Gatherings with Friends and Family: Twice a week  Attends Religious Services: More than 4 times per year   Active Member of Clubs or Organizations: Yes   Attends Archivist Meetings: More than 4 times per year   Marital Status: Divorced  Human resources officer Violence: Not At Risk   Fear of Current or Ex-Partner: No   Emotionally Abused: No   Physically Abused: No   Sexually Abused: No    Outpatient Medications Prior to Visit  Medication Sig Dispense Refill   amLODipine (NORVASC) 5 MG tablet TAKE ONE TABLET BY MOUTH DAILY 90 tablet 1   Calcium Carbonate-Vitamin D (CALCIUM 600+D PO) Take 1 tablet by mouth daily.     Cholecalciferol (VITAMIN D3) 1.25 MG (50000 UT) CAPS Take 1 capsule by mouth daily.     losartan-hydrochlorothiazide (HYZAAR) 100-25 MG tablet TAKE ONE TABLET BY MOUTH DAILY 90 tablet 1   metoprolol succinate (TOPROL-XL) 25 MG 24 hr tablet Take 1 tablet (25 mg total) by mouth daily.  90 tablet 1   Multiple Vitamins-Minerals (CENTRUM SILVER 50+WOMEN PO) Take 1 tablet by mouth daily.     Multiple Vitamins-Minerals (ZINC PO) Take 50 mg by mouth daily.     pravastatin (PRAVACHOL) 20 MG tablet Take 1 tablet (20 mg total) by mouth at bedtime. 90 tablet 3   sertraline (ZOLOFT) 25 MG tablet Take 1 tablet (25 mg total) by mouth daily. 90 tablet 1   vitamin B-12 (CYANOCOBALAMIN) 500 MCG tablet Take 500 mcg by mouth daily.     zolpidem (AMBIEN) 5 MG tablet Take 1 tablet (5 mg total) by mouth at bedtime as needed for sleep. 30 tablet 3   azithromycin (ZITHROMAX Z-PAK) 250 MG tablet Take 2 tablets (500 mg) on  Day 1,  followed by 1 tablet (250 mg) once daily on Days 2 through 5. 6 tablet 0   ipratropium (ATROVENT) 0.06 % nasal spray Place 2 sprays into both nostrils 4 (four) times daily. 15 mL 11   predniSONE (DELTASONE) 50 MG tablet One tab PO daily for 5 days. 5 tablet 0   No facility-administered medications prior to visit.    Allergies  Allergen Reactions   Atenolol Other (See Comments) and Rash    Headache Headache   Atorvastatin Other (See Comments)    Myalgia   Fluoxetine Other (See Comments) and Rash    Tearfulness   Omeprazole Itching   Tylenol With Codeine #3  [Acetaminophen-Codeine] Nausea And Vomiting   Atrovent Nasal Spray [Ipratropium] Other (See Comments)    burning    Review of Systems     Objective:    Physical Exam Constitutional:      Appearance: She is well-developed.  HENT:     Head: Normocephalic and atraumatic.     Right Ear: External ear normal.     Left Ear: External ear normal.     Nose: Nose normal.  Eyes:     Conjunctiva/sclera: Conjunctivae normal.     Pupils: Pupils are equal, round, and reactive to light.  Neck:     Thyroid: No thyromegaly.  Cardiovascular:     Rate and Rhythm: Normal rate and regular rhythm.     Heart sounds: Normal heart sounds.  Pulmonary:     Effort: Pulmonary effort is normal.     Breath sounds: Normal  breath sounds. No wheezing.  Musculoskeletal:     Cervical back: Neck supple.  Lymphadenopathy:     Cervical: No cervical adenopathy.  Skin:    General: Skin is warm and dry.  Neurological:  Mental Status: She is alert and oriented to person, place, and time.    BP 137/69    Pulse 67    Resp 16    Ht 5' 4"  (1.626 m)    Wt 167 lb (75.8 kg)    SpO2 98%    BMI 28.67 kg/m  Wt Readings from Last 3 Encounters:  12/09/21 167 lb (75.8 kg)  11/29/21 162 lb 1.9 oz (73.5 kg)  09/19/21 166 lb (75.3 kg)    There are no preventive care reminders to display for this patient.   There are no preventive care reminders to display for this patient.   Lab Results  Component Value Date   TSH 2.43 02/19/2018   Lab Results  Component Value Date   WBC 7.5 09/20/2021   HGB 14.3 09/20/2021   HCT 43.2 09/20/2021   MCV 87.1 09/20/2021   PLT 283 09/20/2021   Lab Results  Component Value Date   NA 141 09/20/2021   K 3.8 09/20/2021   CO2 29 09/20/2021   GLUCOSE 107 (H) 09/20/2021   BUN 23 09/20/2021   CREATININE 0.93 09/20/2021   BILITOT 0.7 09/20/2021   ALKPHOS 56 07/06/2016   AST 20 09/20/2021   ALT 16 09/20/2021   PROT 7.2 09/20/2021   ALBUMIN 4.3 07/06/2016   CALCIUM 10.2 09/20/2021   EGFR 60 09/20/2021   Lab Results  Component Value Date   CHOL 198 09/20/2021   Lab Results  Component Value Date   HDL 69 09/20/2021   Lab Results  Component Value Date   LDLCALC 103 (H) 09/20/2021   Lab Results  Component Value Date   TRIG 159 (H) 09/20/2021   Lab Results  Component Value Date   CHOLHDL 2.9 09/20/2021   No results found for: HGBA1C     Assessment & Plan:   Problem List Items Addressed This Visit   None Visit Diagnoses     Sinus pain    -  Primary      Sinus pain and pressure.  I do not feel like she has any active infection that I am seeing I think a lot of it still just sinus pressure that is left over.  We will start a nasal steroid spray which I think  she has used before we will remove the Atrovent from her list and add to her intolerance list.  We had previously prescribed that for rhinorrhea.  If she is not improving over the next week then please let me know.  She only had minimal improvement with the antibiotic and the steroid.  Recommend using Tylenol for the next couple days to reduce headache and at that point to stop and see how she does.   Meds ordered this encounter  Medications   fluticasone (FLONASE) 50 MCG/ACT nasal spray    Sig: Place 1-2 sprays into both nostrils daily. For nasal congestion    Dispense:  16 g    Refill:  5     Beatrice Lecher, MD

## 2021-12-12 DIAGNOSIS — Z1231 Encounter for screening mammogram for malignant neoplasm of breast: Secondary | ICD-10-CM | POA: Diagnosis not present

## 2021-12-29 ENCOUNTER — Ambulatory Visit: Payer: Medicare HMO | Admitting: Family Medicine

## 2022-01-14 ENCOUNTER — Other Ambulatory Visit: Payer: Self-pay | Admitting: Family Medicine

## 2022-01-14 DIAGNOSIS — I10 Essential (primary) hypertension: Secondary | ICD-10-CM

## 2022-01-18 ENCOUNTER — Other Ambulatory Visit: Payer: Self-pay | Admitting: Family Medicine

## 2022-01-18 DIAGNOSIS — I10 Essential (primary) hypertension: Secondary | ICD-10-CM

## 2022-01-18 DIAGNOSIS — E876 Hypokalemia: Secondary | ICD-10-CM

## 2022-02-20 ENCOUNTER — Encounter: Payer: Self-pay | Admitting: Medical-Surgical

## 2022-02-20 ENCOUNTER — Ambulatory Visit (INDEPENDENT_AMBULATORY_CARE_PROVIDER_SITE_OTHER): Payer: Medicare HMO | Admitting: Medical-Surgical

## 2022-02-20 ENCOUNTER — Other Ambulatory Visit: Payer: Self-pay | Admitting: Family Medicine

## 2022-02-20 VITALS — BP 113/70 | HR 71 | Resp 20 | Ht 64.0 in | Wt 162.7 lb

## 2022-02-20 DIAGNOSIS — I10 Essential (primary) hypertension: Secondary | ICD-10-CM

## 2022-02-20 DIAGNOSIS — H6121 Impacted cerumen, right ear: Secondary | ICD-10-CM | POA: Diagnosis not present

## 2022-02-20 DIAGNOSIS — H9201 Otalgia, right ear: Secondary | ICD-10-CM

## 2022-02-20 DIAGNOSIS — E876 Hypokalemia: Secondary | ICD-10-CM

## 2022-02-20 MED ORDER — AMOXICILLIN-POT CLAVULANATE 875-125 MG PO TABS: 1.0000 | ORAL_TABLET | Freq: Two times a day (BID) | ORAL | 0 refills | Status: DC

## 2022-02-20 MED ORDER — NEOMYCIN-POLYMYXIN-HC 1 % OT SOLN
3.0000 [drp] | Freq: Four times a day (QID) | OTIC | 0 refills | Status: AC
Start: 2022-02-20 — End: 2022-02-27

## 2022-02-20 NOTE — Progress Notes (Signed)
?  HPI with pertinent ROS:  ? ?CC: ear pain ? ?HPI: ?Pleasant 88 year old female presenting today with complaints of right ear pain that started last night.  Pain was severe enough that she was unable to sleep well.  Notes that she did take a Tylenol at bedtime and is unsure if it was helpful.  Today her right ear is still hurting and now she has had some pain starting on the left side.  She does have some muffled hearing on the right with normal hearing on the left.  She has chronic tinnitus as well as chronic sinus congestion.  She feels the chronic sinus congestion is a bit worse than usual.  She has been taking Allegra-D on an as-needed basis but notes this has not been helpful.  She did restart Flonase last night.  Denies fever, chills, and other associated respiratory symptoms. ? ?I reviewed the past medical history, family history, social history, surgical history, and allergies today and no changes were needed.  Please see the problem list section below in epic for further details. ? ?Physical exam:  ? ?General: Well Developed, well nourished, and in no acute distress.  ?Neuro: Alert and oriented x3.  ?HEENT: Normocephalic, atraumatic.  Left external ear canal mildly erythematous with small amount of flaky white discharge noted.  TM with serous effusion on the left side.  Right external ear canal blocked with large amount of flaky white discharge.  After irrigation, small amount of flaky white discharge remains with erythema to the ear canal.  Right TM difficult to assess but appears intact, mildly erythematous.  No frontal or maxillary sinus tenderness.  Posterior oropharynx pink, clear, no exudate. ?Skin: Warm and dry. ?Cardiac: Regular rate and rhythm, no murmurs rubs or gallops, no lower extremity edema.  ?Respiratory: Clear to auscultation bilaterally. Not using accessory muscles, speaking in full sentences. ? ?Impression and Recommendations:   ? ?1. Right ear pain ?2. Impacted cerumen of right  ear ?Ceruminosis is noted.  Wax is removed by syringing and manual debridement.  Patient tolerated moderately well until near the end when she had worsening pain and discomfort.  Further attempts at irrigation aborted instructions for home care to prevent wax buildup are given. With erythema and flaky white discharge, will treat empirically with Cortisporin eardrops for otitis externa.  Erythema to the right TM and significant ear pain with poor visualization.  Erring on the side of caution and treating empirically for otitis media with Augmentin twice daily x7 days.  Patient verbalized understanding of the plan and will let us know if her symptoms do not improve or resolve.  Recommend continuing a daily antihistamine as well as daily use of Flonase.  Information provided on eustachian tube dysfunction as I feel this may be causing some of her left ear discomfort. ? ?Return if symptoms worsen or fail to improve. ?___________________________________________ ?Clearnce Sorrel, DNP, APRN, FNP-BC ?Primary Care and Sports Medicine ?[place] ?

## 2022-02-20 NOTE — Patient Instructions (Signed)
Eustachian Tube Dysfunction  Eustachian tube dysfunction refers to a condition in which a blockage develops in the narrow passage that connects the middle ear to the back of the nose (eustachian tube). The eustachian tube regulates air pressure in the middle ear by letting air move between the ear and nose. It also helps to drain fluid from the middle ear space. Eustachian tube dysfunction can affect one or both ears. When the eustachian tube does not function properly, air pressure, fluid, or both can build up in the middle ear. What are the causes? This condition occurs when the eustachian tube becomes blocked or cannot open normally. Common causes of this condition include: Ear infections. Colds and other infections that affect the nose, mouth, and throat (upper respiratory tract). Allergies. Irritation from cigarette smoke. Irritation from stomach acid coming up into the esophagus (gastroesophageal reflux). The esophagus is the part of the body that moves food from the mouth to the stomach. Sudden changes in air pressure, such as from descending in an airplane or scuba diving. Abnormal growths in the nose or throat, such as: Growths that line the nose (nasal polyps). Abnormal growth of cells (tumors). Enlarged tissue at the back of the throat (adenoids). What increases the risk? You are more likely to develop this condition if: You smoke. You are overweight. You are a child who has: Certain birth defects of the mouth, such as cleft palate. Large tonsils or adenoids. What are the signs or symptoms? Common symptoms of this condition include: A feeling of fullness in the ear. Ear pain. Clicking or popping noises in the ear. Ringing in the ear (tinnitus). Hearing loss. Loss of balance. Dizziness. Symptoms may get worse when the air pressure around you changes, such as when you travel to an area of high elevation, fly on an airplane, or go scuba diving. How is this diagnosed? This  condition may be diagnosed based on: Your symptoms. A physical exam of your ears, nose, and throat. Tests, such as those that measure: The movement of your eardrum. Your hearing (audiometry). How is this treated? Treatment depends on the cause and severity of your condition. In mild cases, you may relieve your symptoms by moving air into your ears. This is called "popping the ears." In more severe cases, or if you have symptoms of fluid in your ears, treatment may include: Medicines to relieve congestion (decongestants). Medicines that treat allergies (antihistamines). Nasal sprays or ear drops that contain medicines that reduce swelling (steroids). A procedure to drain the fluid in your eardrum. In this procedure, a small tube may be placed in the eardrum to: Drain the fluid. Restore the air in the middle ear space. A procedure to insert a balloon device through the nose to inflate the opening of the eustachian tube (balloon dilation). Follow these instructions at home: Lifestyle Do not do any of the following until your health care provider approves: Travel to high altitudes. Fly in airplanes. Work in a pressurized cabin or room. Scuba dive. Do not use any products that contain nicotine or tobacco. These products include cigarettes, chewing tobacco, and vaping devices, such as e-cigarettes. If you need help quitting, ask your health care provider. Keep your ears dry. Wear fitted earplugs during showering and bathing. Dry your ears completely after. General instructions Take over-the-counter and prescription medicines only as told by your health care provider. Use techniques to help pop your ears as recommended by your health care provider. These may include: Chewing gum. Yawning. Frequent, forceful swallowing.   Closing your mouth, holding your nose closed, and gently blowing as if you are trying to blow air out of your nose. Keep all follow-up visits. This is important. Contact a  health care provider if: Your symptoms do not go away after treatment. Your symptoms come back after treatment. You are unable to pop your ears. You have: A fever. Pain in your ear. Pain in your head or neck. Fluid draining from your ear. Your hearing suddenly changes. You become very dizzy. You lose your balance. Get help right away if: You have a sudden, severe increase in any of your symptoms. Summary Eustachian tube dysfunction refers to a condition in which a blockage develops in the eustachian tube. It can be caused by ear infections, allergies, inhaled irritants, or abnormal growths in the nose or throat. Symptoms may include ear pain or fullness, hearing loss, or ringing in the ears. Mild cases are treated with techniques to unblock the ears, such as yawning or chewing gum. More severe cases are treated with medicines or procedures. This information is not intended to replace advice given to you by your health care provider. Make sure you discuss any questions you have with your health care provider. Document Revised: 12/20/2020 Document Reviewed: 12/20/2020 Elsevier Patient Education  2023 [employer] Inc[employer]  

## 2022-02-27 ENCOUNTER — Other Ambulatory Visit: Payer: Self-pay | Admitting: Family Medicine

## 2022-02-27 DIAGNOSIS — E785 Hyperlipidemia, unspecified: Secondary | ICD-10-CM

## 2022-03-10 ENCOUNTER — Ambulatory Visit (INDEPENDENT_AMBULATORY_CARE_PROVIDER_SITE_OTHER): Payer: Medicare HMO | Admitting: Family Medicine

## 2022-03-10 ENCOUNTER — Encounter: Payer: Self-pay | Admitting: Family Medicine

## 2022-03-10 VITALS — BP 122/63 | HR 70 | Ht 64.0 in | Wt 160.0 lb

## 2022-03-10 DIAGNOSIS — I10 Essential (primary) hypertension: Secondary | ICD-10-CM | POA: Diagnosis not present

## 2022-03-10 DIAGNOSIS — Z23 Encounter for immunization: Secondary | ICD-10-CM | POA: Diagnosis not present

## 2022-03-10 DIAGNOSIS — G43009 Migraine without aura, not intractable, without status migrainosus: Secondary | ICD-10-CM

## 2022-03-10 DIAGNOSIS — F5101 Primary insomnia: Secondary | ICD-10-CM

## 2022-03-10 DIAGNOSIS — R7309 Other abnormal glucose: Secondary | ICD-10-CM

## 2022-03-10 DIAGNOSIS — N182 Chronic kidney disease, stage 2 (mild): Secondary | ICD-10-CM | POA: Diagnosis not present

## 2022-03-10 NOTE — Assessment & Plan Note (Signed)
Overall she is doing well she has not had any significant flares with the pollen.  She says she has had a few days with headaches but overall is doing well.

## 2022-03-10 NOTE — Assessment & Plan Note (Signed)
Well controlled. Continue current regimen. Follow up in  6 mo  

## 2022-03-10 NOTE — Assessment & Plan Note (Signed)
Following renal function every 6 months. 

## 2022-03-10 NOTE — Assessment & Plan Note (Signed)
Uses her Ambien as needed but does use it frequently.  She says she wakes up well rested and denies any significant side effects.

## 2022-03-10 NOTE — Progress Notes (Signed)
   Established Patient Office Visit  Subjective   Patient ID: Belinda Owens, female    DOB: 1932-05-17  Age: 88 y.o. MRN: [id number]  Chief Complaint  Patient presents with   Hypertension    HPI  Hypertension- Pt denies chest pain, SOB, dizziness, or heart palpitations.  Taking meds as directed w/o problems.  Denies medication side effects.    F/U Migraines - she is doing well overall. She was recently seen for an ear infection.  Has had an occasional headache but they have not been frequent.  F/U CKD 2  - following every 6 months.    Follow-up insomnia-tolerating Ambien well without any problems or side effects.    ROS    Objective:     BP 122/63   Pulse 70   Ht '5\' 4"'$  (1.626 m)   Wt 160 lb (72.6 kg)   SpO2 95%   BMI 27.46 kg/m    Physical Exam Vitals and nursing note reviewed.  Constitutional:      Appearance: She is well-developed.  HENT:     Head: Normocephalic and atraumatic.  Cardiovascular:     Rate and Rhythm: Normal rate and regular rhythm.     Heart sounds: Normal heart sounds.  Pulmonary:     Effort: Pulmonary effort is normal.     Breath sounds: Normal breath sounds.  Skin:    General: Skin is warm and dry.  Neurological:     Mental Status: She is alert and oriented to person, place, and time.  Psychiatric:        Behavior: Behavior normal.     No results found for any visits on 03/10/22.    The ASCVD Risk score (Arnett DK, et al., 2019) failed to calculate for the following reasons:   The 2019 ASCVD risk score is only valid for ages 42 to 24    Assessment & Plan:   Problem List Items Addressed This Visit       Cardiovascular and Mediastinum   Migraine    Overall she is doing well she has not had any significant flares with the pollen.  She says she has had a few days with headaches but overall is doing well.       Essential hypertension - Primary    Well controlled. Continue current regimen. Follow up in  6 mo         Relevant Orders   BASIC METABOLIC PANEL WITH GFR   Hemoglobin A1c     Genitourinary   CKD (chronic kidney disease) stage 2, GFR 60-89 ml/min    Following renal function every 6 months.       Relevant Orders   BASIC METABOLIC PANEL WITH GFR   Hemoglobin A1c     Other   Insomnia    Uses her Ambien as needed but does use it frequently.  She says she wakes up well rested and denies any significant side effects.       Other Visit Diagnoses     Abnormal glucose       Relevant Orders   BASIC METABOLIC PANEL WITH GFR   Hemoglobin A1c   Need for pneumococcal 20-valent conjugate vaccination       Relevant Orders   Pneumococcal conjugate vaccine 20-valent (Prevnar 20) (Completed)      Prevnar 20 given today.  Return in about 6 months (around 09/10/2022) for Hypertension.    Beatrice Lecher, MD

## 2022-03-11 LAB — BASIC METABOLIC PANEL WITH GFR
BUN/Creatinine Ratio: 20 (calc) (ref 6–22)
BUN: 20 mg/dL (ref 7–25)
CO2: 26 mmol/L (ref 20–32)
Calcium: 9.7 mg/dL (ref 8.6–10.4)
Chloride: 101 mmol/L (ref 98–110)
Creat: 0.99 mg/dL — ABNORMAL HIGH (ref 0.60–0.95)
Glucose, Bld: 91 mg/dL (ref 65–139)
Potassium: 3.7 mmol/L (ref 3.5–5.3)
Sodium: 140 mmol/L (ref 135–146)
eGFR: 55 mL/min/{1.73_m2} — ABNORMAL LOW (ref >=60)

## 2022-03-11 LAB — HEMOGLOBIN A1C
Hgb A1c MFr Bld: 5.7 % of total Hgb — ABNORMAL HIGH (ref <5.7)
Mean Plasma Glucose: 117 mg/dL
eAG (mmol/L): 6.5 mmol/L

## 2022-03-13 ENCOUNTER — Encounter: Payer: Self-pay | Admitting: Family Medicine

## 2022-03-13 DIAGNOSIS — R7301 Impaired fasting glucose: Secondary | ICD-10-CM | POA: Insufficient documentation

## 2022-03-13 NOTE — Progress Notes (Signed)
Call patient: Kidney function is stable.  Electrolytes are normal.  A1c 5.7 in the prediabetes range.  Encourage you to work on low-carb and low sugar diet, and regular exercise to reduce glucose levels.

## 2022-03-24 ENCOUNTER — Other Ambulatory Visit: Payer: Self-pay | Admitting: Family Medicine

## 2022-03-24 DIAGNOSIS — E876 Hypokalemia: Secondary | ICD-10-CM

## 2022-03-24 DIAGNOSIS — I10 Essential (primary) hypertension: Secondary | ICD-10-CM

## 2022-03-28 DIAGNOSIS — H6121 Impacted cerumen, right ear: Secondary | ICD-10-CM | POA: Diagnosis not present

## 2022-03-28 DIAGNOSIS — D110 Benign neoplasm of parotid gland: Secondary | ICD-10-CM | POA: Diagnosis not present

## 2022-04-03 ENCOUNTER — Ambulatory Visit (INDEPENDENT_AMBULATORY_CARE_PROVIDER_SITE_OTHER): Payer: Medicare HMO | Admitting: Family Medicine

## 2022-04-03 DIAGNOSIS — Z0000 Encounter for general adult medical examination without abnormal findings: Secondary | ICD-10-CM | POA: Diagnosis not present

## 2022-04-03 DIAGNOSIS — Z780 Asymptomatic menopausal state: Secondary | ICD-10-CM | POA: Diagnosis not present

## 2022-04-03 NOTE — Progress Notes (Signed)
MEDICARE ANNUAL WELLNESS VISIT  04/03/2022  Telephone Visit Disclaimer This Medicare AWV was conducted by telephone due to national recommendations for restrictions regarding the COVID-19 Pandemic (e.g. social distancing).  I verified, using two identifiers, that I am speaking with Belinda Owens or their authorized healthcare agent. I discussed the limitations, risks, security, and privacy concerns of performing an evaluation and management service by telephone and the potential availability of an in-person appointment in the future. The patient expressed understanding and agreed to proceed.  Location of Patient: Home Location of Provider (nurse):  In the office.  Subjective:    Belinda Owens is a 88 y.o. female patient of Metheney, Rene Kocher, MD who had a Medicare Annual Wellness Visit today via telephone. Belinda Owens is Retired and lives alone. she had 3 children, one has deceased. she reports that she is socially active and does interact with friends/family regularly. she is moderately physically active and enjoys playing scrabble and crossword puzzles.  Patient Care Team: Hali Marry, MD as PCP - General (Family Medicine) Darius Bump, [facility] as Pharmacist (Pharmacist)     04/03/2022    1:12 PM 01/19/2021   10:01 AM 07/29/2019    4:03 PM 03/13/2017   11:51 AM 01/04/2017   10:04 AM 08/19/2015    1:03 PM 03/25/2015    8:09 AM  Advanced Directives  Does Patient Have a Medical Advance Directive? No No No No No No No  Would patient like information on creating a medical advance directive? No - Patient declined No - Patient declined No - Patient declined No - Patient declined Yes (ED - Information included in AVS) No - patient declined information No - patient declined information    Hospital Utilization Over the Past 12 Months: # of hospitalizations or ER visits: 1 # of surgeries: 0  Review of Systems    Patient reports that her overall health is unchanged compared to last  year.  History obtained from chart review and the patient  Patient Reported Readings (BP, Pulse, CBG, Weight, etc) none  Pain Assessment Pain : No/denies pain     Current Medications & Allergies (verified) Allergies as of 04/03/2022       Reactions   Atenolol Other (See Comments), Rash   Headache Headache   Atorvastatin Other (See Comments)   Myalgia   Fluoxetine Other (See Comments), Rash   Tearfulness   Omeprazole Itching   Tylenol With Codeine #3  [acetaminophen-codeine] Nausea And Vomiting   Atrovent Nasal Spray [ipratropium] Other (See Comments)   burning        Medication List        Accurate as of April 03, 2022  1:23 PM. If you have any questions, ask your nurse or doctor.          amLODipine 5 MG tablet Commonly known as: NORVASC TAKE ONE TABLET BY MOUTH DAILY   CALCIUM 600+D PO Take 1 tablet by mouth daily.   CENTRUM SILVER 50+WOMEN PO Take 1 tablet by mouth daily.   fluticasone 50 MCG/ACT nasal spray Commonly known as: FLONASE Place 1-2 sprays into both nostrils daily. For nasal congestion   losartan-hydrochlorothiazide 100-25 MG tablet Commonly known as: HYZAAR TAKE ONE TABLET BY MOUTH DAILY   metoprolol succinate 25 MG 24 hr tablet Commonly known as: TOPROL-XL Take 1 tablet (25 mg total) by mouth daily.   pravastatin 20 MG tablet Commonly known as: PRAVACHOL TAKE ONE TABLET BY MOUTH EVERY NIGHT AT BEDTIME   sertraline 25 MG  tablet Commonly known as: ZOLOFT Take 1 tablet (25 mg total) by mouth daily.   vitamin B-12 500 MCG tablet Commonly known as: CYANOCOBALAMIN Take 500 mcg by mouth daily.   Vitamin D3 1.25 MG (50000 UT) Caps Take 1 capsule by mouth daily.   ZINC PO Take 50 mg by mouth daily.   zolpidem 5 MG tablet Commonly known as: AMBIEN TAKE ONE TABLET BY MOUTH EVERY NIGHT AT BEDTIME AS NEEDED FOR SLEEP        History (reviewed): Past Medical History:  Diagnosis Date   Fibrocystic breast changes of both  breasts    Gets yearly mammo and [place]   History of rheumatic fever    Past Surgical History:  Procedure Laterality Date   BREAST BIOPSY     MENISCUS REPAIR Right    RECONSTRUCTION OF EYELID Bilateral 12/14/2016   ROTATOR CUFF REPAIR Right    Family History  Problem Relation Age of Onset   Breast cancer Maternal Aunt    Colon cancer Father    Asthma Daughter    Heart disease Mother    Uterine cancer Mother    Cancer - Other Brother    Social History   Socioeconomic History   Marital status: Divorced    Spouse name: Not on file   Number of children: 3   Years of education: 13   Highest education level: Some college, no degree  Occupational History   Occupation: retired.     Comment: [employer]  Tobacco Use   Smoking status: Former    Types: Cigarettes    Quit date: 10/23/1957    Years since quitting: 64.4   Smokeless tobacco: Never  Vaping Use   Vaping Use: Never used  Substance and Sexual Activity   Alcohol use: Not Currently    Alcohol/week: 0.0 standard drinks of alcohol   Drug use: No   Sexual activity: Never    Partners: Male    Birth control/protection: None  Other Topics Concern   Not on file  Social History Narrative   Quit smoking 1959. Lives alone. Had three children, one has deceased.   Social Determinants of Health   Financial Resource Strain: Low Risk  (04/02/2022)   Overall Financial Resource Strain (CARDIA)    Difficulty of Paying Living Expenses: Not hard at all  Food Insecurity: No Food Insecurity (04/02/2022)   Hunger Vital Sign    Worried About Running Out of Food in the Last Year: Never true    Ran Out of Food in the Last Year: Never true  Transportation Needs: No Transportation Needs (04/02/2022)   PRAPARE - Hydrologist (Medical): No    Lack of Transportation (Non-Medical): No  Physical Activity: Insufficiently Active (04/02/2022)   Exercise Vital Sign    Days of Exercise per Week: 1 day    Minutes of  Exercise per Session: 10 min  Stress: No Stress Concern Present (04/02/2022)   [place]    Feeling of Stress : Not at all  Social Connections: Moderately Integrated (04/02/2022)   Social Connection and Isolation Panel [NHANES]    Frequency of Communication with Friends and Family: Three times a week    Frequency of Social Gatherings with Friends and Family: Three times a week    Attends Religious Services: More than 4 times per year    Active Member of Clubs or Organizations: Yes    Attends Archivist Meetings: More than  4 times per year    Marital Status: Divorced    Activities of Daily Living    04/02/2022    1:46 PM  In your present state of health, do you have any difficulty performing the following activities:  Hearing? 0  Vision? 0  Difficulty concentrating or making decisions? 0  Walking or climbing stairs? 0  Dressing or bathing? 0  Doing errands, shopping? 0  Preparing Food and eating ? N  Using the Toilet? N  In the past six months, have you accidently leaked urine? N  Do you have problems with loss of bowel control? N  Managing your Medications? N  Managing your Finances? N  Housekeeping or managing your Housekeeping? N    Patient Education/ Literacy How often do you need to have someone help you when you read instructions, pamphlets, or other written materials from your doctor or pharmacy?: 1 - Never What is the last grade level you completed in school?: one year of college  Exercise Current Exercise Habits: Home exercise routine, Type of exercise: walking, Time (Minutes): 10, Frequency (Times/Week): 1, Weekly Exercise (Minutes/Week): 10, Intensity: Moderate, Exercise limited by: None identified  Diet Patient reports consuming 2 meals a day and 2 snack(s) a day Patient reports that her primary diet is: Regular Patient reports that she does have regular access to food.   Depression  Screen    04/03/2022    1:13 PM 02/20/2022    3:24 PM 12/09/2021    2:35 PM 01/19/2021   10:01 AM 01/10/2021   11:21 AM 07/13/2020   10:50 AM 01/07/2020   10:28 AM  PHQ 2/9 Scores  PHQ - 2 Score 0 0 0 0 0 0 0  PHQ- 9 Score      0 1     Fall Risk    04/03/2022    1:13 PM 04/02/2022    1:46 PM 02/20/2022    3:24 PM 12/09/2021    2:35 PM 05/17/2021    2:21 PM  [place] in the past year? 1 1 0 0 0  Comment     Last fall in 04/2021 - seen in ER for a closed head injury  Number falls in past yr: 0 0 0 0 0  Injury with Fall? 0 0 0 0 0  Risk for fall due to : No Fall Risks  No Fall Risks No Fall Risks History of fall(s)  Follow up Falls evaluation completed  Falls evaluation completed Falls prevention discussed;Falls evaluation completed Falls evaluation completed     Objective:  Belinda Owens seemed alert and oriented and she participated appropriately during our telephone visit.  Blood Pressure Weight BMI  BP Readings from Last 3 Encounters:  03/10/22 122/63  02/20/22 113/70  12/09/21 137/69   Wt Readings from Last 3 Encounters:  03/10/22 160 lb (72.6 kg)  02/20/22 162 lb 11.2 oz (73.8 kg)  12/09/21 167 lb (75.8 kg)   BMI Readings from Last 1 Encounters:  03/10/22 27.46 kg/m    *Unable to obtain current vital signs, weight, and BMI due to telephone visit type  Hearing/Vision  Belinda Owens did not seem to have difficulty with hearing/understanding during the telephone conversation Reports that she has not had a formal eye exam by an eye care professional within the past year Reports that she has not had a formal hearing evaluation within the past year *Unable to fully assess hearing and vision during telephone visit type  Cognitive Function:    04/03/2022  1:17 PM 01/19/2021   10:05 AM 07/29/2019    4:06 PM  6CIT Screen  What Year? 0 points 0 points 0 points  What month? 0 points 0 points 0 points  What time? 0 points 0 points 0 points  Count back from 20 0 points 0  points 0 points  Months in reverse 0 points 0 points 0 points  Repeat phrase 0 points 0 points 0 points  Total Score 0 points 0 points 0 points   (Normal:0-7, Significant for Dysfunction: >8)  Normal Cognitive Function Screening: Yes   Immunization & Health Maintenance Record Immunization History  Administered Date(s) Administered   Fluad Quad(high Dose 65+) 07/15/2019, 09/19/2021   Influenza, High Dose Seasonal PF 07/16/2017, 07/17/2018   Influenza,inj,Quad PF,6+ Mos 08/05/2015, 07/06/2016   Influenza-Unspecified 07/15/2020   Moderna Sars-Covid-2 Vaccination 01/01/2020, 02/03/2020   PFIZER(Purple Top)SARS-COV-2 Vaccination 07/15/2020   PNEUMOCOCCAL CONJUGATE-20 03/10/2022   Pneumococcal Conjugate-13 08/19/2015   Pneumococcal Polysaccharide-23 01/04/2017   Tdap 03/23/2014    Health Maintenance  Topic Date Due   COVID-19 Vaccine (4 - Booster) 04/19/2022 (Originally 09/09/2020)   Zoster Vaccines- Shingrix (1 of 2) 07/04/2022 (Originally 10/13/1951)   INFLUENZA VACCINE  05/23/2022   TETANUS/TDAP  03/23/2024   Pneumonia Vaccine 37+ Years old  Completed   DEXA SCAN  Completed   HPV VACCINES  Aged Out       Assessment  This is a routine wellness examination for [employer] [facility] [employer].  Health Maintenance: Due or Overdue There are no preventive care reminders to display for this patient.   Belinda Owens does not need a referral for [employer] Assistance: Care Management:   no Social Work:    no Prescription Assistance:  no Nutrition/Diabetes Education:  no   Plan:  Personalized Goals  Goals Addressed               This Visit's Progress     Patient Stated (pt-stated)        Would like to continue to be healthy.       Personalized Health Maintenance & Screening Recommendations  Shingrix vaccine  Lung Cancer Screening Recommended: no (Low Dose CT Chest recommended if Age 19-80 years, 30 pack-year currently smoking OR have quit w/in past 15 years) Hepatitis C Screening  recommended: no HIV Screening recommended: no  Advanced Directives: Written information was not prepared per patient's request.  Referrals & Orders Orders Placed This Encounter  Procedures   [place]    Follow-up Plan Follow-up with Hali Marry, MD as planned Schedule your Shingrix vaccine at your pharmacy.  Bone density scan referral has been sent and they will call you to schedule. Medicare wellness visit in one year.    I have personally reviewed and noted the following in the patient's chart:   Medical and social history Use of alcohol, tobacco or illicit drugs  Current medications and supplements Functional ability and status Nutritional status Physical activity Advanced directives List of other physicians Hospitalizations, surgeries, and ER visits in previous 12 months Vitals Screenings to include cognitive, depression, and falls Referrals and appointments  In addition, I have reviewed and discussed with Belinda Owens certain preventive protocols, quality metrics, and best practice recommendations. A written personalized care plan for preventive services as well as general preventive health recommendations is available and can be mailed to the patient at her request.      Tinnie Gens, RN-BSN  04/03/2022

## 2022-04-03 NOTE — Patient Instructions (Signed)
[place] Maintenance Summary and Written Plan of Care  [name] ,  Thank you for allowing me to perform your Medicare Annual Wellness Visit and for your ongoing commitment to your health.   Health Maintenance & Immunization History Health Maintenance  Topic Date Due   COVID-19 Vaccine (4 - Booster) 04/19/2022 (Originally 09/09/2020)   Zoster Vaccines- Shingrix (1 of 2) 07/04/2022 (Originally 10/13/1951)   INFLUENZA VACCINE  05/23/2022   TETANUS/TDAP  03/23/2024   Pneumonia Vaccine 41+ Years old  Completed   DEXA SCAN  Completed   HPV VACCINES  Aged Out   Immunization History  Administered Date(s) Administered   Fluad Quad(high Dose 65+) 07/15/2019, 09/19/2021   Influenza, High Dose Seasonal PF 07/16/2017, 07/17/2018   Influenza,inj,Quad PF,6+ Mos 08/05/2015, 07/06/2016   Influenza-Unspecified 07/15/2020   Moderna Sars-Covid-2 Vaccination 01/01/2020, 02/03/2020   PFIZER(Purple Top)SARS-COV-2 Vaccination 07/15/2020   PNEUMOCOCCAL CONJUGATE-20 03/10/2022   Pneumococcal Conjugate-13 08/19/2015   Pneumococcal Polysaccharide-23 01/04/2017   Tdap 03/23/2014    These are the patient goals that we discussed:  Goals Addressed               This Visit's Progress     Patient Stated (pt-stated)        Would like to continue to be healthy.         This is a list of Health Maintenance Items that are overdue or due now: Shingrix vaccine  Orders/Referrals Placed Today: Orders Placed This Encounter  Procedures   DEXAScan    Standing Status:   Future    Standing Expiration Date:   04/04/2023    Scheduling Instructions:     Please call patient to schedule. She is due after 08/12/22.    Order Specific Question:   Reason for exam:    Answer:   post menopausal    Order Specific Question:   Preferred imaging location?    Answer:   [facility]   (Contact our referral department at [phone] if you have not spoken with someone about  your referral appointment within the next 5 days)    Follow-up Plan Follow-up with Hali Marry, MD as planned Schedule your Shingrix vaccine at your pharmacy.  Bone density scan referral has been sent and they will call you to schedule. Medicare wellness visit in one year.      Health Maintenance, Female Adopting a healthy lifestyle and getting preventive care are important in promoting health and wellness. Ask your health care provider about: The right schedule for you to have regular tests and exams. Things you can do on your own to prevent diseases and keep yourself healthy. What should I know about diet, weight, and exercise? Eat a healthy diet  Eat a diet that includes plenty of vegetables, fruits, low-fat dairy products, and lean protein. Do not eat a lot of foods that are high in solid fats, added sugars, or sodium. Maintain a healthy weight Body mass index (BMI) is used to identify weight problems. It estimates body fat based on height and weight. Your health care provider can help determine your BMI and help you achieve or maintain a healthy weight. Get regular exercise Get regular exercise. This is one of the most important things you can do for your health. Most adults should: Exercise for at least 150 minutes each week. The exercise should increase your heart rate and make you sweat (moderate-intensity exercise). Do strengthening exercises at least twice a week. This is in addition to the  moderate-intensity exercise. Spend less time sitting. Even light physical activity can be beneficial. Watch cholesterol and blood lipids Have your blood tested for lipids and cholesterol at 86 years of age, then have this test every 5 years. Have your cholesterol levels checked more often if: Your lipid or cholesterol levels are high. You are older than 86 years of age. You are at high risk for heart disease. What should I know about cancer screening? Depending on your health  history and family history, you may need to have cancer screening at various ages. This may include screening for: Breast cancer. Cervical cancer. Colorectal cancer. Skin cancer. Lung cancer. What should I know about heart disease, diabetes, and high blood pressure? Blood pressure and heart disease High blood pressure causes heart disease and increases the risk of stroke. This is more likely to develop in people who have high blood pressure readings or are overweight. Have your blood pressure checked: Every 3-5 years if you are 5-33 years of age. Every year if you are 19 years old or older. Diabetes Have regular diabetes screenings. This checks your fasting blood sugar level. Have the screening done: Once every three years after age 91 if you are at a normal weight and have a low risk for diabetes. More often and at a younger age if you are overweight or have a high risk for diabetes. What should I know about preventing infection? Hepatitis B If you have a higher risk for hepatitis B, you should be screened for this virus. Talk with your health care provider to find out if you are at risk for hepatitis B infection. Hepatitis C Testing is recommended for: Everyone born from [birth year] through 1963. Anyone with known risk factors for hepatitis C. Sexually transmitted infections (STIs) Get screened for STIs, including gonorrhea and chlamydia, if: You are sexually active and are younger than 86 years of age. You are older than 86 years of age and your health care provider tells you that you are at risk for this type of infection. Your sexual activity has changed since you were last screened, and you are at increased risk for chlamydia or gonorrhea. Ask your health care provider if you are at risk. Ask your health care provider about whether you are at high risk for HIV. Your health care provider may recommend a prescription medicine to help prevent HIV infection. If you choose to take medicine to  prevent HIV, you should first get tested for HIV. You should then be tested every 3 months for as long as you are taking the medicine. Pregnancy If you are about to stop having your period (premenopausal) and you may become pregnant, seek counseling before you get pregnant. Take 400 to 800 micrograms (mcg) of folic acid every day if you become pregnant. Ask for birth control (contraception) if you want to prevent pregnancy. Osteoporosis and menopause Osteoporosis is a disease in which the bones lose minerals and strength with aging. This can result in bone fractures. If you are 55 years old or older, or if you are at risk for osteoporosis and fractures, ask your health care provider if you should: Be screened for bone loss. Take a calcium or vitamin D supplement to lower your risk of fractures. Be given hormone replacement therapy (HRT) to treat symptoms of menopause. Follow these instructions at home: Alcohol use Do not drink alcohol if: Your health care provider tells you not to drink. You are pregnant, may be pregnant, or are planning to become pregnant.  If you drink alcohol: Limit how much you have to: 0-1 drink a day. Know how much alcohol is in your drink. In the [place] one drink equals one 12 oz bottle of beer (355 mL), one 5 oz glass of wine (148 mL), or one 1 oz glass of hard liquor (44 mL). Lifestyle Do not use any products that contain nicotine or tobacco. These products include cigarettes, chewing tobacco, and vaping devices, such as e-cigarettes. If you need help quitting, ask your health care provider. Do not use street drugs. Do not share needles. Ask your health care provider for help if you need support or information about quitting drugs. General instructions Schedule regular health, dental, and eye exams. Stay current with your vaccines. Tell your health care provider if: You often feel depressed. You have ever been abused or do not feel safe at  home. Summary Adopting a healthy lifestyle and getting preventive care are important in promoting health and wellness. Follow your health care provider's instructions about healthy diet, exercising, and getting tested or screened for diseases. Follow your health care provider's instructions on monitoring your cholesterol and blood pressure. This information is not intended to replace advice given to you by your health care provider. Make sure you discuss any questions you have with your health care provider. Document Revised: 02/28/2021 Document Reviewed: 02/28/2021 Elsevier Patient Education  [place].

## 2022-04-13 ENCOUNTER — Other Ambulatory Visit: Payer: Self-pay | Admitting: Family Medicine

## 2022-04-13 DIAGNOSIS — I10 Essential (primary) hypertension: Secondary | ICD-10-CM

## 2022-04-21 ENCOUNTER — Other Ambulatory Visit: Payer: Self-pay | Admitting: Neurology

## 2022-04-21 DIAGNOSIS — E876 Hypokalemia: Secondary | ICD-10-CM

## 2022-04-21 DIAGNOSIS — I10 Essential (primary) hypertension: Secondary | ICD-10-CM

## 2022-04-21 NOTE — Telephone Encounter (Signed)
Last written 03/25/2022 #30 no refills  Last appt 03/10/2022 and 04/03/2022 Medicare Wellness

## 2022-04-24 MED ORDER — ZOLPIDEM TARTRATE 5 MG PO TABS: ORAL_TABLET | ORAL | 0 refills | Status: DC

## 2022-05-16 ENCOUNTER — Other Ambulatory Visit: Payer: Self-pay | Admitting: Family Medicine

## 2022-05-16 DIAGNOSIS — I10 Essential (primary) hypertension: Secondary | ICD-10-CM

## 2022-05-23 ENCOUNTER — Other Ambulatory Visit: Payer: Self-pay | Admitting: Family Medicine

## 2022-05-23 DIAGNOSIS — I10 Essential (primary) hypertension: Secondary | ICD-10-CM

## 2022-05-23 DIAGNOSIS — E876 Hypokalemia: Secondary | ICD-10-CM

## 2022-05-26 DIAGNOSIS — E661 Drug-induced obesity: Secondary | ICD-10-CM | POA: Diagnosis not present

## 2022-05-26 DIAGNOSIS — Z9189 Other specified personal risk factors, not elsewhere classified: Secondary | ICD-10-CM | POA: Diagnosis not present

## 2022-05-26 DIAGNOSIS — Z6831 Body mass index (BMI) 31.0-31.9, adult: Secondary | ICD-10-CM | POA: Diagnosis not present

## 2022-05-26 DIAGNOSIS — Z1231 Encounter for screening mammogram for malignant neoplasm of breast: Secondary | ICD-10-CM | POA: Diagnosis not present

## 2022-05-26 DIAGNOSIS — N6092 Unspecified benign mammary dysplasia of left breast: Secondary | ICD-10-CM | POA: Diagnosis not present

## 2022-06-01 ENCOUNTER — Other Ambulatory Visit: Payer: Self-pay | Admitting: Family Medicine

## 2022-06-01 DIAGNOSIS — E876 Hypokalemia: Secondary | ICD-10-CM

## 2022-06-01 DIAGNOSIS — I10 Essential (primary) hypertension: Secondary | ICD-10-CM

## 2022-06-28 ENCOUNTER — Other Ambulatory Visit: Payer: Self-pay

## 2022-06-28 DIAGNOSIS — I10 Essential (primary) hypertension: Secondary | ICD-10-CM

## 2022-06-28 DIAGNOSIS — E876 Hypokalemia: Secondary | ICD-10-CM

## 2022-06-28 MED ORDER — ZOLPIDEM TARTRATE 5 MG PO TABS: ORAL_TABLET | ORAL | 0 refills | Status: DC

## 2022-06-28 NOTE — Telephone Encounter (Signed)
H/T pharmacy requesting med refill for zolpidem. Rx pended.   Last OV: 03/10/22 Next OV: 09/11/22 Last RF: 05/23/22

## 2022-07-11 ENCOUNTER — Other Ambulatory Visit: Payer: Self-pay | Admitting: Family Medicine

## 2022-07-11 ENCOUNTER — Ambulatory Visit (INDEPENDENT_AMBULATORY_CARE_PROVIDER_SITE_OTHER): Payer: Medicare HMO | Admitting: Pharmacist

## 2022-07-11 DIAGNOSIS — I10 Essential (primary) hypertension: Secondary | ICD-10-CM

## 2022-07-11 DIAGNOSIS — R7301 Impaired fasting glucose: Secondary | ICD-10-CM

## 2022-07-11 NOTE — Progress Notes (Signed)
Chronic Care Management Pharmacy Note  07/11/2022 Name:  Belinda Owens MRN:  [id number] DOB:  1932-08-27  Summary: addressed HTN, HLD. Reviewed labwork, medication list, and discussed A1c showing pre-diabetes.  Recommendations/Changes made from today's visit:  - No medication changes - Counseled on non-pharmacologic management of elevated glucose, proactive/realistic goals for nutrition and physical activity  Plan: f/u with pharmacist in 6-8 months  Subjective: Belinda Owens is an 88 y.o. year old female who is a primary patient of Metheney, Rene Kocher, MD.  The CCM team was consulted for assistance with disease management and care coordination needs.    Engaged with patient by telephone for follow up visit in response to provider referral for pharmacy case management and/or care coordination services.   Consent to Services:  The patient was given information about Chronic Care Management services, agreed to services, and gave verbal consent prior to initiation of services.  Please see initial visit note for detailed documentation.   Patient Care Team: Hali Marry, MD as PCP - General (Family Medicine) Darius Bump, [facility] as Pharmacist (Pharmacist)  Recent office visits:  05/17/21-Catherine D. Madilyn Fireman, MD (PCP) Hospital follow up. Labs ordered. 05/10/21-Catherine D. Madilyn Fireman, MD (PCP) Seen for a fall. Start Augmentin 875-125 mg tab. 04/22/21-Thomas Thekkekandam. Mass of leg. X-rays ordered and MRI ordered. 03/16/21-Catherine D. Madilyn Fireman, MD (PCP) Seen for Sinus pressure. Start Doxycycline 100 mg tabs. 03/08/21-Catherine D. Madilyn Fireman, MD (PCP) Curley Spice or Sinusitis and Migraine. Start on Prednisone 10 mg.  03/03/21-Taylor B. Beck, NP(Video Visit) Seen for Upper respiratory infection. Return if symtoms worsen or fail to improve. 01/10/21-Catherine D. Madilyn Fireman, MD (PCP) Seen for Hypertension. Labs ordered. Follow up in 6 months.   Recent consult visits:  12/08/20-Geoffrey D.  Rieser ([place] center). Notes not available. 12/06/20-Dennis M. Floyce [facility]) Notes not available.   Hospital visits:  Medication Reconciliation was completed by comparing discharge summary, patient's EMR and Pharmacy list, and upon discussion with patient.   Admitted to the hospital on 05/11/21 due to Medical screening. Discharge date was 05/11/21. Discharged from [facility]?Medications Started at [facility] Discharge:?? -started None noted   Medication Changes at Hospital Discharge: -Changed None noted   Medications Discontinued at Hospital Discharge: -Stopped None noted   Medications that remain the same after Hospital Discharge:??  -All other medications will remain the same.       Medication Reconciliation was completed by comparing discharge summary, patient's EMR and Pharmacy list, and upon discussion with patient.   Admitted to the hospital on 05/09/21 due to Fall. Discharge date was 05/09/21. Discharged from [place]?Medications Started at [facility] Discharge:?? -started none noted   Medication Changes at Hospital Discharge: -Changed none noted   Medications Discontinued at Hospital Discharge: -Stopped none noted   Medications that remain the same after Hospital Discharge:??  -All other medications will remain the same.    Objective:  Lab Results  Component Value Date   CREATININE 0.99 (H) 03/10/2022   CREATININE 0.93 09/20/2021   CREATININE 0.83 04/22/2021        Component Value Date/Time   CHOL 198 09/20/2021 0000   TRIG 159 (H) 09/20/2021 0000   HDL 69 09/20/2021 0000   CHOLHDL 2.9 09/20/2021 0000   VLDL 31 (H) 07/06/2016 1432   LDLCALC 103 (H) 09/20/2021 0000       Latest Ref Rng & Units 09/20/2021   12:00 AM 01/10/2021  12:16 PM 01/07/2020   11:12 AM  Hepatic Function  Total Protein 6.1 - 8.1 g/dL 7.2  7.1  7.3   AST  10 - 35 U/L '20  16  27   '$ ALT 6 - 29 U/L '16  11  28   '$ Total Bilirubin 0.2 - 1.2 mg/dL 0.7  0.5  0.5     Lab Results  Component Value Date/Time   TSH 2.43 02/19/2018 03:28 PM   TSH 2.023 11/04/2015 10:46 AM       Latest Ref Rng & Units 09/20/2021   12:00 AM 01/10/2021   12:16 PM 02/19/2018    3:28 PM  CBC  WBC 3.8 - 10.8 Thousand/uL 7.5  6.7  6.6   Hemoglobin 11.7 - 15.5 g/dL 14.3  14.2  13.9   Hematocrit 35.0 - 45.0 % 43.2  42.3  40.3   Platelets 140 - 400 Thousand/uL 283  260  276     Social History   Tobacco Use  Smoking Status Former   Types: Cigarettes   Quit date: 10/23/1957   Years since quitting: 64.7  Smokeless Tobacco Never   BP Readings from Last 3 Encounters:  03/10/22 122/63  02/20/22 113/70  12/09/21 137/69   Pulse Readings from Last 3 Encounters:  03/10/22 70  02/20/22 71  12/09/21 67   Wt Readings from Last 3 Encounters:  03/10/22 160 lb (72.6 kg)  02/20/22 162 lb 11.2 oz (73.8 kg)  12/09/21 167 lb (75.8 kg)    Assessment: Review of patient past medical history, allergies, medications, health status, including review of consultants reports, laboratory and other test data, was performed as part of comprehensive evaluation and provision of chronic care management services.   SDOH:  (Social Determinants of Health) assessments and interventions performed:  SDOH Interventions    Flowsheet Row Clinical Support from 01/19/2021 in [place] Office Visit from 01/07/2020 in [place] Interventions Intervention Not Indicated --  Housing Interventions Intervention Not Indicated --  Transportation Interventions Intervention Not Indicated --  Depression Interventions/Treatment  -- Currently on Treatment  Financial Strain Interventions Intervention Not Indicated --  Physical Activity Interventions Intervention Not Indicated --  Stress Interventions  Intervention Not Indicated --  Social Connections Interventions Intervention Not Indicated --       CCM Care Plan  Allergies  Allergen Reactions   Atenolol Other (See Comments) and Rash    Headache Headache   Atorvastatin Other (See Comments)    Myalgia   Fluoxetine Other (See Comments) and Rash    Tearfulness   Omeprazole Itching   Tylenol With Codeine #3  [Acetaminophen-Codeine] Nausea And Vomiting   Atrovent Nasal Spray [Ipratropium] Other (See Comments)    burning    Medications Reviewed Today     Reviewed by Tinnie Gens, RN (Registered Nurse) on 04/03/22 at [place] List Status: <None>   Medication Order Taking? Sig Documenting Provider Last Dose Status Informant  amLODipine (NORVASC) 5 MG tablet [id number] Yes TAKE ONE TABLET BY MOUTH DAILY Hali Marry, MD Taking Active   Calcium Carbonate-Vitamin D (CALCIUM 600+D PO) [id number] Yes Take 1 tablet by mouth daily. [provider] Taking Active   Cholecalciferol (VITAMIN D3) 1.25 MG (50000 UT) CAPS [id number] Yes Take 1 capsule by mouth daily. [provider] Taking Active   fluticasone (FLONASE) 50 MCG/ACT nasal spray [id number] Yes Place 1-2 sprays into both nostrils daily.  For nasal congestion Hali Marry, MD Taking Active   losartan-hydrochlorothiazide [facility]) 100-25 MG tablet [id number] Yes TAKE ONE TABLET BY MOUTH DAILY Hali Marry, MD Taking Active   metoprolol succinate (TOPROL-XL) 25 MG 24 hr tablet [id number] Yes Take 1 tablet (25 mg total) by mouth daily. Hali Marry, MD Taking Active   Multiple Vitamins-Minerals (CENTRUM SILVER 50+WOMEN PO) [id number] Yes Take 1 tablet by mouth daily. [provider] Taking Active   Multiple Vitamins-Minerals (ZINC PO) [id number] Yes Take 50 mg by mouth daily. [provider] Taking Active   pravastatin (PRAVACHOL) 20 MG tablet [id number] Yes TAKE ONE TABLET BY MOUTH EVERY NIGHT AT BEDTIME Hali Marry,  MD Taking Active   sertraline (ZOLOFT) 25 MG tablet [id number] Yes Take 1 tablet (25 mg total) by mouth daily. Hali Marry, MD Taking Active   vitamin B-12 (CYANOCOBALAMIN) 500 MCG tablet [id number] Yes Take 500 mcg by mouth daily. [provider] Taking Active   zolpidem (AMBIEN) 5 MG tablet [id number] Yes TAKE ONE TABLET BY MOUTH EVERY NIGHT AT BEDTIME AS NEEDED FOR SLEEP Donella Stade, PA-C Taking Active             Patient Active Problem List   Diagnosis Date Noted   IFG (impaired fasting glucose) 03/13/2022   Mass of leg, left 04/22/2021   Migraine 03/08/2021   History of TIA (transient ischemic attack) 01/10/2021   Primary osteoarthritis of right wrist 10/02/2018   De Quervain's tenosynovitis, right 10/02/2018   CKD (chronic kidney disease) stage 2, GFR 60-89 ml/min 08/18/2018   Myalgia due to statin 02/19/2018   Adenoma of duodenum 04/10/2017   Dermatochalasis of both upper eyelids 12/14/2016   Atypical ductal hyperplasia of left breast 12/01/2016   Sedentary lifestyle 12/01/2016   Class 1 drug-induced obesity without serious comorbidity with body mass index (BMI) of 31.0 to 31.9 in adult 12/01/2016   At risk for breast cancer 12/01/2016   Primary osteoarthritis of left knee 06/08/2016   Osteopenia 09/29/2015   Trochanteric bursitis of right hip 05/05/2015   Ear ringing sound 04/27/2015   Bilateral shoulder pain 03/16/2015   Degenerative disc disease, cervical 03/16/2015   Insomnia 03/09/2015   Hyperlipidemia 02/03/2015   Essential hypertension 02/03/2015   DDD (degenerative disc disease), lumbar 02/03/2015   GERD (gastroesophageal reflux disease) 02/03/2015   Anxiety state 02/03/2015   Fibrocystic breast changes of both breasts 02/03/2015   Microscopic hematuria 02/03/2015   Allergic bronchitis 02/03/2015    Immunization History  Administered Date(s) Administered   Fluad Quad(high Dose 65+) 07/15/2019, 09/19/2021   Influenza, High Dose  Seasonal PF 07/16/2017, 07/17/2018   Influenza,inj,Quad PF,6+ Mos 08/05/2015, 07/06/2016   Influenza-Unspecified 07/15/2020   Moderna Sars-Covid-2 Vaccination 01/01/2020, 02/03/2020   PFIZER(Purple Top)SARS-COV-2 Vaccination 07/15/2020   PNEUMOCOCCAL CONJUGATE-20 03/10/2022   Pneumococcal Conjugate-13 08/19/2015   Pneumococcal Polysaccharide-23 01/04/2017   Tdap 03/23/2014    Conditions to be addressed/monitored: HTN and HLD  There are no care plans that you recently modified to display for this patient.    Medication Assistance: None required.  Patient affirms current coverage meets needs.  Patient's preferred pharmacy is:  [id number] - [place], [place] [address] Phone: [phone] Fax: [phone]   Uses pill box? Yes Pt endorses 100% compliance  Follow Up:  Patient agrees to Care Plan and Follow-up.  Plan: Telephone follow up appointment with care management team member scheduled for:  6-8 months  Larinda Buttery, PharmD Clinical Pharmacist [facility] [phone]

## 2022-07-13 NOTE — Addendum Note (Signed)
Addended by: Beatrice Lecher D on: 07/13/2022 07:38 AM   Modules accepted: Orders

## 2022-07-13 NOTE — Progress Notes (Signed)
Orders Placed This Encounter  Procedures   AMB Referral to Pharmacy Medication Management    Referral Priority:   Routine    Referral Type:   Consultation    Referral Reason:   Pharmacy Medication Management    Number of Visits Requested:   1   Agree with above.

## 2022-07-25 ENCOUNTER — Other Ambulatory Visit: Payer: Self-pay | Admitting: Family Medicine

## 2022-07-25 DIAGNOSIS — I10 Essential (primary) hypertension: Secondary | ICD-10-CM

## 2022-07-25 DIAGNOSIS — E876 Hypokalemia: Secondary | ICD-10-CM

## 2022-07-28 ENCOUNTER — Other Ambulatory Visit: Payer: Self-pay

## 2022-07-28 DIAGNOSIS — E785 Hyperlipidemia, unspecified: Secondary | ICD-10-CM

## 2022-07-28 MED ORDER — PRAVASTATIN SODIUM 20 MG PO TABS: 20.0000 mg | ORAL_TABLET | Freq: Every day | ORAL | 1 refills | Status: DC

## 2022-08-25 ENCOUNTER — Other Ambulatory Visit: Payer: Self-pay | Admitting: Family Medicine

## 2022-08-25 DIAGNOSIS — E876 Hypokalemia: Secondary | ICD-10-CM

## 2022-08-25 DIAGNOSIS — I10 Essential (primary) hypertension: Secondary | ICD-10-CM

## 2022-09-11 ENCOUNTER — Encounter: Payer: Self-pay | Admitting: Family Medicine

## 2022-09-11 ENCOUNTER — Ambulatory Visit (INDEPENDENT_AMBULATORY_CARE_PROVIDER_SITE_OTHER): Payer: Medicare HMO | Admitting: Family Medicine

## 2022-09-11 VITALS — BP 118/59 | HR 66 | Ht 64.0 in | Wt 163.0 lb

## 2022-09-11 DIAGNOSIS — I10 Essential (primary) hypertension: Secondary | ICD-10-CM | POA: Diagnosis not present

## 2022-09-11 DIAGNOSIS — E785 Hyperlipidemia, unspecified: Secondary | ICD-10-CM | POA: Diagnosis not present

## 2022-09-11 DIAGNOSIS — Z23 Encounter for immunization: Secondary | ICD-10-CM | POA: Diagnosis not present

## 2022-09-11 DIAGNOSIS — R7301 Impaired fasting glucose: Secondary | ICD-10-CM

## 2022-09-11 DIAGNOSIS — F5101 Primary insomnia: Secondary | ICD-10-CM

## 2022-09-11 LAB — POCT GLYCOSYLATED HEMOGLOBIN (HGB A1C): Hemoglobin A1C: 5.9 % — AB (ref 4.0–5.6)

## 2022-09-11 NOTE — Progress Notes (Signed)
Established Patient Office Visit  Subjective   Patient ID: Belinda Owens, female    DOB: [birth year]  Age: 88 y.o. MRN: [id number]  Chief Complaint  Patient presents with   Hypertension    HPI  Hypertension- Pt denies chest pain, SOB, dizziness, or heart palpitations.  Taking meds as directed w/o problems.  Denies medication side effects.    Impaired fasting glucose-no increased thirst or urination. No symptoms consistent with hypoglycemia.  Mood and insomnia-she is doing really well.  She says she does not notice any symptoms of using the sertraline.  She is also taking her Ambien at night to help with her sleep so she does feel like she needs it at rest well.    ROS    Objective:     BP (!) 118/59   Pulse 66   Ht '5\' 4"'$  (1.626 m)   Wt 163 lb (73.9 kg)   SpO2 97%   BMI 27.98 kg/m    Physical Exam Vitals and nursing note reviewed.  Constitutional:      Appearance: She is well-developed.  HENT:     Head: Normocephalic and atraumatic.  Cardiovascular:     Rate and Rhythm: Normal rate and regular rhythm.     Heart sounds: Normal heart sounds.  Pulmonary:     Effort: Pulmonary effort is normal.     Breath sounds: Normal breath sounds.  Skin:    General: Skin is warm and dry.  Neurological:     Mental Status: She is alert and oriented to person, place, and time.  Psychiatric:        Behavior: Behavior normal.      Results for orders placed or performed in visit on 09/11/22  POCT glycosylated hemoglobin (Hb A1C)  Result Value Ref Range   Hemoglobin A1C 5.9 (A) 4.0 - 5.6 %   HbA1c POC (<> result, manual entry)     HbA1c, POC (prediabetic range)     HbA1c, POC (controlled diabetic range)        The ASCVD Risk score (Arnett DK, et al., 2019) failed to calculate for the following reasons:   The 2019 ASCVD risk score is only valid for ages 71 to 23    Assessment & Plan:   Problem List Items Addressed This Visit       Cardiovascular and Mediastinum    Essential hypertension - Primary    Blood pressure looks fantastic today.  Continue with current regimen.  Follow-up in 6 months.  Due for updated labs she will have them done when she returns after Thanksgiving.      Relevant Orders   POCT glycosylated hemoglobin (Hb A1C) (Completed)   COMPLETE METABOLIC PANEL WITH GFR   CBC   Lipid Panel w/reflex Direct LDL     Endocrine   IFG (impaired fasting glucose)    A1c looks great today at 5.9.  It is up a little bit from previous to just encouraged her to be careful with food choices over the holidays and to stay active.      Relevant Orders   POCT glycosylated hemoglobin (Hb A1C) (Completed)   COMPLETE METABOLIC PANEL WITH GFR   CBC   Lipid Panel w/reflex Direct LDL     Other   Insomnia    Continues to use zolpidem as needed.  Does not need refills currently.      Hyperlipidemia    Due to recheck lipids its been a year.  Just to make sure that LDL is  at goal.  Continue with pravastatin nightly.      Relevant Orders   POCT glycosylated hemoglobin (Hb A1C) (Completed)   COMPLETE METABOLIC PANEL WITH GFR   CBC   Lipid Panel w/reflex Direct LDL   Other Visit Diagnoses     Need for immunization against influenza       Relevant Orders   Flu Vaccine QUAD High Dose(Fluad) (Completed)      Flu vaccine given today.  Return in about 6 months (around 03/12/2023).    Beatrice Lecher, MD

## 2022-09-11 NOTE — Assessment & Plan Note (Signed)
Blood pressure looks fantastic today.  Continue with current regimen.  Follow-up in 6 months.  Due for updated labs she will have them done when she returns after Thanksgiving.

## 2022-09-11 NOTE — Assessment & Plan Note (Signed)
A1c looks great today at 5.9.  It is up a little bit from previous to just encouraged her to be careful with food choices over the holidays and to stay active.

## 2022-09-11 NOTE — Assessment & Plan Note (Signed)
Continues to use zolpidem as needed.  Does not need refills currently.

## 2022-09-11 NOTE — Assessment & Plan Note (Signed)
Due to recheck lipids its been a year.  Just to make sure that LDL is at goal.  Continue with pravastatin nightly.

## 2022-09-20 ENCOUNTER — Other Ambulatory Visit: Payer: Self-pay | Admitting: Family Medicine

## 2022-09-20 DIAGNOSIS — I10 Essential (primary) hypertension: Secondary | ICD-10-CM

## 2022-09-20 DIAGNOSIS — E876 Hypokalemia: Secondary | ICD-10-CM

## 2022-10-13 ENCOUNTER — Other Ambulatory Visit: Payer: Self-pay | Admitting: Family Medicine

## 2022-10-13 DIAGNOSIS — I10 Essential (primary) hypertension: Secondary | ICD-10-CM

## 2022-10-20 ENCOUNTER — Ambulatory Visit (INDEPENDENT_AMBULATORY_CARE_PROVIDER_SITE_OTHER): Payer: Medicare HMO

## 2022-10-20 ENCOUNTER — Ambulatory Visit
Admission: EM | Admit: 2022-10-20 | Discharge: 2022-10-20 | Disposition: A | Discharge status: Home / Self Care | Payer: Medicare HMO | Attending: Family Medicine | Admitting: Family Medicine

## 2022-10-20 ENCOUNTER — Encounter: Payer: Self-pay | Admitting: Emergency Medicine

## 2022-10-20 DIAGNOSIS — M25562 Pain in left knee: Secondary | ICD-10-CM | POA: Diagnosis not present

## 2022-10-20 MED ORDER — CELECOXIB 100 MG PO CAPS: 100.0000 mg | ORAL_CAPSULE | Freq: Two times a day (BID) | ORAL | 0 refills | Status: AC

## 2022-10-20 NOTE — ED Triage Notes (Signed)
Patient states that when she got up this morning, she began having left knee pain.  No apparent injury.  No swelling.  Patient denies any OTC pain meds.

## 2022-10-20 NOTE — Discharge Instructions (Addendum)
Advised patient of left knee x-ray results.  Advised patient to take medication as directed with food to completion.  Encourage patient to increase daily water intake to 64 ounces per day while taking this medication.  Advised patient if symptoms worsen please follow-up with PCP or [place] orthopedic provider for further evaluation.  Contact information is below.

## 2022-10-20 NOTE — ED Provider Notes (Signed)
[facility] C[facility]    CSN: [id number] Arrival date & time: 10/20/22  1630      History   Chief Complaint Chief Complaint  Patient presents with   Knee Pain    HPI Belinda Owens is a 88 y.o. female.   HPI 88 year old female presents with left knee pain since this morning.  Patient denies injury or insult to left knee.  PMH significant for history of TIA, migraine, and left leg mass.  Past Medical History:  Diagnosis Date   Fibrocystic breast changes of both breasts    Gets yearly mammo and [place]   History of rheumatic fever     Patient Active Problem List   Diagnosis Date Noted   IFG (impaired fasting glucose) 03/13/2022   Mass of leg, left 04/22/2021   Migraine 03/08/2021   History of TIA (transient ischemic attack) 01/10/2021   Primary osteoarthritis of right wrist 10/02/2018   De Quervain's tenosynovitis, right 10/02/2018   CKD (chronic kidney disease) stage 2, GFR 60-89 ml/min 08/18/2018   Myalgia due to statin 02/19/2018   Adenoma of duodenum 04/10/2017   Dermatochalasis of both upper eyelids 12/14/2016   Atypical ductal hyperplasia of left breast 12/01/2016   Sedentary lifestyle 12/01/2016   Class 1 drug-induced obesity without serious comorbidity with body mass index (BMI) of 31.0 to 31.9 in adult 12/01/2016   At risk for breast cancer 12/01/2016   Primary osteoarthritis of left knee 06/08/2016   Osteopenia 09/29/2015   Ear ringing sound 04/27/2015   Bilateral shoulder pain 03/16/2015   Degenerative disc disease, cervical 03/16/2015   Insomnia 03/09/2015   Hyperlipidemia 02/03/2015   Essential hypertension 02/03/2015   DDD (degenerative disc disease), lumbar 02/03/2015   GERD (gastroesophageal reflux disease) 02/03/2015   Anxiety state 02/03/2015   Fibrocystic breast changes of both breasts 02/03/2015   Microscopic hematuria 02/03/2015   Allergic bronchitis 02/03/2015    Past Surgical History:  Procedure Laterality Date   BREAST BIOPSY      MENISCUS REPAIR Right    RECONSTRUCTION OF EYELID Bilateral 12/14/2016   ROTATOR CUFF REPAIR Right     OB History     Gravida  3   Para  3   Term  3   Preterm      AB      Living         SAB      IAB      Ectopic      Multiple      Live Births  3            Home Medications    Prior to Admission medications   Medication Sig Start Date End Date Taking? Authorizing Provider  amLODipine (NORVASC) 5 MG tablet TAKE 1 TABLET BY MOUTH DAILY 10/13/22  Yes Hali Marry, MD  Calcium Carbonate-Vitamin D (CALCIUM 600+D PO) Take 1 tablet by mouth daily.   Yes [provider]  celecoxib (CELEBREX) 100 MG capsule Take 1 capsule (100 mg total) by mouth 2 (two) times daily for 15 days. 10/20/22 11/04/22 Yes Eliezer Lofts, FNP  Cholecalciferol (VITAMIN D3) 1.25 MG (50000 UT) CAPS Take 1 capsule by mouth daily.   Yes [provider]  fluticasone (FLONASE) 50 MCG/ACT nasal spray Place 1-2 sprays into both nostrils daily. For nasal congestion 12/09/21  Yes Hali Marry, MD  losartan-hydrochlorothiazide [facility]) 100-25 MG tablet TAKE 1 TABLET BY MOUTH DAILY 10/13/22  Yes Hali Marry, MD  metoprolol succinate (TOPROL-XL) 25 MG 24  hr tablet TAKE ONE TABLET BY MOUTH DAILY 05/16/22  Yes Hali Marry, MD  Multiple Vitamins-Minerals (CENTRUM SILVER 50+WOMEN PO) Take 1 tablet by mouth daily.   Yes [provider]  Multiple Vitamins-Minerals (ZINC PO) Take 50 mg by mouth daily.   Yes [provider]  pravastatin (PRAVACHOL) 20 MG tablet Take 1 tablet (20 mg total) by mouth at bedtime. 07/28/22  Yes Hali Marry, MD  sertraline (ZOLOFT) 25 MG tablet TAKE 1 TABLET BY MOUTH DAILY 08/25/22  Yes Hali Marry, MD  vitamin B-12 (CYANOCOBALAMIN) 500 MCG tablet Take 500 mcg by mouth daily.   Yes [provider]  zolpidem (AMBIEN) 5 MG tablet TAKE 1/2 TO 1 TABLET BY MOUTH EVERY NIGHT AT BEDTIME AS NEEDED FOR  SLEEP 09/23/22  Yes Hali Marry, MD    Family History Family History  Problem Relation Age of Onset   Breast cancer Maternal Aunt    Colon cancer Father    Asthma Daughter    Heart disease Mother    Uterine cancer Mother    Cancer - Other Brother     Social History Social History   Tobacco Use   Smoking status: Former    Types: Cigarettes    Quit date: 10/23/1957    Years since quitting: 65.0   Smokeless tobacco: Never  Vaping Use   Vaping Use: Never used  Substance Use Topics   Alcohol use: Not Currently    Alcohol/week: 0.0 standard drinks of alcohol   Drug use: No     Allergies   Atenolol, Atorvastatin, Fluoxetine, Omeprazole, Tylenol with codeine #3  [acetaminophen-codeine], and Atrovent nasal spray [ipratropium]   Review of Systems Review of Systems  Musculoskeletal:        Left knee pain  All other systems reviewed and are negative.    Physical Exam Triage Vital Signs ED Triage Vitals  Enc Vitals Group     BP 10/20/22 1755 125/74     Pulse Rate 10/20/22 1755 77     Resp 10/20/22 1755 18     Temp 10/20/22 1755 99 F (37.2 C)     Temp Source 10/20/22 1755 Oral     SpO2 10/20/22 1755 96 %     Weight 10/20/22 1757 162 lb (73.5 kg)     Height 10/20/22 1757 '5\' 4"'$  (1.626 m)     Head Circumference --      Peak Flow --      Pain Score 10/20/22 1756 7     Pain Loc --      Pain Edu? --      Excl. in [place]? --    No data found.  Updated Vital Signs BP 125/74 (BP Location: Left Arm)   Pulse 77   Temp 99 F (37.2 C) (Oral)   Resp 18   Ht '5\' 4"'$  (1.626 m)   Wt 162 lb (73.5 kg)   SpO2 96%   BMI 27.81 kg/m   Visual Acuity Right Eye Distance:   Left Eye Distance:   Bilateral Distance:    Right Eye Near:   Left Eye Near:    Bilateral Near:     Physical Exam Vitals and nursing note reviewed.  Constitutional:      General: She is not in acute distress.    Appearance: Normal appearance. She is not ill-appearing.  HENT:     Head:  Normocephalic and atraumatic.     Mouth/Throat:     Mouth: Mucous membranes are moist.  Pharynx: Oropharynx is clear.  Eyes:     General:        Right eye: No discharge.     Extraocular Movements: Extraocular movements intact.     Conjunctiva/sclera: Conjunctivae normal.     Pupils: Pupils are equal, round, and reactive to light.  Cardiovascular:     Rate and Rhythm: Normal rate and regular rhythm.     Pulses: Normal pulses.     Heart sounds: Normal heart sounds. No murmur heard. Pulmonary:     Effort: Pulmonary effort is normal.     Breath sounds: Normal breath sounds. No wheezing, rhonchi or rales.  Musculoskeletal:        General: Normal range of motion.     Cervical back: Normal range of motion and neck supple.     Comments: Left knee (anterior aspect): TTP over superior aspect of patella, with mild soft tissue swelling noted  Skin:    General: Skin is warm and dry.  Neurological:     General: No focal deficit present.     Mental Status: She is alert and oriented to person, place, and time.      UC Treatments / Results  Labs (all labs ordered are listed, but only abnormal results are displayed) Labs Reviewed - No data to display  EKG   Radiology DG Knee Complete 4 Views Left  Result Date: 10/20/2022 CLINICAL DATA:  Left-sided knee pain EXAM: LEFT KNEE - COMPLETE 4+ VIEW COMPARISON:  04/22/2021 FINDINGS: No fracture or malalignment. Minimal lateral joint space spurring. No significant knee effusion. IMPRESSION: No acute osseous abnormality Electronically Signed   By: Donavan Foil M.D.   On: 10/20/2022 18:46    Procedures Procedures (including critical care time)  Medications Ordered in UC Medications - No data to display  Initial Impression / Assessment and Plan / UC Course  I have reviewed the triage vital signs and the nursing notes.  Pertinent labs & imaging results that were available during my care of the patient were reviewed by me and considered in  my medical decision making (see chart for details).     MDM: 1.  Left knee pain-left knee x-ray revealed above, Rx'd Celebrex. Advised patient of left knee x-ray results.  Advised patient to take medication as directed with food to completion.  Encourage patient to increase daily water intake to 64 ounces per day while taking this medication.  Advised patient if symptoms worsen please follow-up with PCP or [place] orthopedic provider for further evaluation.  Contact information is below.  Patient discharged home, hemodynamically stable. Final Clinical Impressions(s) / UC Diagnoses   Final diagnoses:  Acute pain of left knee     Discharge Instructions      Advised patient of left knee x-ray results.  Advised patient to take medication as directed with food to completion.  Encourage patient to increase daily water intake to 64 ounces per day while taking this medication.  Advised patient if symptoms worsen please follow-up with PCP or [place] orthopedic provider for further evaluation.  Contact information is below.     ED Prescriptions     Medication Sig Dispense Auth. Provider   celecoxib (CELEBREX) 100 MG capsule Take 1 capsule (100 mg total) by mouth 2 (two) times daily for 15 days. 30 capsule Eliezer Lofts, FNP      PDMP not reviewed this encounter.   Eliezer Lofts, [place] 10/20/22 1911

## 2022-11-13 ENCOUNTER — Other Ambulatory Visit: Payer: Self-pay | Admitting: Family Medicine

## 2022-11-13 DIAGNOSIS — I10 Essential (primary) hypertension: Secondary | ICD-10-CM

## 2022-11-24 ENCOUNTER — Other Ambulatory Visit: Payer: Self-pay | Admitting: Family Medicine

## 2022-11-24 DIAGNOSIS — I10 Essential (primary) hypertension: Secondary | ICD-10-CM

## 2022-11-24 DIAGNOSIS — E876 Hypokalemia: Secondary | ICD-10-CM

## 2022-11-24 NOTE — Telephone Encounter (Signed)
Last written 09/23/2022 #30 with 1 refill  Last appt 09/11/2022

## 2022-12-12 ENCOUNTER — Ambulatory Visit: Payer: Medicare HMO | Admitting: Family Medicine

## 2022-12-12 NOTE — Progress Notes (Deleted)
   Acute Office Visit  Subjective:     Patient ID: Belinda Owens, female    DOB: [birth year], 89 y.o.   MRN: [id number]  No chief complaint on file.   HPI Patient is in today for sinus symptoms.   ROS      Objective:    There were no vitals taken for this visit. {Vitals History (Optional):23777}  Physical Exam  No results found for any visits on 12/12/22.      Assessment & Plan:   Problem List Items Addressed This Visit   None   No orders of the defined types were placed in this encounter.   No follow-ups on file.  Beatrice Lecher, MD

## 2022-12-13 ENCOUNTER — Encounter: Payer: Self-pay | Admitting: Family Medicine

## 2022-12-13 ENCOUNTER — Ambulatory Visit: Payer: Medicare HMO | Admitting: Family Medicine

## 2022-12-13 VITALS — BP 103/61 | HR 74 | Ht 63.0 in | Wt 160.0 lb

## 2022-12-13 DIAGNOSIS — J0110 Acute frontal sinusitis, unspecified: Secondary | ICD-10-CM | POA: Diagnosis not present

## 2022-12-13 MED ORDER — AZITHROMYCIN 250 MG PO TABS: ORAL_TABLET | ORAL | 0 refills | Status: AC

## 2022-12-13 NOTE — Progress Notes (Signed)
   Acute Office Visit  Subjective:     Patient ID: Belinda Owens, female    DOB: Mar 09, 1932, 89 y.o.   MRN: [id number]  Chief Complaint  Patient presents with   Sinusitis    She report that her sxs began 3-4 days ago. Started with a headache, she stated that her head feels heavy, she has post nasal drip, and runny nose. Taking Extra strength Tylenol. Denies any fevers    HPI Patient is in today for She report that her sxs began 4 days ago. Started with a headache, she stated that her head feels heavy, she has post nasal drip, and runny nose. Taking Extra strength Tylenol. Denies any fevers.  He is also been getting a film over her eyes that she is having to clean off they feel a little irritated.  No known sick contacts she says she pretty much stays home except from Friday nights she gets together with friends and plays scrabble.   ROS      Objective:    BP 103/61   Pulse 74   Ht 5' 3"$  (1.6 m)   Wt 160 lb (72.6 kg)   BMI 28.34 kg/m    Physical Exam Constitutional:      Appearance: She is well-developed.  HENT:     Head: Normocephalic and atraumatic.     Right Ear: Tympanic membrane, ear canal and external ear normal.     Left Ear: Tympanic membrane, ear canal and external ear normal.     Nose: Nose normal.  Eyes:     Conjunctiva/sclera: Conjunctivae normal.     Pupils: Pupils are equal, round, and reactive to light.     Comments: Left lower eyelid is erythematous.  And there is a little bit of discharge in the medial corner of the left eye.  Neck:     Thyroid: No thyromegaly.  Cardiovascular:     Rate and Rhythm: Normal rate and regular rhythm.     Heart sounds: Normal heart sounds.  Pulmonary:     Effort: Pulmonary effort is normal.     Breath sounds: Normal breath sounds. No wheezing.  Musculoskeletal:     Cervical back: Neck supple.  Lymphadenopathy:     Cervical: No cervical adenopathy.  Skin:    General: Skin is warm and dry.  Neurological:     Mental  Status: She is alert and oriented to person, place, and time.     No results found for any visits on 12/13/22.      Assessment & Plan:   Problem List Items Addressed This Visit   None Visit Diagnoses     Acute non-recurrent frontal sinusitis    -  Primary   Relevant Medications   azithromycin (ZITHROMAX) 250 MG tablet      Acute sinusitis-we did discuss that this could also be her recurring sinus headaches that she does get but then she also has a lot of drainage and conjunctivitis and can go and treat with azithromycin.  Meds ordered this encounter  Medications   azithromycin (ZITHROMAX) 250 MG tablet    Sig: 2 Ttabs PO on Day 1, then one a day x 4 days.    Dispense:  6 tablet    Refill:  0    No follow-ups on file.  Beatrice Lecher, MD

## 2023-01-12 ENCOUNTER — Ambulatory Visit (INDEPENDENT_AMBULATORY_CARE_PROVIDER_SITE_OTHER): Payer: Medicare HMO | Admitting: Sports Medicine

## 2023-01-12 ENCOUNTER — Other Ambulatory Visit (INDEPENDENT_AMBULATORY_CARE_PROVIDER_SITE_OTHER): Payer: Medicare HMO

## 2023-01-12 DIAGNOSIS — M65311 Trigger thumb, right thumb: Secondary | ICD-10-CM | POA: Insufficient documentation

## 2023-01-12 MED ORDER — TRIAMCINOLONE ACETONIDE 40 MG/ML IJ SUSP
40.0000 mg | Freq: Once | INTRAMUSCULAR | Status: AC
  Administered 2023-01-12: 40 mg via INTRAMUSCULAR

## 2023-01-12 NOTE — Assessment & Plan Note (Signed)
Several weeks of painful triggering right thumb, palpable flexor tendon nodule, today we did an FPL injection, home physical therapy given, return to see me in 6 weeks.

## 2023-01-12 NOTE — Progress Notes (Signed)
    Procedures performed today:    Procedure: Real-time Ultrasound Guided injection of the right flexor pollicis longus tendon sheath Device: Samsung HS60  Verbal informed consent obtained.  Time-out conducted.  Noted no overlying erythema, induration, or other signs of local infection.  Skin prepped in a sterile fashion.  Local anesthesia: Topical Ethyl chloride.  With sterile technique and under real time ultrasound guidance: Noted flexor tendon nodule, 1/2 cc lidocaine, 1/2 cc kenalog 40 injected easily. Completed without difficulty  Advised to call if fevers/chills, erythema, induration, drainage, or persistent bleeding.  Images permanently stored and available for review in PACS.  Impression: Technically successful ultrasound guided injection.  Independent interpretation of notes and tests performed by another provider:   None.  Brief History, Exam, Impression, and Recommendations:    Trigger thumb, right thumb Several weeks of painful triggering right thumb, palpable flexor tendon nodule, today we did an FPL injection, home physical therapy given, return to see me in 6 weeks.    ____________________________________________ Gwen Her. Dianah Field, M.D., ABFM., CAQSM., AME. Primary Care and Sports Medicine  [facility]  Adjunct Professor of [place] [employer] of Medicine  Risk manager

## 2023-01-13 ENCOUNTER — Other Ambulatory Visit: Payer: Self-pay | Admitting: Family Medicine

## 2023-01-13 DIAGNOSIS — I10 Essential (primary) hypertension: Secondary | ICD-10-CM

## 2023-01-15 ENCOUNTER — Other Ambulatory Visit: Payer: Self-pay | Admitting: *Deleted

## 2023-01-15 DIAGNOSIS — Z1231 Encounter for screening mammogram for malignant neoplasm of breast: Secondary | ICD-10-CM | POA: Diagnosis not present

## 2023-01-15 DIAGNOSIS — R92333 Mammographic heterogeneous density, bilateral breasts: Secondary | ICD-10-CM | POA: Diagnosis not present

## 2023-01-28 ENCOUNTER — Other Ambulatory Visit: Payer: Self-pay | Admitting: Family Medicine

## 2023-01-28 DIAGNOSIS — I10 Essential (primary) hypertension: Secondary | ICD-10-CM

## 2023-01-28 DIAGNOSIS — E876 Hypokalemia: Secondary | ICD-10-CM

## 2023-02-01 ENCOUNTER — Other Ambulatory Visit: Payer: Self-pay | Admitting: Family Medicine

## 2023-02-01 DIAGNOSIS — E785 Hyperlipidemia, unspecified: Secondary | ICD-10-CM

## 2023-02-13 ENCOUNTER — Other Ambulatory Visit: Payer: Self-pay | Admitting: Family Medicine

## 2023-02-13 DIAGNOSIS — I10 Essential (primary) hypertension: Secondary | ICD-10-CM

## 2023-02-13 DIAGNOSIS — E876 Hypokalemia: Secondary | ICD-10-CM

## 2023-02-23 DIAGNOSIS — E785 Hyperlipidemia, unspecified: Secondary | ICD-10-CM | POA: Diagnosis not present

## 2023-02-23 DIAGNOSIS — R7301 Impaired fasting glucose: Secondary | ICD-10-CM | POA: Diagnosis not present

## 2023-02-23 DIAGNOSIS — Z9189 Other specified personal risk factors, not elsewhere classified: Secondary | ICD-10-CM | POA: Diagnosis not present

## 2023-02-23 DIAGNOSIS — N6092 Unspecified benign mammary dysplasia of left breast: Secondary | ICD-10-CM | POA: Diagnosis not present

## 2023-02-23 DIAGNOSIS — I10 Essential (primary) hypertension: Secondary | ICD-10-CM | POA: Diagnosis not present

## 2023-02-24 LAB — COMPLETE METABOLIC PANEL WITH GFR
AG Ratio: 1.7 (calc) (ref 1.0–2.5)
ALT: 13 U/L (ref 6–29)
AST: 18 U/L (ref 10–35)
Albumin: 4.5 g/dL (ref 3.6–5.1)
Alkaline phosphatase (APISO): 53 U/L (ref 37–153)
BUN/Creatinine Ratio: 20 (calc) (ref 6–22)
BUN: 19 mg/dL (ref 7–25)
CO2: 30 mmol/L (ref 20–32)
Calcium: 10.2 mg/dL (ref 8.6–10.4)
Chloride: 97 mmol/L — ABNORMAL LOW (ref 98–110)
Creat: 0.97 mg/dL — ABNORMAL HIGH (ref 0.60–0.95)
Globulin: 2.6 g/dL (calc) (ref 1.9–3.7)
Glucose, Bld: 104 mg/dL — ABNORMAL HIGH (ref 65–99)
Potassium: 3.8 mmol/L (ref 3.5–5.3)
Sodium: 137 mmol/L (ref 135–146)
Total Bilirubin: 0.5 mg/dL (ref 0.2–1.2)
Total Protein: 7.1 g/dL (ref 6.1–8.1)
eGFR: 56 mL/min/{1.73_m2} — ABNORMAL LOW (ref >=60)

## 2023-02-24 LAB — CBC
HCT: 45.1 % — ABNORMAL HIGH (ref 35.0–45.0)
Hemoglobin: 14.9 g/dL (ref 11.7–15.5)
MCH: 28.7 pg (ref 27.0–33.0)
MCHC: 33 g/dL (ref 32.0–36.0)
MCV: 86.7 fL (ref 80.0–100.0)
MPV: 9.9 fL (ref 7.5–12.5)
Platelets: 343 10*3/uL (ref 140–400)
RBC: 5.2 10*6/uL — ABNORMAL HIGH (ref 3.80–5.10)
RDW: 14.1 % (ref 11.0–15.0)
WBC: 8.2 10*3/uL (ref 3.8–10.8)

## 2023-02-24 LAB — LIPID PANEL W/REFLEX DIRECT LDL
Cholesterol: 206 mg/dL — ABNORMAL HIGH (ref <200)
HDL: 75 mg/dL (ref >=50)
LDL Cholesterol (Calc): 94 mg/dL (calc)
Non-HDL Cholesterol (Calc): 131 mg/dL (calc) — ABNORMAL HIGH (ref <130)
Total CHOL/HDL Ratio: 2.7 (calc) (ref <5.0)
Triglycerides: 259 mg/dL — ABNORMAL HIGH (ref <150)

## 2023-02-26 ENCOUNTER — Ambulatory Visit (INDEPENDENT_AMBULATORY_CARE_PROVIDER_SITE_OTHER): Payer: Medicare HMO | Admitting: Sports Medicine

## 2023-02-26 ENCOUNTER — Encounter: Payer: Self-pay | Admitting: Sports Medicine

## 2023-02-26 VITALS — BP 115/77 | HR 98

## 2023-02-26 DIAGNOSIS — H8110 Benign paroxysmal vertigo, unspecified ear: Secondary | ICD-10-CM | POA: Diagnosis not present

## 2023-02-26 DIAGNOSIS — M65311 Trigger thumb, right thumb: Secondary | ICD-10-CM | POA: Diagnosis not present

## 2023-02-26 NOTE — Progress Notes (Signed)
Call Patient: Overall labs are stable.  Cholesterol looks just a little bit better which is great.

## 2023-02-26 NOTE — Assessment & Plan Note (Signed)
Occasional dizziness with head position changes, we discussed the anatomy and pathophysiology, she will call me if she desires to do vestibular rehab.

## 2023-02-26 NOTE — Assessment & Plan Note (Signed)
Doing better with right-sided trigger thumb postinjection at the last visit, she will do home conditioning and return to see me as needed for this.

## 2023-02-26 NOTE — Progress Notes (Signed)
    Procedures performed today:    None.  Independent interpretation of notes and tests performed by another provider:   None.  Brief History, Exam, Impression, and Recommendations:    Trigger thumb, right thumb Doing better with right-sided trigger thumb postinjection at the last visit, she will do home conditioning and return to see me as needed for this.  Benign positional vertigo Occasional dizziness with head position changes, we discussed the anatomy and pathophysiology, she will call me if she desires to do vestibular rehab.    ____________________________________________ Ihor Austin. Benjamin Stain, M.D., ABFM., CAQSM., AME. Primary Care and Sports Medicine [facility]  Adjunct Professor of Family Medicine  [place] [employer]  Restaurant manager, fast food

## 2023-03-01 ENCOUNTER — Telehealth: Payer: Self-pay | Admitting: Pharmacist

## 2023-03-01 NOTE — Progress Notes (Signed)
Care Coordination Call  Outreached patient to reschedule upcoming phone appointment due to provider schedule conflict.  Coordinated reschedule for 03/02/23 at 2:30 pm.  Lynnda Shields, PharmD, BCPS Clinical Pharmacist [facility] Primary Care

## 2023-03-02 ENCOUNTER — Other Ambulatory Visit: Payer: Medicare HMO | Admitting: Pharmacist

## 2023-03-02 NOTE — Progress Notes (Signed)
03/02/2023 Name: Belinda Owens MRN: [id number] DOB: Jul 31, 1932  Chief Complaint  Patient presents with   Hypertension   Hyperlipidemia    Belinda Owens is a 89 y.o. year old female who presented for a telephone visit.   They were referred to the pharmacist by  [facility]  for assistance in managing hypertension and hyperlipidemia. Patient is doing well with no concerns regarding health or medications at this time.   Subjective:  Care Team: Primary Care Provider: Agapito Games, MD   Medication Access/Adherence  Current Pharmacy:  [id number] - [place], [place] - [address] [place] [address] Phone: [phone] Fax: [phone]   Patient reports affordability concerns with their medications: No  Patient reports access/transportation concerns to their pharmacy: No  Patient reports adherence concerns with their medications:  No     Hypertension:  Current medications: losartan-hctz 100-25mg  daily, amlodipine 5mg  daily, metoprolol XL 25mg  daily Medications previously tried:   Patient has a validated, automated, upper arm home BP cuff Current blood pressure readings readings: 120-130s   Patient denies hypotensive s/sx including dizziness, lightheadedness.  Patient denies hypertensive symptoms including headache, chest pain, shortness of breath   Hyperlipidemia/ASCVD Risk Reduction  Current lipid lowering medications: pravastatin 20mg  daily  Objective:  Lab Results  Component Value Date   HGBA1C 5.9 (A) 09/11/2022    Lab Results  Component Value Date   CREATININE 0.97 (H) 02/23/2023   BUN 19 02/23/2023   NA 137 02/23/2023   K 3.8 02/23/2023   CL 97 (L) 02/23/2023   CO2 30 02/23/2023    Lab Results  Component Value Date   CHOL 206 (H) 02/23/2023   HDL 75 02/23/2023   LDLCALC 94 02/23/2023   TRIG 259 (H) 02/23/2023   CHOLHDL 2.7 02/23/2023    Medications Reviewed Today     Reviewed by Gabriel Carina, RPH (Pharmacist) on 03/02/23 at 1435  Med List Status: <None>   Medication Order Taking? Sig Documenting Provider Last Dose Status Informant  amLODipine (NORVASC) 5 MG tablet [id number] Yes TAKE 1 TABLET BY MOUTH DAILY Agapito Games, MD Taking Active   Calcium Carbonate-Vitamin D (CALCIUM 600+D PO) [id number] No Take 1 tablet by mouth daily.  Patient not taking: Reported on 03/02/2023   [provider] Not Taking Active   Cholecalciferol (VITAMIN D3) 1.25 MG (50000 UT) CAPS [id number] Yes Take 1 capsule by mouth daily. [provider] Taking Active   fluticasone (FLONASE) 50 MCG/ACT nasal spray [id number] Yes Place 1-2 sprays into both nostrils daily. For nasal congestion Agapito Games, MD Taking Active   losartan-hydrochlorothiazide [facility]) 100-25 MG tablet [id number] Yes TAKE 1 TABLET BY MOUTH DAILY Agapito Games, MD Taking Active   metoprolol succinate (TOPROL-XL) 25 MG 24 hr tablet [id number] Yes TAKE 1 TABLET BY MOUTH DAILY Agapito Games, MD Taking Active   Multiple Vitamins-Minerals (CENTRUM SILVER 50+WOMEN PO) [id number] Yes Take 1 tablet by mouth daily. [provider] Taking Active   Multiple Vitamins-Minerals (ZINC PO) [id number] Yes Take 50 mg by mouth daily. [provider] Taking Active   pravastatin (PRAVACHOL) 20 MG tablet [id number] Yes TAKE ONE TABLET BY MOUTH EVERY NIGHT AT BEDTIME Agapito Games, MD Taking Active   sertraline (ZOLOFT) 25 MG tablet [id number] Yes TAKE 1 TABLET BY MOUTH DAILY Agapito Games, MD Taking Active   vitamin B-12 (CYANOCOBALAMIN) 500 MCG tablet [id number] Yes Take 500 mcg by mouth daily. [provider] Taking Active   zolpidem (AMBIEN) 5 MG tablet [id number] Yes TAKE 1/2 TO 1 TABLET BY MOUTH EVERY NIGHT AT BEDTIME AS NEEDED FOR SLEEP Agapito Games, MD Taking Active               Assessment/Plan:   Hypertension: - Currently controlled - Recommend  to continue current regimen    Hyperlipidemia/ASCVD Risk Reduction: - Currently controlled.  - Recommend to continue current regimen    Follow Up Plan: PRN  Lynnda Shields, PharmD, BCPS Clinical Pharmacist [facility] Primary Care

## 2023-03-05 ENCOUNTER — Encounter: Payer: Self-pay | Admitting: Family Medicine

## 2023-03-05 ENCOUNTER — Ambulatory Visit (INDEPENDENT_AMBULATORY_CARE_PROVIDER_SITE_OTHER): Payer: Medicare HMO | Admitting: Family Medicine

## 2023-03-05 VITALS — BP 129/76 | HR 105 | Ht 63.0 in | Wt 162.0 lb

## 2023-03-05 DIAGNOSIS — I4891 Unspecified atrial fibrillation: Secondary | ICD-10-CM | POA: Diagnosis not present

## 2023-03-05 DIAGNOSIS — I10 Essential (primary) hypertension: Secondary | ICD-10-CM | POA: Diagnosis not present

## 2023-03-05 DIAGNOSIS — I499 Cardiac arrhythmia, unspecified: Secondary | ICD-10-CM | POA: Diagnosis not present

## 2023-03-05 MED ORDER — APIXABAN 2.5 MG PO TABS: 2.5000 mg | ORAL_TABLET | Freq: Two times a day (BID) | ORAL | 2 refills | Status: DC

## 2023-03-05 NOTE — Progress Notes (Signed)
Acute Office Visit  Subjective:     Patient ID: Belinda Owens, female    DOB: 02-24-1932, 89 y.o.   MRN: [id number]  Chief Complaint  Patient presents with   Hypertension         HPI Patient is in today for fluctuations in her BP.  She says she started checking her blood pressure on Friday because she was feeling a little lightheaded.  Most of her blood pressures look great, but they have been fluctuating.  Her BP dropped down to 97/57 with a pulse of 82 on Saturday and her highest pressure was 130/76 which was this morning.  But her pulse is also been fluctuating between 76 up to 108.  She denies any chest pain or shortness of breath palpitations.  She has not been sick.  No new medications such as NSAIDs or decongestants.  She is still taking all of her regular medications and has not changed anything or run out of anything recently.  She has not been sick recently and is otherwise felt well.  ROS      Objective:    BP 129/76   Pulse (!) 105   Ht 5\' 3"  (1.6 m)   Wt 162 lb (73.5 kg)   SpO2 95%   BMI 28.70 kg/m    Physical Exam Vitals and nursing note reviewed.  Constitutional:      Appearance: She is well-developed.  HENT:     Head: Normocephalic and atraumatic.  Cardiovascular:     Rate and Rhythm: Normal rate. Rhythm irregular.     Heart sounds: Normal heart sounds.  Pulmonary:     Effort: Pulmonary effort is normal.     Breath sounds: Normal breath sounds.  Skin:    General: Skin is warm and dry.  Neurological:     Mental Status: She is alert and oriented to person, place, and time.  Psychiatric:        Behavior: Behavior normal.     No results found for any visits on 03/05/23.      Assessment & Plan:   Problem List Items Addressed This Visit       Cardiovascular and Mediastinum   Essential hypertension    Blood pressure looks great here today but she has been worried that her diastolic has been elevated at home we discussed that bumping up the  metoprolol will help with her rate and maybe even her diastolic pressure but again it looks great here today.      Relevant Medications   apixaban (ELIQUIS) 2.5 MG TABS tablet   Atrial fibrillation (HCC)    EKG shows afib, likely x 3 days, that is when she started feeling more tired and occasionally dizzy.  Discussed diagnosis.  Start  Eliquis as well as increase in the metoprolol to 50 mg she has been getting some heart rates over 100 at rest.  Will refer to cardiology for further workup.  If there is good to be a delay to get her in then we can go ahead and proceed with echocardiogram etc.      Relevant Medications   apixaban (ELIQUIS) 2.5 MG TABS tablet   Other Relevant Orders   Ambulatory referral to Cardiology   Other Visit Diagnoses     Irregular heart rhythm    -  Primary   Relevant Orders   EKG 12-Lead      EKG shows rate of 97 bpm, with an irregular rhythm consistent with atrial fibrillation.   CHA2DS2-VASc Score =  4  The patient's score is based upon:    Signed,  Nani Gasser, MD    03/05/2023 2:38 PM    Meds ordered this encounter  Medications   apixaban (ELIQUIS) 2.5 MG TABS tablet    Sig: Take 1 tablet (2.5 mg total) by mouth 2 (two) times daily.    Dispense:  60 tablet    Refill:  2    No follow-ups on file.  Nani Gasser, MD

## 2023-03-05 NOTE — Assessment & Plan Note (Signed)
Blood pressure looks great here today but she has been worried that her diastolic has been elevated at home we discussed that bumping up the metoprolol will help with her rate and maybe even her diastolic pressure but again it looks great here today.

## 2023-03-05 NOTE — Addendum Note (Signed)
Addended by: Nani Gasser D on: 03/05/2023 02:38 PM   Modules accepted: Level of Service

## 2023-03-05 NOTE — Assessment & Plan Note (Signed)
EKG shows afib, likely x 3 days, that is when she started feeling more tired and occasionally dizzy.  Discussed diagnosis.  Start  Eliquis as well as increase in the metoprolol to 50 mg she has been getting some heart rates over 100 at rest.  Will refer to cardiology for further workup.  If there is good to be a delay to get her in then we can go ahead and proceed with echocardiogram etc.

## 2023-03-05 NOTE — Patient Instructions (Signed)
Ok to increase your metoprolol to 2 tabs daily/

## 2023-03-06 ENCOUNTER — Ambulatory Visit: Payer: Medicare HMO | Attending: Internal Medicine | Admitting: Internal Medicine

## 2023-03-06 ENCOUNTER — Encounter: Payer: Self-pay | Admitting: Internal Medicine

## 2023-03-06 ENCOUNTER — Ambulatory Visit: Payer: Medicare HMO | Admitting: Internal Medicine

## 2023-03-06 ENCOUNTER — Telehealth: Payer: Medicare HMO

## 2023-03-06 ENCOUNTER — Other Ambulatory Visit: Payer: Medicare HMO | Admitting: Pharmacist

## 2023-03-06 VITALS — BP 114/77 | HR 103 | Ht 64.0 in | Wt 162.8 lb

## 2023-03-06 DIAGNOSIS — I480 Paroxysmal atrial fibrillation: Secondary | ICD-10-CM | POA: Diagnosis not present

## 2023-03-06 NOTE — Progress Notes (Signed)
Cardiology Office Note:    Date:  03/06/2023   ID:  Belinda Owens, DOB [birth year], MRN [id number]  PCP:  Agapito Games, MD   [facility] Providers Cardiologist:  Maisie Fus, MD     Referring MD: Agapito Games, *   No chief complaint on file. Afib  History of Present Illness:    Belinda Owens is a 89 y.o. female HTN, referral for afib. I don't have any records. She was noted to be in afib with RVR with seeing her PCP. She is on eliquis and told to take metop XL 25 mg BID. She notes on Friday, just felt like not being active. She took her BP and noted her pulse was abnormal.  No apneic events that she knows of. No hx of sleep apnea. Occasional etoh. No smoking. No CP. Mild SOB. Blood pressure is well controlled.   Past Medical History:  Diagnosis Date   Fibrocystic breast changes of both breasts    Gets yearly mammo and [place]   History of rheumatic fever     Past Surgical History:  Procedure Laterality Date   BREAST BIOPSY     MENISCUS REPAIR Right    RECONSTRUCTION OF EYELID Bilateral 12/14/2016   ROTATOR CUFF REPAIR Right     Current Medications: Current Meds  Medication Sig   amLODipine (NORVASC) 5 MG tablet TAKE 1 TABLET BY MOUTH DAILY   apixaban (ELIQUIS) 2.5 MG TABS tablet Take 1 tablet (2.5 mg total) by mouth 2 (two) times daily.   Calcium Carbonate-Vitamin D (CALCIUM 600+D PO) Take 1 tablet by mouth daily.   Cholecalciferol (VITAMIN D3) 1.25 MG (50000 UT) CAPS Take 1 capsule by mouth daily.   fluticasone (FLONASE) 50 MCG/ACT nasal spray Place 1-2 sprays into both nostrils daily. For nasal congestion   losartan-hydrochlorothiazide (HYZAAR) 100-25 MG tablet TAKE 1 TABLET BY MOUTH DAILY   metoprolol succinate (TOPROL-XL) 25 MG 24 hr tablet TAKE 1 TABLET BY MOUTH DAILY   Multiple Vitamins-Minerals (CENTRUM SILVER 50+WOMEN PO) Take 1 tablet by mouth daily.   Multiple Vitamins-Minerals (ZINC PO) Take 50 mg by mouth daily.   pravastatin  (PRAVACHOL) 20 MG tablet TAKE ONE TABLET BY MOUTH EVERY NIGHT AT BEDTIME   sertraline (ZOLOFT) 25 MG tablet TAKE 1 TABLET BY MOUTH DAILY   vitamin B-12 (CYANOCOBALAMIN) 500 MCG tablet Take 500 mcg by mouth daily.   zolpidem (AMBIEN) 5 MG tablet TAKE 1/2 TO 1 TABLET BY MOUTH EVERY NIGHT AT BEDTIME AS NEEDED FOR SLEEP     Allergies:   Atorvastatin, Omeprazole, Tylenol with codeine #3  [acetaminophen-codeine], and Atrovent nasal spray [ipratropium]   Social History   Socioeconomic History   Marital status: Divorced    Spouse name: Not on file   Number of children: 3   Years of education: 13   Highest education level: Some college, no degree  Occupational History   Occupation: retired.     Comment: [employer]  Tobacco Use   Smoking status: Former    Types: Cigarettes    Quit date: 10/23/1957    Years since quitting: 65.4   Smokeless tobacco: Never  Vaping Use   Vaping Use: Never used  Substance and Sexual Activity   Alcohol use: Not Currently    Alcohol/week: 0.0 standard drinks of alcohol   Drug use: No   Sexual activity: Never    Partners: Male    Birth control/protection: None  Other Topics Concern   Not on file  Social History  Narrative   Quit smoking 1959. Lives alone. Had three children, one has deceased.   Social Determinants of Health   Financial Resource Strain: Low Risk  (04/02/2022)   Overall Financial Resource Strain (CARDIA)    Difficulty of Paying Living Expenses: Not hard at all  Food Insecurity: No Food Insecurity (04/02/2022)   Hunger Vital Sign    Worried About Running Out of Food in the Last Year: Never true    Ran Out of Food in the Last Year: Never true  Transportation Needs: No Transportation Needs (04/02/2022)   PRAPARE - Administrator, Civil Service (Medical): No    Lack of Transportation (Non-Medical): No  Physical Activity: Insufficiently Active (04/02/2022)   Exercise Vital Sign    Days of Exercise per Week: 1 day    Minutes of  Exercise per Session: 10 min  Stress: No Stress Concern Present (04/02/2022)   [employer] of Occupational Health - Occupational Stress Questionnaire    Feeling of Stress : Not at all  Social Connections: Moderately Integrated (04/02/2022)   Social Connection and Isolation Panel [NHANES]    Frequency of Communication with Friends and Family: Three times a week    Frequency of Social Gatherings with Friends and Family: Three times a week    Attends Religious Services: More than 4 times per year    Active Member of Clubs or Organizations: Yes    Attends Engineer, structural: More than 4 times per year    Marital Status: Divorced     Family History: The patient's family history includes Asthma in her daughter; Breast cancer in her maternal aunt; Cancer - Other in her brother; Colon cancer in her father; Heart disease in her mother; Uterine cancer in her mother.  ROS:   Please see the history of present illness.     All other systems reviewed and are negative.  EKGs/Labs/Other Studies Reviewed:    The following studies were reviewed today:   EKG:  EKG is  ordered today.  The ekg ordered today demonstrates   03/06/2023- afib HR 103 bpm  Recent Labs: 02/23/2023: ALT 13; BUN 19; Creat 0.97; Hemoglobin 14.9; Platelets 343; Potassium 3.8; Sodium 137   Recent Lipid Panel    Component Value Date/Time   CHOL 206 (H) 02/23/2023 1035   TRIG 259 (H) 02/23/2023 1035   HDL 75 02/23/2023 1035   CHOLHDL 2.7 02/23/2023 1035   VLDL 31 (H) 07/06/2016 1432   LDLCALC 94 02/23/2023 1035     Risk Assessment/Calculations:    CHA2DS2-VASc Score = 4   This indicates a 4.8% annual risk of stroke. The patient's score is based upon: CHF History: 0 HTN History: 1 Diabetes History: 0 Stroke History: 0 Vascular Disease History: 0 Age Score: 2 Gender Score: 1         Physical Exam:    VS:  BP 114/77 (BP Location: Left Arm, Patient Position: Sitting, Cuff Size: Normal)   Pulse  (!) 103   Ht 5\' 4"  (1.626 m)   Wt 162 lb 12.8 oz (73.8 kg)   SpO2 97%   BMI 27.94 kg/m     Wt Readings from Last 3 Encounters:  03/06/23 162 lb 12.8 oz (73.8 kg)  03/05/23 162 lb (73.5 kg)  12/13/22 160 lb (72.6 kg)     GEN:  Well nourished, well developed in no acute distress HEENT: Normal NECK: No JVD CARDIAC: RRR, no murmurs, rubs, gallops RESPIRATORY:  Clear to auscultation without rales, wheezing  or rhonchi  ABDOMEN: Soft, non-tender, non-distended MUSCULOSKELETAL:  No edema; No deformity  SKIN: Warm and dry NEUROLOGIC:  Alert and oriented x 3 PSYCHIATRIC:  Normal affect   ASSESSMENT:    Paroxysmal Atrial Fibrillation:  - on metop XL 25 mg, ok to take BID - continue eliquis 2.5 mg BID - plan below  HTN: continue current medications PLAN:    In order of problems listed above:  TTE DCCV in 3 weeks TSH Follow up 6 months      Shared Decision Making/Informed Consent The risks (stroke, cardiac arrhythmias rarely resulting in the need for a temporary or permanent pacemaker, skin irritation or burns and complications associated with conscious sedation including aspiration, arrhythmia, respiratory failure and death), benefits (restoration of normal sinus rhythm) and alternatives of a direct current cardioversion were explained in detail to Ms. Scheper and she agrees to proceed.      Medication Adjustments/Labs and Tests Ordered: Current medicines are reviewed at length with the patient today.  Concerns regarding medicines are outlined above.  Orders Placed This Encounter  Procedures   Basic metabolic panel   CBC   TSH   EKG 12-Lead   ECHOCARDIOGRAM COMPLETE   No orders of the defined types were placed in this encounter.   Patient Instructions  Medication Instructions:  No Changes In Medications at this time.   *If you need a refill on your cardiac medications before your next appointment, please call your pharmacy*  Lab Work: Please return for Blood Work  ON MAY 31st . No appointment needed, lab here at the office is open Monday-Friday from 8AM to 4PM and closed daily for lunch from 12:45-1:45.   If you have labs (blood work) drawn today and your tests are completely normal, you will receive your results only by: MyChart Message (if you have MyChart) OR A paper copy in the mail If you have any lab test that is abnormal or we need to change your treatment, we will call you to review the results.  Testing/Procedures: Your physician has recommended that you have a Cardioversion (DCCV). Electrical Cardioversion uses a jolt of electricity to your heart either through paddles or wired patches attached to your chest. This is a controlled, usually prescheduled, procedure. Defibrillation is done under light anesthesia in the hospital, and you usually go home the day of the procedure. This is done to get your heart back into a normal rhythm. You are not awake for the procedure. Please see the instruction sheet given to you today.  Your physician has requested that you have an echocardiogram. Echocardiography is a painless test that uses sound waves to create images of your heart. It provides your doctor with information about the size and shape of your heart and how well your heart's chambers and valves are working. You may receive an ultrasound enhancing agent through an IV if needed to better visualize your heart during the echo.This procedure takes approximately one hour. There are no restrictions for this procedure. This will take place at the [address], [address].   Follow-Up: At [facility], you and your health needs are our priority.  As part of our continuing mission to provide you with exceptional heart care, we have created designated Provider Care Teams.  These Care Teams include your primary Cardiologist (physician) and Advanced Practice Providers (APPs -  Physician Assistants and Nurse Practitioners) who all work together to provide  you with the care you need, when you need it.  Your next appointment:   6 month(s)  Provider:   Maisie Fus, MD     Other Instructions    Dear Belinda Owens  You are scheduled for a Cardioversion on Wednesday, June 5 with Dr. Duke Salvia.  Please arrive at the [facility] (Main Entrance A) at [facility]: [address] [place], [place] [address] at 7:30 AM (This time is 1 hour(s) before your procedure to ensure your preparation). Free valet parking service is available. You will check in at ADMITTING. The support person will be asked to wait in the waiting room.  It is OK to have someone drop you off and come back when you are ready to be discharged.     DIET:  Nothing to eat or drink after midnight except a sip of water with medications (see medication instructions below)  MEDICATION INSTRUCTIONS: !!IF ANY NEW MEDICATIONS ARE STARTED AFTER TODAY, PLEASE NOTIFY YOUR PROVIDER AS SOON AS POSSIBLE!!  FYI: Medications such as Semaglutide (Ozempic, [place]), Tirzepatide (Mounjaro, Zepbound), Dulaglutide (Trulicity), etc ("GLP1 agonists") must be held around the time of a procedure. Talk to your provider if you take one of these.       :1}Continue taking your anticoagulant (blood thinner): Apixaban (Eliquis).  You will need to continue this after your procedure until you are told by your provider that it is safe to stop.    LABS: MAY 31st   Come to [address]  FYI:  For your safety, and to allow [place] to monitor your vital signs accurately during the surgery/procedure we request: If you have artificial nails, gel coating, SNS etc, please have those removed prior to your surgery/procedure. Not having the nail coverings /polish removed may result in cancellation or delay of your surgery/procedure.  You must have a responsible person to drive you home and stay in the waiting area during your procedure. Failure to do so could result in cancellation.  Bring your insurance  cards.  *Special Note: Every effort is made to have your procedure done on time. Occasionally there are emergencies that occur at the hospital that may cause delays. Please be patient if a delay does occur.       Signed, Maisie Fus, MD  03/06/2023 4:32 PM    [facility]

## 2023-03-06 NOTE — H&P (View-Only) (Signed)
Cardiology Office Note:    Date:  03/06/2023   ID:  Teosha Mennen, DOB 05/16/1932, MRN [id number]  PCP:  Metheney, Catherine D, MD   [facility] Providers Cardiologist:  Christana Angelica E, MD     Referring MD: Metheney, Catherine D, *   No chief complaint on file. Afib  History of Present Illness:    Belinda Owens is a 89 y.o. female HTN, referral for afib. I don't have any records. She was noted to be in afib with RVR with seeing her PCP. She is on eliquis and told to take metop XL 25 mg BID. She notes on Friday, just felt like not being active. She took her BP and noted her pulse was abnormal.  No apneic events that she knows of. No hx of sleep apnea. Occasional etoh. No smoking. No CP. Mild SOB. Blood pressure is well controlled.   Past Medical History:  Diagnosis Date   Fibrocystic breast changes of both breasts    Gets yearly mammo and [place]   History of rheumatic fever     Past Surgical History:  Procedure Laterality Date   BREAST BIOPSY     MENISCUS REPAIR Right    RECONSTRUCTION OF EYELID Bilateral 12/14/2016   ROTATOR CUFF REPAIR Right     Current Medications: Current Meds  Medication Sig   amLODipine (NORVASC) 5 MG tablet TAKE 1 TABLET BY MOUTH DAILY   apixaban (ELIQUIS) 2.5 MG TABS tablet Take 1 tablet (2.5 mg total) by mouth 2 (two) times daily.   Calcium Carbonate-Vitamin D (CALCIUM 600+D PO) Take 1 tablet by mouth daily.   Cholecalciferol (VITAMIN D3) 1.25 MG (50000 UT) CAPS Take 1 capsule by mouth daily.   fluticasone (FLONASE) 50 MCG/ACT nasal spray Place 1-2 sprays into both nostrils daily. For nasal congestion   losartan-hydrochlorothiazide (HYZAAR) 100-25 MG tablet TAKE 1 TABLET BY MOUTH DAILY   metoprolol succinate (TOPROL-XL) 25 MG 24 hr tablet TAKE 1 TABLET BY MOUTH DAILY   Multiple Vitamins-Minerals (CENTRUM SILVER 50+WOMEN PO) Take 1 tablet by mouth daily.   Multiple Vitamins-Minerals (ZINC PO) Take 50 mg by mouth daily.   pravastatin  (PRAVACHOL) 20 MG tablet TAKE ONE TABLET BY MOUTH EVERY NIGHT AT BEDTIME   sertraline (ZOLOFT) 25 MG tablet TAKE 1 TABLET BY MOUTH DAILY   vitamin B-12 (CYANOCOBALAMIN) 500 MCG tablet Take 500 mcg by mouth daily.   zolpidem (AMBIEN) 5 MG tablet TAKE 1/2 TO 1 TABLET BY MOUTH EVERY NIGHT AT BEDTIME AS NEEDED FOR SLEEP     Allergies:   Atorvastatin, Omeprazole, Tylenol with codeine #3  [acetaminophen-codeine], and Atrovent nasal spray [ipratropium]   Social History   Socioeconomic History   Marital status: Divorced    Spouse name: Not on file   Number of children: 3   Years of education: 13   Highest education level: Some college, no degree  Occupational History   Occupation: retired.     Comment: [employer]  Tobacco Use   Smoking status: Former    Types: Cigarettes    Quit date: 10/23/1957    Years since quitting: 65.4   Smokeless tobacco: Never  Vaping Use   Vaping Use: Never used  Substance and Sexual Activity   Alcohol use: Not Currently    Alcohol/week: 0.0 standard drinks of alcohol   Drug use: No   Sexual activity: Never    Partners: Male    Birth control/protection: None  Other Topics Concern   Not on file  Social History   Narrative   Quit smoking 1959. Lives alone. Had three children, one has deceased.   Social Determinants of Health   Financial Resource Strain: Low Risk  (04/02/2022)   Overall Financial Resource Strain (CARDIA)    Difficulty of Paying Living Expenses: Not hard at all  Food Insecurity: No Food Insecurity (04/02/2022)   Hunger Vital Sign    Worried About Running Out of Food in the Last Year: Never true    Ran Out of Food in the Last Year: Never true  Transportation Needs: No Transportation Needs (04/02/2022)   PRAPARE - Transportation    Lack of Transportation (Medical): No    Lack of Transportation (Non-Medical): No  Physical Activity: Insufficiently Active (04/02/2022)   Exercise Vital Sign    Days of Exercise per Week: 1 day    Minutes of  Exercise per Session: 10 min  Stress: No Stress Concern Present (04/02/2022)   [employer]ational [employer] - Occupational Stress Questionnaire    Feeling of Stress : Not at all  Social Connections: Moderately Integrated (04/02/2022)   Social Connection and Isolation Panel [NHANES]    Frequency of Communication with Friends and Family: Three times a week    Frequency of Social Gatherings with Friends and Family: Three times a week    Attends Religious Services: More than 4 times per year    Active Member of Clubs or Organizations: Yes    Attends Club or Organization Meetings: More than 4 times per year    Marital Status: Divorced     Family History: The patient's family history includes Asthma in her daughter; Breast cancer in her maternal aunt; Cancer - Other in her brother; Colon cancer in her father; Heart disease in her mother; Uterine cancer in her mother.  ROS:   Please see the history of present illness.     All other systems reviewed and are negative.  EKGs/Labs/Other Studies Reviewed:    The following studies were reviewed today:   EKG:  EKG is  ordered today.  The ekg ordered today demonstrates   03/06/2023- afib HR 103 bpm  Recent Labs: 02/23/2023: ALT 13; BUN 19; Creat 0.97; Hemoglobin 14.9; Platelets 343; Potassium 3.8; Sodium 137   Recent Lipid Panel    Component Value Date/Time   CHOL 206 (H) 02/23/2023 1035   TRIG 259 (H) 02/23/2023 1035   HDL 75 02/23/2023 1035   CHOLHDL 2.7 02/23/2023 1035   VLDL 31 (H) 07/06/2016 1432   LDLCALC 94 02/23/2023 1035     Risk Assessment/Calculations:    CHA2DS2-VASc Score = 4   This indicates a 4.8% annual risk of stroke. The patient's score is based upon: CHF History: 0 HTN History: 1 Diabetes History: 0 Stroke History: 0 Vascular Disease History: 0 Age Score: 2 Gender Score: 1         Physical Exam:    VS:  BP 114/77 (BP Location: Left Arm, Patient Position: Sitting, Cuff Size: Normal)   Pulse  (!) 103   Ht 5' 4" (1.626 m)   Wt 162 lb 12.8 oz (73.8 kg)   SpO2 97%   BMI 27.94 kg/m     Wt Readings from Last 3 Encounters:  03/06/23 162 lb 12.8 oz (73.8 kg)  03/05/23 162 lb (73.5 kg)  12/13/22 160 lb (72.6 kg)     GEN:  Well nourished, well developed in no acute distress HEENT: Normal NECK: No JVD CARDIAC: RRR, no murmurs, rubs, gallops RESPIRATORY:  Clear to auscultation without rales, wheezing   or rhonchi  ABDOMEN: Soft, non-tender, non-distended MUSCULOSKELETAL:  No edema; No deformity  SKIN: Warm and dry NEUROLOGIC:  Alert and oriented x 3 PSYCHIATRIC:  Normal affect   ASSESSMENT:    Paroxysmal Atrial Fibrillation:  - on metop XL 25 mg, ok to take BID - continue eliquis 2.5 mg BID - plan below  HTN: continue current medications PLAN:    In order of problems listed above:  TTE DCCV in 3 weeks TSH Follow up 6 months      Shared Decision Making/Informed Consent The risks (stroke, cardiac arrhythmias rarely resulting in the need for a temporary or permanent pacemaker, skin irritation or burns and complications associated with conscious sedation including aspiration, arrhythmia, respiratory failure and death), benefits (restoration of normal sinus rhythm) and alternatives of a direct current cardioversion were explained in detail to Ms. Notch and she agrees to proceed.      Medication Adjustments/Labs and Tests Ordered: Current medicines are reviewed at length with the patient today.  Concerns regarding medicines are outlined above.  Orders Placed This Encounter  Procedures   Basic metabolic panel   CBC   TSH   EKG 12-Lead   ECHOCARDIOGRAM COMPLETE   No orders of the defined types were placed in this encounter.   Patient Instructions  Medication Instructions:  No Changes In Medications at this time.   *If you need a refill on your cardiac medications before your next appointment, please call your pharmacy*  Lab Work: Please return for Blood Work  ON MAY 31st . No appointment needed, lab here at the office is open Monday-Friday from 8AM to 4PM and closed daily for lunch from 12:45-1:45.   If you have labs (blood work) drawn today and your tests are completely normal, you will receive your results only by: MyChart Message (if you have MyChart) OR A paper copy in the mail If you have any lab test that is abnormal or we need to change your treatment, we will call you to review the results.  Testing/Procedures: Your physician has recommended that you have a Cardioversion (DCCV). Electrical Cardioversion uses a jolt of electricity to your heart either through paddles or wired patches attached to your chest. This is a controlled, usually prescheduled, procedure. Defibrillation is done under light anesthesia in the hospital, and you usually go home the day of the procedure. This is done to get your heart back into a normal rhythm. You are not awake for the procedure. Please see the instruction sheet given to you today.  Your physician has requested that you have an echocardiogram. Echocardiography is a painless test that uses sound waves to create images of your heart. It provides your doctor with information about the size and shape of your heart and how well your heart's chambers and valves are working. You may receive an ultrasound enhancing agent through an IV if needed to better visualize your heart during the echo.This procedure takes approximately one hour. There are no restrictions for this procedure. This will take place at the [address], [address].   Follow-Up: At [facility], you and your health needs are our priority.  As part of our continuing mission to provide you with exceptional heart care, we have created designated Provider Care Teams.  These Care Teams include your primary Cardiologist (physician) and Advanced Practice Providers (APPs -  Physician Assistants and Nurse Practitioners) who all work together to provide  you with the care you need, when you need it.    Your next appointment:   6 month(s)  Provider:   Genevia Bouldin E, MD     Other Instructions    Dear Little Curling  You are scheduled for a Cardioversion on Wednesday, June 5 with Dr. Maumelle.  Please arrive at the [facility] (Main Entrance A) at [facility]: [address] [place], [place] [address] at 7:30 AM (This time is 1 hour(s) before your procedure to ensure your preparation). Free valet parking service is available. You will check in at ADMITTING. The support person will be asked to wait in the waiting room.  It is OK to have someone drop you off and come back when you are ready to be discharged.     DIET:  Nothing to eat or drink after midnight except a sip of water with medications (see medication instructions below)  MEDICATION INSTRUCTIONS: !!IF ANY NEW MEDICATIONS ARE STARTED AFTER TODAY, PLEASE NOTIFY YOUR PROVIDER AS SOON AS POSSIBLE!!  FYI: Medications such as Semaglutide (Ozempic, [place]), Tirzepatide (Mounjaro, Zepbound), Dulaglutide (Trulicity), etc ("GLP1 agonists") must be held around the time of a procedure. Talk to your provider if you take one of these.       :1}Continue taking your anticoagulant (blood thinner): Apixaban (Eliquis).  You will need to continue this after your procedure until you are told by your provider that it is safe to stop.    LABS: MAY 31st   Come to [address]  FYI:  For your safety, and to allow us to monitor your vital signs accurately during the surgery/procedure we request: If you have artificial nails, gel coating, SNS etc, please have those removed prior to your surgery/procedure. Not having the nail coverings /polish removed may result in cancellation or delay of your surgery/procedure.  You must have a responsible person to drive you home and stay in the waiting area during your procedure. Failure to do so could result in cancellation.  Bring your insurance  cards.  *Special Note: Every effort is made to have your procedure done on time. Occasionally there are emergencies that occur at the hospital that may cause delays. Please be patient if a delay does occur.       Signed, Cecil Bixby E, MD  03/06/2023 4:32 PM    [facility] 

## 2023-03-06 NOTE — Patient Instructions (Addendum)
Medication Instructions:  No Changes In Medications at this time.   *If you need a refill on your cardiac medications before your next appointment, please call your pharmacy*  Lab Work: Please return for Blood Work ON MAY 31st . No appointment needed, lab here at the office is open Monday-Friday from 8AM to 4PM and closed daily for lunch from 12:45-1:45.   If you have labs (blood work) drawn today and your tests are completely normal, you will receive your results only by: MyChart Message (if you have MyChart) OR A paper copy in the mail If you have any lab test that is abnormal or we need to change your treatment, we will call you to review the results.  Testing/Procedures: Your physician has recommended that you have a Cardioversion (DCCV). Electrical Cardioversion uses a jolt of electricity to your heart either through paddles or wired patches attached to your chest. This is a controlled, usually prescheduled, procedure. Defibrillation is done under light anesthesia in the hospital, and you usually go home the day of the procedure. This is done to get your heart back into a normal rhythm. You are not awake for the procedure. Please see the instruction sheet given to you today.  Your physician has requested that you have an echocardiogram. Echocardiography is a painless test that uses sound waves to create images of your heart. It provides your doctor with information about the size and shape of your heart and how well your heart's chambers and valves are working. You may receive an ultrasound enhancing agent through an IV if needed to better visualize your heart during the echo.This procedure takes approximately one hour. There are no restrictions for this procedure. This will take place at the [address], [address].   Follow-Up: At [facility], you and your health needs are our priority.  As part of our continuing mission to provide you with exceptional heart care, we have  created designated Provider Care Teams.  These Care Teams include your primary Cardiologist (physician) and Advanced Practice Providers (APPs -  Physician Assistants and Nurse Practitioners) who all work together to provide you with the care you need, when you need it.  Your next appointment:   6 month(s)  Provider:   Maisie Fus, MD     Other Instructions    Dear Belinda Owens  You are scheduled for a Cardioversion on Wednesday, June 5 with Dr. Duke Salvia.  Please arrive at the [facility] (Main Entrance A) at [facility]: [address] [place], [place] [address] at 7:30 AM (This time is 1 hour(s) before your procedure to ensure your preparation). Free valet parking service is available. You will check in at ADMITTING. The support person will be asked to wait in the waiting room.  It is OK to have someone drop you off and come back when you are ready to be discharged.     DIET:  Nothing to eat or drink after midnight except a sip of water with medications (see medication instructions below)  MEDICATION INSTRUCTIONS: !!IF ANY NEW MEDICATIONS ARE STARTED AFTER TODAY, PLEASE NOTIFY YOUR PROVIDER AS SOON AS POSSIBLE!!  FYI: Medications such as Semaglutide (Ozempic, [place]), Tirzepatide (Mounjaro, Zepbound), Dulaglutide (Trulicity), etc ("GLP1 agonists") must be held around the time of a procedure. Talk to your provider if you take one of these.       :1}Continue taking your anticoagulant (blood thinner): Apixaban (Eliquis).  You will need to continue this after your procedure  until you are told by your provider that it is safe to stop.    LABS: MAY 31st   Come to [address]  FYI:  For your safety, and to allow [place] to monitor your vital signs accurately during the surgery/procedure we request: If you have artificial nails, gel coating, SNS etc, please have those removed prior to your surgery/procedure. Not having the nail coverings /polish removed may result in cancellation or  delay of your surgery/procedure.  You must have a responsible person to drive you home and stay in the waiting area during your procedure. Failure to do so could result in cancellation.  Bring your insurance cards.  *Special Note: Every effort is made to have your procedure done on time. Occasionally there are emergencies that occur at the hospital that may cause delays. Please be patient if a delay does occur.

## 2023-03-08 ENCOUNTER — Telehealth: Payer: Self-pay | Admitting: Family Medicine

## 2023-03-08 NOTE — Telephone Encounter (Signed)
Pt called.  She is still not feeling well sometimes she gets hot(feels like hot flashes) she is just feeling blah.

## 2023-03-09 ENCOUNTER — Ambulatory Visit
Admission: EM | Admit: 2023-03-09 | Discharge: 2023-03-09 | Disposition: A | Discharge status: Home / Self Care | Payer: Medicare HMO

## 2023-03-09 DIAGNOSIS — I480 Paroxysmal atrial fibrillation: Secondary | ICD-10-CM | POA: Diagnosis not present

## 2023-03-09 HISTORY — DX: Essential (primary) hypertension: I10

## 2023-03-09 NOTE — Telephone Encounter (Signed)
Pt has an appointment Monday 

## 2023-03-09 NOTE — ED Triage Notes (Signed)
Pt presents to uc with co of high pulse rate in the 100s this morning. Denies any palpitations. Denies any pain.

## 2023-03-09 NOTE — ED Provider Notes (Signed)
[facility]    CSN: [id number] Arrival date & time: 03/09/23  1528      History   Chief Complaint Chief Complaint  Patient presents with   Tachycardia    HPI Belinda Owens is a 89 y.o. female.   HPI Very pleasant 89 year old female presents with concern for elevated heart rate this morning at her home; she reports seeing heart rate in the 100's.  PMH significant for TIA, CKD and newly diagnosed paroxysmal atrial fibrillation.  Patient was evaluated for irregular heart rhythm by PCP on 03/05/23 then evaluated by Cardiology the following day 03/06/2023 and diagnosed with paroxysmal atrial fibrillation.  Patient has pending cardioversion scheduled for 03/28/23.  Patient denies anginal equivalents, shortness of breath, lightheadedness, dizziness, changes in visual acuity, bilateral lower extremity edema, headache, presyncopal or syncopal episodes.  Patient reports was started on Eliquis 2 days ago by her PCP.  Patient denies any unusual bleeding.  Past Medical History:  Diagnosis Date   Fibrocystic breast changes of both breasts    Gets yearly mammo and [place]   History of rheumatic fever    Hypertension     Patient Active Problem List   Diagnosis Date Noted   Atrial fibrillation (HCC) 03/05/2023   Benign positional vertigo 02/26/2023   Trigger thumb, right thumb 01/12/2023   IFG (impaired fasting glucose) 03/13/2022   Mass of leg, left 04/22/2021   Migraine 03/08/2021   History of TIA (transient ischemic attack) 01/10/2021   Primary osteoarthritis of right wrist 10/02/2018   De Quervain's tenosynovitis, right 10/02/2018   CKD (chronic kidney disease) stage 2, GFR 60-89 ml/min 08/18/2018   Myalgia due to statin 02/19/2018   Adenoma of duodenum 04/10/2017   Dermatochalasis of both upper eyelids 12/14/2016   Atypical ductal hyperplasia of left breast 12/01/2016   Sedentary lifestyle 12/01/2016   Class 1 drug-induced obesity without serious comorbidity with body mass index  (BMI) of 31.0 to 31.9 in adult 12/01/2016   At risk for breast cancer 12/01/2016   Primary osteoarthritis of left knee 06/08/2016   Osteopenia 09/29/2015   Ear ringing sound 04/27/2015   Bilateral shoulder pain 03/16/2015   Degenerative disc disease, cervical 03/16/2015   Insomnia 03/09/2015   Hyperlipidemia 02/03/2015   Essential hypertension 02/03/2015   DDD (degenerative disc disease), lumbar 02/03/2015   GERD (gastroesophageal reflux disease) 02/03/2015   Anxiety state 02/03/2015   Fibrocystic breast changes of both breasts 02/03/2015   Microscopic hematuria 02/03/2015   Allergic bronchitis 02/03/2015    Past Surgical History:  Procedure Laterality Date   BREAST BIOPSY     MENISCUS REPAIR Right    RECONSTRUCTION OF EYELID Bilateral 12/14/2016   ROTATOR CUFF REPAIR Right     OB History     Gravida  3   Para  3   Term  3   Preterm      AB      Living         SAB      IAB      Ectopic      Multiple      Live Births  3            Home Medications    Prior to Admission medications   Medication Sig Start Date End Date Taking? Authorizing Provider  amLODipine (NORVASC) 5 MG tablet TAKE 1 TABLET BY MOUTH DAILY 01/15/23   Agapito Games, MD  apixaban (ELIQUIS) 2.5 MG TABS tablet Take 1 tablet (2.5 mg total) by  mouth 2 (two) times daily. 03/05/23   Agapito Games, MD  Calcium Carbonate-Vitamin D (CALCIUM 600+D PO) Take 1 tablet by mouth daily.    [provider]  Cholecalciferol (VITAMIN D3) 1.25 MG (50000 UT) CAPS Take 1 capsule by mouth daily.    [provider]  fluticasone (FLONASE) 50 MCG/ACT nasal spray Place 1-2 sprays into both nostrils daily. For nasal congestion 12/09/21   Agapito Games, MD  losartan-hydrochlorothiazide [facility]) 100-25 MG tablet TAKE 1 TABLET BY MOUTH DAILY 01/15/23   Agapito Games, MD  metoprolol succinate (TOPROL-XL) 25 MG 24 hr tablet TAKE 1 TABLET BY MOUTH DAILY 11/13/22   Agapito Games, MD  Multiple Vitamins-Minerals (CENTRUM SILVER 50+WOMEN PO) Take 1 tablet by mouth daily.    [provider]  Multiple Vitamins-Minerals (ZINC PO) Take 50 mg by mouth daily.    [provider]  pravastatin (PRAVACHOL) 20 MG tablet TAKE ONE TABLET BY MOUTH EVERY NIGHT AT BEDTIME 02/01/23   Agapito Games, MD  sertraline (ZOLOFT) 25 MG tablet TAKE 1 TABLET BY MOUTH DAILY 02/13/23   Agapito Games, MD  vitamin B-12 (CYANOCOBALAMIN) 500 MCG tablet Take 500 mcg by mouth daily.    [provider]  zolpidem (AMBIEN) 5 MG tablet TAKE 1/2 TO 1 TABLET BY MOUTH EVERY NIGHT AT BEDTIME AS NEEDED FOR SLEEP 01/30/23   Agapito Games, MD    Family History Family History  Problem Relation Age of Onset   Breast cancer Maternal Aunt    Colon cancer Father    Asthma Daughter    Heart disease Mother    Uterine cancer Mother    Cancer - Other Brother     Social History Social History   Tobacco Use   Smoking status: Former    Types: Cigarettes    Quit date: 10/23/1957    Years since quitting: 65.4   Smokeless tobacco: Never  Vaping Use   Vaping Use: Never used  Substance Use Topics   Alcohol use: Not Currently    Alcohol/week: 0.0 standard drinks of alcohol   Drug use: No     Allergies   Atorvastatin, Omeprazole, Tylenol with codeine #3  [acetaminophen-codeine], Atrovent nasal spray [ipratropium], and Sulfa antibiotics   Review of Systems Review of Systems  Cardiovascular:        Concerned with elevated heart rate for 1-2 days     Physical Exam Triage Vital Signs ED Triage Vitals  Enc Vitals Group     BP 03/09/23 1551 100/69     Pulse Rate 03/09/23 1551 95     Resp 03/09/23 1551 16     Temp 03/09/23 1551 97.6 F (36.4 C)     Temp src --      SpO2 03/09/23 1551 98 %     Weight --      Height --      Head Circumference --      Peak Flow --      Pain Score 03/09/23 1550 0     Pain Loc --      Pain Edu? --      Excl. in  GC? --    No data found.  Updated Vital Signs BP 100/69   Pulse 95   Temp 97.6 F (36.4 C)   Resp 16   SpO2 98%       Physical Exam Vitals and nursing note reviewed.  Constitutional:      Appearance: Normal appearance. She is  normal weight.  HENT:     Head: Normocephalic and atraumatic.     Mouth/Throat:     Mouth: Mucous membranes are moist.     Pharynx: Oropharynx is clear.  Eyes:     Extraocular Movements: Extraocular movements intact.     Conjunctiva/sclera: Conjunctivae normal.     Pupils: Pupils are equal, round, and reactive to light.  Neck:     Comments: No JVD, no bruit Cardiovascular:     Pulses: Normal pulses.     Comments: Irregularly irregular Pulmonary:     Effort: Pulmonary effort is normal. No respiratory distress.     Breath sounds: Normal breath sounds. No wheezing, rhonchi or rales.  Chest:     Chest wall: No tenderness.  Musculoskeletal:        General: Normal range of motion.     Cervical back: Normal range of motion.  Skin:    General: Skin is warm and dry.  Neurological:     General: No focal deficit present.     Mental Status: She is alert and oriented to person, place, and time. Mental status is at baseline.      UC Treatments / Results  Labs (all labs ordered are listed, but only abnormal results are displayed) Labs Reviewed - No data to display  EKG   Radiology No results found.  Procedures Procedures (including critical care time)  Medications Ordered in UC Medications - No data to display  Initial Impression / Assessment and Plan / UC Course  I have reviewed the triage vital signs and the nursing notes.  Pertinent labs & imaging results that were available during my care of the patient were reviewed by me and considered in my medical decision making (see chart for details).     MDM: 1.  Paroxysmal atrial fibrillation-irregularly irregular HR on exam, otherwise normal physical exam. Advised patient to take  medications as directed.  Instructed patient she should take all 3 blood pressure medications Hyzaar, Toprol XL, and Norvasc in the morning with first Eliquis tablet, then another Eliquis tablet in the afternoon early evening.  Advised patient to follow-up with PCP, cardiology, or here if symptoms worsen and/or concerns/questions arise.  Patient discharged home, hemodynamically stable. Final Clinical Impressions(s) / UC Diagnoses   Final diagnoses:  Paroxysmal atrial fibrillation [facility])     Discharge Instructions      Advised patient to take medications as directed.  Instructed patient she should take all 3 blood pressure medications Hyzaar, Toprol XL, and Norvasc in the morning with first Eliquis tablet, then another Eliquis tablet in the afternoon early evening.  Advised patient to follow-up with PCP, cardiology, or here if symptoms worsen and/or concerns/questions arise.     ED Prescriptions   None    PDMP not reviewed this encounter.   Trevor Iha, FNP 03/09/23 1715

## 2023-03-09 NOTE — Discharge Instructions (Addendum)
Advised patient to take medications as directed.  Instructed patient she should take all 3 blood pressure medications Hyzaar, Toprol XL, and Norvasc in the morning with first Eliquis tablet, then another Eliquis tablet in the afternoon early evening.  Advised patient to follow-up with PCP, cardiology, or here if symptoms worsen and/or concerns/questions arise.

## 2023-03-12 ENCOUNTER — Encounter: Payer: Self-pay | Admitting: Family Medicine

## 2023-03-12 ENCOUNTER — Ambulatory Visit (INDEPENDENT_AMBULATORY_CARE_PROVIDER_SITE_OTHER): Payer: Medicare HMO | Admitting: Family Medicine

## 2023-03-12 ENCOUNTER — Telehealth: Payer: Self-pay | Admitting: Family Medicine

## 2023-03-12 VITALS — BP 100/64 | HR 95 | Ht 64.0 in | Wt 158.0 lb

## 2023-03-12 DIAGNOSIS — R7301 Impaired fasting glucose: Secondary | ICD-10-CM | POA: Diagnosis not present

## 2023-03-12 DIAGNOSIS — Z0000 Encounter for general adult medical examination without abnormal findings: Secondary | ICD-10-CM | POA: Diagnosis not present

## 2023-03-12 LAB — POCT GLYCOSYLATED HEMOGLOBIN (HGB A1C): Hemoglobin A1C: 5.8 % — AB (ref 4.0–5.6)

## 2023-03-12 NOTE — Progress Notes (Signed)
Complete physical exam  Patient: Belinda Owens   DOB: [birth year]   89 y.o. Female  MRN: [id number]  Subjective:    Chief Complaint  Patient presents with   Annual Exam    Belinda Owens is a 89 y.o. female who presents today for a complete physical exam. She reports consuming a general diet. The patient does not participate in regular exercise at present. She generally feels fairly well. She reports sleeping fairly well. She does not have additional problems to discuss today.  He is just felt tired since she was diagnosed with A-fib and placed on metoprolol she is scheduled for an ablation on June 5 and is currently on Eliquis as well.   Most recent fall risk assessment:    03/05/2023    1:07 PM  Fall Risk   Falls in the past year? 0  Number falls in past yr: 0  Injury with Fall? 0  Risk for fall due to : No Fall Risks  Follow up Falls evaluation completed     Most recent depression screenings:    04/03/2022    1:13 PM 02/20/2022    3:24 PM  PHQ 2/9 Scores  PHQ - 2 Score 0 0         Patient Care Team: Agapito Games, MD as PCP - General (Family Medicine) Maisie Fus, MD as PCP - Cardiology (Cardiology) Gabriel Carina, [facility] as Pharmacist (Pharmacist)   Outpatient Medications Prior to Visit  Medication Sig   amLODipine (NORVASC) 5 MG tablet TAKE 1 TABLET BY MOUTH DAILY   apixaban (ELIQUIS) 2.5 MG TABS tablet Take 1 tablet (2.5 mg total) by mouth 2 (two) times daily.   Calcium Carbonate-Vitamin D (CALCIUM 600+D PO) Take 1 tablet by mouth daily.   Cholecalciferol (VITAMIN D3) 1.25 MG (50000 UT) CAPS Take 1 capsule by mouth daily.   fluticasone (FLONASE) 50 MCG/ACT nasal spray Place 1-2 sprays into both nostrils daily. For nasal congestion   losartan-hydrochlorothiazide (HYZAAR) 100-25 MG tablet TAKE 1 TABLET BY MOUTH DAILY   metoprolol succinate (TOPROL-XL) 25 MG 24 hr tablet TAKE 1 TABLET BY MOUTH DAILY (Patient taking differently: Take 50 mg by mouth daily.)    Multiple Vitamins-Minerals (CENTRUM SILVER 50+WOMEN PO) Take 1 tablet by mouth daily.   Multiple Vitamins-Minerals (ZINC PO) Take 50 mg by mouth daily.   pravastatin (PRAVACHOL) 20 MG tablet TAKE ONE TABLET BY MOUTH EVERY NIGHT AT BEDTIME   sertraline (ZOLOFT) 25 MG tablet TAKE 1 TABLET BY MOUTH DAILY   vitamin B-12 (CYANOCOBALAMIN) 500 MCG tablet Take 500 mcg by mouth daily.   zolpidem (AMBIEN) 5 MG tablet TAKE 1/2 TO 1 TABLET BY MOUTH EVERY NIGHT AT BEDTIME AS NEEDED FOR SLEEP   No facility-administered medications prior to visit.    ROS        Objective:     BP 100/64   Pulse 95   Ht 5\' 4"  (1.626 m)   Wt 158 lb (71.7 kg)   SpO2 97%   BMI 27.12 kg/m     Physical Exam Vitals and nursing note reviewed.  Constitutional:      Appearance: She is well-developed.  HENT:     Head: Normocephalic and atraumatic.     Right Ear: External ear normal.     Left Ear: External ear normal.     Nose: Nose normal.  Eyes:     Conjunctiva/sclera: Conjunctivae normal.     Pupils: Pupils are equal, round, and reactive to light.  Neck:     Thyroid: No thyromegaly.  Cardiovascular:     Rate and Rhythm: Normal rate. Rhythm irregular.     Heart sounds: Normal heart sounds.  Pulmonary:     Effort: Pulmonary effort is normal.     Breath sounds: Normal breath sounds. No wheezing.  Abdominal:     General: Bowel sounds are normal.     Palpations: Abdomen is soft.  Musculoskeletal:     Cervical back: Neck supple.  Lymphadenopathy:     Cervical: No cervical adenopathy.  Skin:    General: Skin is warm and dry.  Neurological:     Mental Status: She is alert and oriented to person, place, and time.  Psychiatric:        Mood and Affect: Mood normal.        Behavior: Behavior normal.      Results for orders placed or performed in visit on 03/12/23  POCT glycosylated hemoglobin (Hb A1C)  Result Value Ref Range   Hemoglobin A1C 5.8 (A) 4.0 - 5.6 %   HbA1c POC (<> result, manual entry)      HbA1c, POC (prediabetic range)     HbA1c, POC (controlled diabetic range)          Assessment & Plan:    Routine Health Maintenance and Physical Exam  Immunization History  Administered Date(s) Administered   Fluad Quad(high Dose 65+) 07/15/2019, 09/19/2021, 09/11/2022   Influenza, High Dose Seasonal PF 07/16/2017, 07/17/2018   Influenza,inj,Quad PF,6+ Mos 08/05/2015, 07/06/2016   Influenza-Unspecified 07/15/2020   Moderna Sars-Covid-2 Vaccination 01/01/2020, 02/03/2020   PFIZER(Purple Top)SARS-COV-2 Vaccination 07/15/2020   PNEUMOCOCCAL CONJUGATE-20 03/10/2022   Pneumococcal Conjugate-13 08/19/2015   Pneumococcal Polysaccharide-23 01/04/2017   Tdap 03/23/2014    Health Maintenance  Topic Date Due   Medicare Annual Wellness (AWV)  04/04/2023   Zoster Vaccines- Shingrix (1 of 2) 06/05/2023 (Originally 10/13/1951)   COVID-19 Vaccine (4 - 2023-24 season) 09/12/2023 (Originally 06/23/2022)   INFLUENZA VACCINE  05/24/2023   DTaP/Tdap/Td (2 - Td or Tdap) 03/23/2024   Pneumonia Vaccine 56+ Years old  Completed   DEXA SCAN  Completed   HPV VACCINES  Aged Out    Discussed health benefits of physical activity, and encouraged her to engage in regular exercise appropriate for her age and condition.  Problem List Items Addressed This Visit       Endocrine   IFG (impaired fasting glucose)   Relevant Orders   POCT glycosylated hemoglobin (Hb A1C) (Completed)   Other Visit Diagnoses     Wellness examination    -  Primary   Relevant Orders   POCT glycosylated hemoglobin (Hb A1C) (Completed)      No follow-ups on file.   Keep up a regular exercise program and make sure you are eating a healthy diet Try to eat 4 servings of dairy a day, or if you are lactose intolerant take a calcium with vitamin D daily.  Your vaccines are up to date.  Encouraged her to get her shingles vaccine at the pharmacy.  She says she is not opposed to it she just keeps forgetting about it and it is  free under her Medicare part D at the pharmacy.  Pneumonia and Tdap are up-to-date.  Sent labs are up-to-date except for her A1c so we just did a fingerstick.  It stable at 5.8 today.  Lab Results  Component Value Date   HGBA1C 5.8 (A) 03/12/2023     This SmartLink has not been configured with  any valid records.   a  Nani Gasser, MD

## 2023-03-12 NOTE — Progress Notes (Signed)
   Established Patient Office Visit  Subjective   Patient ID: Belinda Owens, female    DOB: 1932-09-07  Age: 89 y.o. MRN: [id number]  Chief Complaint  Patient presents with   Annual Exam    HPI    ROS    Objective:     BP 100/64   Pulse 95   Ht 5\' 4"  (1.626 m)   Wt 158 lb (71.7 kg)   SpO2 97%   BMI 27.12 kg/m    Physical Exam   No results found for any visits on 03/12/23.    The ASCVD Risk score (Arnett DK, et al., 2019) failed to calculate for the following reasons:   The 2019 ASCVD risk score is only valid for ages 14 to 70    Assessment & Plan:   Problem List Items Addressed This Visit   None   No follow-ups on file.    Nani Gasser, MD

## 2023-03-12 NOTE — Patient Instructions (Signed)
Please try to get your shingles vaccine done this summer.

## 2023-03-12 NOTE — Telephone Encounter (Signed)
Pt called.  She states her bp has been erratic lately, 96/64 and pulse is 81.

## 2023-03-13 ENCOUNTER — Telehealth: Payer: Self-pay | Admitting: Internal Medicine

## 2023-03-13 NOTE — Telephone Encounter (Signed)
Patient states she is concerned with heart rate 10:30  HR 101   3:00  85   3:14 101. Patient is non symptomatic. She states this is just the times she takes her BP and was the only reason she was aware her HR was increased.  She went to ED on 5/17. She saw primary care yesterday and they state no changes.   Patient taking metoprolol succinate 25 mg two times a day.   Cardioversion scheduled  6/5 Advised to continue with BP , be aware that she will have moments of A f-b, but they are not lasting and she has no symptoms so continue with log and medication as ordered.  If any further recommendations from provider, will let her know

## 2023-03-13 NOTE — Telephone Encounter (Signed)
Pt c/o BP issue: STAT if pt c/o blurred vision, one-sided weakness or slurred speech  1. What are your last 5 BP readings?   10:30 am  BP 97/63  HR 101 3:00 pm    BP 90/49  HR 85 3:30 pm    BP 117/66  HR 101  2. Are you having any other symptoms (ex. Dizziness, headache, blurred vision, passed out)?   No  3. What is your BP issue?   Patient is concerned her heart rate readings are high.

## 2023-03-21 ENCOUNTER — Telehealth: Payer: Self-pay | Admitting: Family Medicine

## 2023-03-21 NOTE — Telephone Encounter (Signed)
Pt called.  Pulse is still high although she increased her Eloquis to 2 times a day Pt also mentioned she has an appointment for cardio version on June 5.

## 2023-03-23 DIAGNOSIS — I480 Paroxysmal atrial fibrillation: Secondary | ICD-10-CM | POA: Diagnosis not present

## 2023-03-24 LAB — BASIC METABOLIC PANEL
BUN/Creatinine Ratio: 23 (ref 12–28)
BUN: 22 mg/dL (ref 8–27)
CO2: 24 mmol/L (ref 20–29)
Calcium: 10.1 mg/dL (ref 8.7–10.3)
Chloride: 96 mmol/L (ref 96–106)
Creatinine, Ser: 0.97 mg/dL (ref 0.57–1.00)
Glucose: 111 mg/dL — ABNORMAL HIGH (ref 70–99)
Potassium: 3.9 mmol/L (ref 3.5–5.2)
Sodium: 137 mmol/L (ref 134–144)
eGFR: 56 mL/min/{1.73_m2} — ABNORMAL LOW (ref >59)

## 2023-03-24 LAB — CBC
Hematocrit: 42.6 % (ref 34.0–46.6)
Hemoglobin: 14.3 g/dL (ref 11.1–15.9)
MCH: 29.5 pg (ref 26.6–33.0)
MCHC: 33.6 g/dL (ref 31.5–35.7)
MCV: 88 fL (ref 79–97)
Platelets: 378 10*3/uL (ref 150–450)
RBC: 4.84 x10E6/uL (ref 3.77–5.28)
RDW: 14 % (ref 11.7–15.4)
WBC: 9.3 10*3/uL (ref 3.4–10.8)

## 2023-03-24 LAB — TSH: TSH: 1.95 u[IU]/mL (ref 0.450–4.500)

## 2023-03-27 NOTE — Progress Notes (Signed)
Spoke with  patient, Procedure scheduled for 0830, Please arrive at the hospital at 0730, NPO after midnight on  Tuesday, May take meds with sips of water in the AM, please have transportation for home post procedure, and someone to stay with pt for approximately 24 hours after  Pt's questions answered, stated she has been taking Eliquis regularly and no missed doses

## 2023-03-28 ENCOUNTER — Encounter (HOSPITAL_COMMUNITY): Payer: Self-pay | Admitting: Cardiovascular Disease

## 2023-03-28 ENCOUNTER — Other Ambulatory Visit: Payer: Self-pay

## 2023-03-28 ENCOUNTER — Ambulatory Visit (HOSPITAL_COMMUNITY)
Admission: RE | Admit: 2023-03-28 | Discharge: 2023-03-28 | Disposition: A | Discharge status: Home / Self Care | Payer: Medicare HMO | Source: Ambulatory Visit | Attending: Cardiovascular Disease | Admitting: Cardiovascular Disease

## 2023-03-28 ENCOUNTER — Encounter (HOSPITAL_COMMUNITY)
Admission: RE | Disposition: A | Discharge status: Home / Self Care | Payer: Self-pay | Source: Ambulatory Visit | Attending: Cardiovascular Disease

## 2023-03-28 ENCOUNTER — Ambulatory Visit (HOSPITAL_BASED_OUTPATIENT_CLINIC_OR_DEPARTMENT_OTHER): Payer: Medicare HMO | Admitting: Certified Registered"

## 2023-03-28 ENCOUNTER — Ambulatory Visit (HOSPITAL_COMMUNITY): Payer: Medicare HMO | Admitting: Certified Registered"

## 2023-03-28 DIAGNOSIS — I480 Paroxysmal atrial fibrillation: Secondary | ICD-10-CM | POA: Insufficient documentation

## 2023-03-28 DIAGNOSIS — Z79899 Other long term (current) drug therapy: Secondary | ICD-10-CM | POA: Insufficient documentation

## 2023-03-28 DIAGNOSIS — F419 Anxiety disorder, unspecified: Secondary | ICD-10-CM

## 2023-03-28 DIAGNOSIS — Z87891 Personal history of nicotine dependence: Secondary | ICD-10-CM

## 2023-03-28 DIAGNOSIS — Z7901 Long term (current) use of anticoagulants: Secondary | ICD-10-CM | POA: Insufficient documentation

## 2023-03-28 DIAGNOSIS — I4891 Unspecified atrial fibrillation: Secondary | ICD-10-CM

## 2023-03-28 DIAGNOSIS — I10 Essential (primary) hypertension: Secondary | ICD-10-CM | POA: Diagnosis not present

## 2023-03-28 HISTORY — PX: CARDIOVERSION: SHX1299

## 2023-03-28 SURGERY — CARDIOVERSION
Anesthesia: General

## 2023-03-28 MED ORDER — PROPOFOL 10 MG/ML IV BOLUS
INTRAVENOUS | Status: DC | PRN
  Administered 2023-03-28: 80 mg via INTRAVENOUS

## 2023-03-28 MED ORDER — LIDOCAINE 2% (20 MG/ML) 5 ML SYRINGE
INTRAMUSCULAR | Status: DC | PRN
  Administered 2023-03-28: 60 mg via INTRAVENOUS

## 2023-03-28 MED ORDER — SODIUM CHLORIDE 0.9 % IV SOLN: INTRAVENOUS | Status: DC

## 2023-03-28 SURGICAL SUPPLY — 1 items: ELECT DEFIB PAD ADLT CADENCE (PAD) ×1 IMPLANT

## 2023-03-28 NOTE — CV Procedure (Signed)
Electrical Cardioversion Procedure Note Belinda Owens [id number] 1932-01-19  Procedure: Electrical Cardioversion Indications:  Atrial Fibrillation  Procedure Details Consent: Risks of procedure as well as the alternatives and risks of each were explained to the (patient/caregiver).  Consent for procedure obtained. Time Out: Verified patient identification, verified procedure, site/side was marked, verified correct patient position, special equipment/implants available, medications/allergies/relevent history reviewed, required imaging and test results available.  Performed  Patient placed on cardiac monitor, pulse oximetry, supplemental oxygen as necessary.  Sedation given:  propofol Pacer pads placed anterior and posterior chest.  Cardioverted 1 time(s).  Cardioverted at 150J.  Evaluation Findings: Post procedure EKG shows: NSR Complications: None Patient did tolerate procedure well.   Chilton Si, MD 03/28/2023, 8:28 AM

## 2023-03-28 NOTE — Anesthesia Preprocedure Evaluation (Signed)
Anesthesia Evaluation  Patient identified by MRN, date of birth, ID band Patient awake    Reviewed: Allergy & Precautions, H&P , NPO status , Patient's Chart, lab work & pertinent test results  Airway Mallampati: II  TM Distance: >3 FB Neck ROM: Full    Dental no notable dental hx.    Pulmonary neg pulmonary ROS, former smoker   Pulmonary exam normal breath sounds clear to auscultation       Cardiovascular hypertension, Pt. on medications Normal cardiovascular exam+ dysrhythmias Atrial Fibrillation  Rhythm:Regular Rate:Normal     Neuro/Psych  Headaches  Anxiety      negative psych ROS   GI/Hepatic Neg liver ROS,GERD  ,,  Endo/Other  negative endocrine ROS    Renal/GU Renal InsufficiencyRenal disease  negative genitourinary   Musculoskeletal  (+) Arthritis , Osteoarthritis,    Abdominal   Peds negative pediatric ROS (+)  Hematology negative hematology ROS (+)   Anesthesia Other Findings   Reproductive/Obstetrics negative OB ROS                             Anesthesia Physical Anesthesia Plan  ASA: 3  Anesthesia Plan: General   Post-op Pain Management: Minimal or no pain anticipated   Induction: Intravenous  PONV Risk Score and Plan: 3 and Ondansetron and Treatment may vary due to age or medical condition  Airway Management Planned: Mask  Additional Equipment:   Intra-op Plan:   Post-operative Plan:   Informed Consent: I have reviewed the patients History and Physical, chart, labs and discussed the procedure including the risks, benefits and alternatives for the proposed anesthesia with the patient or authorized representative who has indicated his/her understanding and acceptance.     Dental advisory given  Plan Discussed with: CRNA  Anesthesia Plan Comments:        Anesthesia Quick Evaluation

## 2023-03-28 NOTE — Anesthesia Postprocedure Evaluation (Signed)
Anesthesia Post Note  Patient: Belinda Owens  Procedure(s) Performed: CARDIOVERSION     Patient location during evaluation: PACU Anesthesia Type: General Level of consciousness: awake and alert Pain management: pain level controlled Vital Signs Assessment: post-procedure vital signs reviewed and stable Respiratory status: spontaneous breathing, nonlabored ventilation and respiratory function stable Cardiovascular status: blood pressure returned to baseline and stable Postop Assessment: no apparent nausea or vomiting Anesthetic complications: no   No notable events documented.  Last Vitals:  Vitals:   03/28/23 0737 03/28/23 0758  BP: 116/77 117/61  Pulse: 99 88  Resp:  15  Temp: 36.6 C   SpO2: 96% 96%    Last Pain:  Vitals:   03/28/23 0758  TempSrc:   PainSc: 0-No pain                 Lowella Curb

## 2023-03-28 NOTE — Transfer of Care (Signed)
Immediate Anesthesia Transfer of Care Note  Patient: Belinda Owens  Procedure(s) Performed: CARDIOVERSION  Patient Location: PACU and Cath Lab  Anesthesia Type:General  Level of Consciousness: awake and alert   Airway & Oxygen Therapy: Patient Spontanous Breathing and Patient connected to nasal cannula oxygen  Post-op Assessment: Report given to RN and Post -op Vital signs reviewed and stable  Post vital signs: Reviewed and stable  Last Vitals:  Vitals Value Taken Time  BP    Temp    Pulse    Resp    SpO2      Last Pain:  Vitals:   03/28/23 0758  TempSrc:   PainSc: 0-No pain         Complications: No notable events documented.

## 2023-03-28 NOTE — Interval H&P Note (Signed)
History and Physical Interval Note:  03/28/2023 8:16 AM  Belinda Owens  has presented today for surgery, with the diagnosis of AFIB.  The various methods of treatment have been discussed with the patient and family. After consideration of risks, benefits and other options for treatment, the patient has consented to  Procedure(s): CARDIOVERSION (N/A) as a surgical intervention.  The patient's history has been reviewed, patient examined, no change in status, stable for surgery.  I have reviewed the patient's chart and labs.  Questions were answered to the patient's satisfaction.     Chilton Si, MD

## 2023-03-29 ENCOUNTER — Encounter (HOSPITAL_COMMUNITY): Payer: Self-pay | Admitting: Cardiovascular Disease

## 2023-03-29 ENCOUNTER — Telehealth: Payer: Self-pay | Admitting: Family Medicine

## 2023-03-29 NOTE — Telephone Encounter (Signed)
Ok to take 2 if her BP and pulse are ok.

## 2023-03-29 NOTE — Telephone Encounter (Signed)
Patient called about her Metoprolol Succinate XL 25mg  she is confused about dosage

## 2023-03-29 NOTE — Telephone Encounter (Signed)
Patient has been updated of the following recommendation. No other inquiries during the call.

## 2023-03-29 NOTE — Telephone Encounter (Signed)
Belinda Owens states she was told on 03/12/23 appointment to take 2 tablets of the Metoprolol 25 mg once daily. I did not see any mention in the office note. She did have the cardioversion yesterday and it went well.

## 2023-03-30 NOTE — Telephone Encounter (Signed)
done

## 2023-04-03 ENCOUNTER — Telehealth: Payer: Self-pay

## 2023-04-03 NOTE — Telephone Encounter (Signed)
Belinda Owens's pulse 91 BPM. Patient advised of recommendations.

## 2023-04-03 NOTE — Telephone Encounter (Signed)
Belinda Owens called and states her blood pressure is 89/63. She takes metoprolol succinate 25 mg twice daily, amlodipine 5 mg once daily and Losartan-hydrochlorothiazide 100-25 mg once daily.

## 2023-04-03 NOTE — Telephone Encounter (Signed)
Hold the amlodipine and check blood pressure daily.  Do we have a pulse rate?

## 2023-04-06 ENCOUNTER — Ambulatory Visit (HOSPITAL_COMMUNITY): Payer: Medicare HMO | Attending: Internal Medicine

## 2023-04-06 ENCOUNTER — Other Ambulatory Visit: Payer: Self-pay | Admitting: Family Medicine

## 2023-04-06 DIAGNOSIS — I10 Essential (primary) hypertension: Secondary | ICD-10-CM

## 2023-04-06 DIAGNOSIS — E876 Hypokalemia: Secondary | ICD-10-CM

## 2023-04-06 DIAGNOSIS — I480 Paroxysmal atrial fibrillation: Secondary | ICD-10-CM | POA: Diagnosis not present

## 2023-04-06 LAB — ECHOCARDIOGRAM COMPLETE
Area-P 1/2: 3.61 cm2
S' Lateral: 2.3 cm

## 2023-04-09 ENCOUNTER — Ambulatory Visit (INDEPENDENT_AMBULATORY_CARE_PROVIDER_SITE_OTHER): Payer: Medicare HMO | Admitting: Physician Assistant

## 2023-04-09 DIAGNOSIS — Z0000 Encounter for general adult medical examination without abnormal findings: Secondary | ICD-10-CM | POA: Diagnosis not present

## 2023-04-09 DIAGNOSIS — Z780 Asymptomatic menopausal state: Secondary | ICD-10-CM

## 2023-04-09 NOTE — Progress Notes (Signed)
MEDICARE ANNUAL WELLNESS VISIT  04/09/2023  Telephone Visit Disclaimer This Medicare AWV was conducted by telephone due to national recommendations for restrictions regarding the COVID-19 Pandemic (e.g. social distancing).  I verified, using two identifiers, that I am speaking with Belinda Owens or their authorized healthcare agent. I discussed the limitations, risks, security, and privacy concerns of performing an evaluation and management service by telephone and the potential availability of an in-person appointment in the future. The patient expressed understanding and agreed to proceed.  Location of Patient: Home Location of Provider (nurse):  In the office.  Subjective:    Belinda Owens is a 89 y.o. female patient of Metheney, Barbarann Ehlers, MD who had a Medicare Annual Wellness Visit today via telephone. Belinda Owens is Retired and lives alone. she has 3 children. she reports that she is socially active and does interact with friends/family regularly. she is moderately physically active and enjoys playing scrabble.  Patient Care Team: Agapito Games, MD as PCP - General (Family Medicine) Wyline Mood Alben Spittle, MD as PCP - Cardiology (Cardiology) Gabriel Carina, [facility] as Pharmacist (Pharmacist)     04/09/2023   10:39 AM 04/03/2022    1:12 PM 01/19/2021   10:01 AM 07/29/2019    4:03 PM 03/13/2017   11:51 AM 01/04/2017   10:04 AM 08/19/2015    1:03 PM  Advanced Directives  Does Patient Have a Medical Advance Directive? No No No No No No No  Would patient like information on creating a medical advance directive? No - Patient declined No - Patient declined No - Patient declined No - Patient declined No - Patient declined Yes (ED - Information included in AVS) No - patient declined information    Hospital Utilization Over the Past 12 Months: # of hospitalizations or ER visits: 0 # of surgeries: 0  Review of Systems    Patient reports that her overall health is unchanged compared to  last year.  History obtained from chart review and the patient  Patient Reported Readings (BP, Pulse, CBG, Weight, etc) none  Pain Assessment Pain : No/denies pain     Current Medications & Allergies (verified) Allergies as of 04/09/2023       Reactions   Atorvastatin Other (See Comments)   Myalgia   Omeprazole Itching   Tylenol With Codeine #3  [acetaminophen-codeine] Nausea And Vomiting   Atrovent Nasal Spray [ipratropium] Other (See Comments)   burning   Sulfa Antibiotics Dermatitis        Medication List        Accurate as of April 09, 2023 10:49 AM. If you have any questions, ask your nurse or doctor.          acetaminophen 500 MG tablet Commonly known as: TYLENOL Take 1,000 mg by mouth every 6 (six) hours as needed (sinus headaches.).   amLODipine 5 MG tablet Commonly known as: NORVASC TAKE 1 TABLET BY MOUTH DAILY   apixaban 2.5 MG Tabs tablet Commonly known as: ELIQUIS Take 1 tablet (2.5 mg total) by mouth 2 (two) times daily.   cyanocobalamin 1000 MCG tablet Take 1,000 mcg by mouth daily.   fluticasone 50 MCG/ACT nasal spray Commonly known as: FLONASE Place 1-2 sprays into both nostrils daily. For nasal congestion What changed:  when to take this reasons to take this additional instructions   losartan-hydrochlorothiazide 100-25 MG tablet Commonly known as: HYZAAR TAKE 1 TABLET BY MOUTH DAILY   metoprolol succinate 25 MG 24 hr tablet Commonly known as:  TOPROL-XL TAKE 1 TABLET BY MOUTH DAILY What changed:  how much to take when to take this   multivitamin with minerals Tabs tablet Take 1 tablet by mouth in the morning. Centrum Silver for Women   pravastatin 20 MG tablet Commonly known as: PRAVACHOL TAKE ONE TABLET BY MOUTH EVERY NIGHT AT BEDTIME   sertraline 25 MG tablet Commonly known as: ZOLOFT TAKE 1 TABLET BY MOUTH DAILY   vitamin D3 25 MCG tablet Commonly known as: CHOLECALCIFEROL Take 1,000 Units by mouth in the morning.    zinc sulfate 220 (50 Zn) MG capsule Take 220 mg by mouth in the morning.   zolpidem 5 MG tablet Commonly known as: AMBIEN TAKE 1/2 TO 1 TABLET BY MOUTH EVERY NIGHT AT BEDTIME AS NEEDED FOR SLEEP        History (reviewed): Past Medical History:  Diagnosis Date   Fibrocystic breast changes of both breasts    Gets yearly mammo and [place]   History of rheumatic fever    Hypertension    Past Surgical History:  Procedure Laterality Date   BREAST BIOPSY     CARDIOVERSION N/A 03/28/2023   Procedure: CARDIOVERSION;  Surgeon: Chilton Si, MD;  Location: [facility] INVASIVE CV LAB;  Service: Cardiovascular;  Laterality: N/A;   MENISCUS REPAIR Right    RECONSTRUCTION OF EYELID Bilateral 12/14/2016   ROTATOR CUFF REPAIR Right    Family History  Problem Relation Age of Onset   Breast cancer Maternal Aunt    Colon cancer Father    Asthma Daughter    Heart disease Mother    Uterine cancer Mother    Cancer - Other Brother    Social History   Socioeconomic History   Marital status: Divorced    Spouse name: Not on file   Number of children: 3   Years of education: 13   Highest education level: Some college, no degree  Occupational History   Occupation: retired.     Comment: [employer]  Tobacco Use   Smoking status: Former    Types: Cigarettes    Quit date: 10/23/1957    Years since quitting: 65.5   Smokeless tobacco: Never  Vaping Use   Vaping Use: Never used  Substance and Sexual Activity   Alcohol use: Not Currently    Alcohol/week: 0.0 standard drinks of alcohol   Drug use: No   Sexual activity: Never    Partners: Male    Birth control/protection: None  Other Topics Concern   Not on file  Social History Narrative   Lives alone. Had three children, one has deceased. She enjoys playing scrabble.   Social Determinants of Health   Financial Resource Strain: Low Risk  (04/09/2023)   Overall Financial Resource Strain (CARDIA)    Difficulty of Paying Living Expenses: Not  hard at all  Food Insecurity: No Food Insecurity (04/09/2023)   Hunger Vital Sign    Worried About Running Out of Food in the Last Year: Never true    Ran Out of Food in the Last Year: Never true  Transportation Needs: No Transportation Needs (04/09/2023)   PRAPARE - Administrator, Civil Service (Medical): No    Lack of Transportation (Non-Medical): No  Physical Activity: Inactive (04/09/2023)   Exercise Vital Sign    Days of Exercise per Week: 0 days    Minutes of Exercise per Session: 0 min  Stress: No Stress Concern Present (04/09/2023)   [employer] of Occupational Health - Occupational Stress Questionnaire  Feeling of Stress : Not at all  Social Connections: Moderately Integrated (04/09/2023)   Social Connection and Isolation Panel [NHANES]    Frequency of Communication with Friends and Family: More than three times a week    Frequency of Social Gatherings with Friends and Family: Once a week    Attends Religious Services: More than 4 times per year    Active Member of [employer] or Organizations: Yes    Attends Banker Meetings: More than 4 times per year    Marital Status: Divorced    Activities of Daily Living    04/09/2023   10:43 AM 03/28/2023    7:57 AM  In your present state of health, do you have any difficulty performing the following activities:  Hearing? 0 0  Vision? 0 0  Difficulty concentrating or making decisions? 0 0  Walking or climbing stairs? 0 0  Dressing or bathing? 0 0  Doing errands, shopping? 0   Preparing Food and eating ? N   Using the Toilet? N   In the past six months, have you accidently leaked urine? N   Do you have problems with loss of bowel control? N   Managing your Medications? N   Managing your Finances? N   Housekeeping or managing your Housekeeping? N     Patient Education/ Literacy How often do you need to have someone help you when you read instructions, pamphlets, or other written materials from your  doctor or pharmacy?: 1 - Never What is the last grade level you completed in school?: 12+1 year of college  Exercise Current Exercise Habits: Home exercise routine, Type of exercise: walking, Time (Minutes): 10, Frequency (Times/Week): 7, Weekly Exercise (Minutes/Week): 70, Intensity: Mild, Exercise limited by: None identified  Diet Patient reports consuming 2 meals a day and 3 snack(s) a day Patient reports that her primary diet is: Regular Patient reports that she does have regular access to food.   Depression Screen    04/09/2023   10:39 AM 04/03/2022    1:13 PM 02/20/2022    3:24 PM 12/09/2021    2:35 PM 01/19/2021   10:01 AM 01/10/2021   11:21 AM 07/13/2020   10:50 AM  PHQ 2/9 Scores  PHQ - 2 Score 0 0 0 0 0 0 0  PHQ- 9 Score       0     Fall Risk    04/09/2023   10:39 AM 03/05/2023    1:07 PM 04/03/2022    1:13 PM 04/02/2022    1:46 PM 02/20/2022    3:24 PM  Fall Risk   Falls in the past year? 1 0 1 1 0  Number falls in past yr: 0 0 0 0 0  Injury with Fall? 0 0 0 0 0  Risk for fall due to : History of fall(s) No Fall Risks No Fall Risks  No Fall Risks  Follow up Falls evaluation completed Falls evaluation completed Falls evaluation completed  Falls evaluation completed     Objective:  Belinda Owens seemed alert and oriented and she participated appropriately during our telephone visit.  Blood Pressure Weight BMI  BP Readings from Last 3 Encounters:  03/28/23 118/71  03/12/23 100/64  03/09/23 100/69   Wt Readings from Last 3 Encounters:  03/28/23 162 lb (73.5 kg)  03/12/23 158 lb (71.7 kg)  03/06/23 162 lb 12.8 oz (73.8 kg)   BMI Readings from Last 1 Encounters:  03/28/23 28.70 kg/m    *Unable  to obtain current vital signs, weight, and BMI due to telephone visit type  Hearing/Vision  Belinda Owens did not seem to have difficulty with hearing/understanding during the telephone conversation Reports that she has not had a formal eye exam by an eye care professional within  the past year Reports that she has not had a formal hearing evaluation within the past year *Unable to fully assess hearing and vision during telephone visit type  Cognitive Function:    04/09/2023   10:44 AM 04/03/2022    1:17 PM 01/19/2021   10:05 AM 07/29/2019    4:06 PM  6CIT Screen  What Year? 0 points 0 points 0 points 0 points  What month? 0 points 0 points 0 points 0 points  What time? 0 points 0 points 0 points 0 points  Count back from 20 0 points 0 points 0 points 0 points  Months in reverse 0 points 0 points 0 points 0 points  Repeat phrase 0 points 0 points 0 points 0 points  Total Score 0 points 0 points 0 points 0 points   (Normal:0-7, Significant for Dysfunction: >8)  Normal Cognitive Function Screening: Yes   Immunization & Health Maintenance Record Immunization History  Administered Date(s) Administered   Fluad Quad(high Dose 65+) 07/15/2019, 09/19/2021, 09/11/2022   Influenza, High Dose Seasonal PF 07/16/2017, 07/17/2018   Influenza,inj,Quad PF,6+ Mos 08/05/2015, 07/06/2016   Influenza-Unspecified 07/15/2020   Moderna Sars-Covid-2 Vaccination 01/01/2020, 02/03/2020   PFIZER(Purple Top)SARS-COV-2 Vaccination 07/15/2020   PNEUMOCOCCAL CONJUGATE-20 03/10/2022   Pneumococcal Conjugate-13 08/19/2015   Pneumococcal Polysaccharide-23 01/04/2017   Tdap 03/23/2014    Health Maintenance  Topic Date Due   Zoster Vaccines- Shingrix (1 of 2) 06/05/2023 (Originally 10/13/1951)   COVID-19 Vaccine (4 - 2023-24 season) 09/12/2023 (Originally 06/23/2022)   INFLUENZA VACCINE  05/24/2023   DTaP/Tdap/Td (2 - Td or Tdap) 03/23/2024   Medicare Annual Wellness (AWV)  04/08/2024   Pneumonia Vaccine 6+ Years old  Completed   DEXA SCAN  Completed   HPV VACCINES  Aged Out       Assessment  This is a routine wellness examination for Belinda Owens.  Health Maintenance: Due or Overdue There are no preventive care reminders to display for this patient.   Belinda Owens does  not need a referral for Belinda Owens: Care Management:   no Social Work:    no Prescription Owens:  no Nutrition/Diabetes Education:  no   Plan:  Personalized Goals  Goals Addressed               This Visit's Progress     Patient Stated (pt-stated)        Patient would like to work on her BP and make sure it stays in check.       Personalized Health Maintenance & Screening Recommendations  Bone densitometry screening Shingles vaccine  Lung Cancer Screening Recommended: no (Low Dose CT Chest recommended if Age 20-80 years, 20 pack-year currently smoking OR have quit w/in past 15 years) Hepatitis C Screening recommended: no HIV Screening recommended: no  Advanced Directives: Written information was not prepared per patient's request.  Referrals & Orders Orders Placed This Encounter  Procedures   DEXAScan    Follow-up Plan Follow-up with Agapito Games, MD as planned Schedule shingles vaccine at the pharmacy.  Medicare wellness visit in one year.  AVS printed and mailed to the patient.    I have personally reviewed and noted the following in the patient's chart:   Medical  and social history Use of alcohol, tobacco or illicit drugs  Current medications and supplements Functional ability and status Nutritional status Physical activity Advanced directives List of other physicians Hospitalizations, surgeries, and ER visits in previous 12 months Vitals Screenings to include cognitive, depression, and falls Referrals and appointments  In addition, I have reviewed and discussed with Belinda Laroche Jasmin certain preventive protocols, quality metrics, and best practice recommendations. A written personalized care plan for preventive services as well as general preventive health recommendations is available and can be mailed to the patient at her request.      Belinda Charon, RN BSN  04/09/2023

## 2023-04-09 NOTE — Patient Instructions (Addendum)
MEDICARE ANNUAL WELLNESS VISIT Health Maintenance Summary and Written Plan of Care  Belinda Owens ,  Thank you for allowing me to perform your Medicare Annual Wellness Visit and for your ongoing commitment to your health.   Health Maintenance & Immunization History Health Maintenance  Topic Date Due   Zoster Vaccines- Shingrix (1 of 2) 06/05/2023 (Originally 10/13/1951)   COVID-19 Vaccine (4 - 2023-24 season) 09/12/2023 (Originally 06/23/2022)   INFLUENZA VACCINE  05/24/2023   DTaP/Tdap/Td (2 - Td or Tdap) 03/23/2024   Medicare Annual Wellness (AWV)  04/08/2024   Pneumonia Vaccine 26+ Years old  Completed   DEXA SCAN  Completed   HPV VACCINES  Aged Out   Immunization History  Administered Date(s) Administered   Fluad Quad(high Dose 65+) 07/15/2019, 09/19/2021, 09/11/2022   Influenza, High Dose Seasonal PF 07/16/2017, 07/17/2018   Influenza,inj,Quad PF,6+ Mos 08/05/2015, 07/06/2016   Influenza-Unspecified 07/15/2020   Moderna Sars-Covid-2 Vaccination 01/01/2020, 02/03/2020   PFIZER(Purple Top)SARS-COV-2 Vaccination 07/15/2020   PNEUMOCOCCAL CONJUGATE-20 03/10/2022   Pneumococcal Conjugate-13 08/19/2015   Pneumococcal Polysaccharide-23 01/04/2017   Tdap 03/23/2014    These are the patient goals that we discussed:  Goals Addressed               This Visit's Progress     Patient Stated (pt-stated)        Patient would like to work on her BP and make sure it stays in check.         This is a list of Health Maintenance Items that are overdue or due now: Bone densitometry screening Shingles vaccine    Orders/Referrals Placed Today: Orders Placed This Encounter  Procedures   DEXAScan    Standing Status:   Future    Standing Expiration Date:   04/08/2024    Scheduling Instructions:     Please call patient to schedule.    Order Specific Question:   Reason for exam:    Answer:   post menopausal    Order Specific Question:   Preferred imaging location?    Answer:    [facility]   (Contact our referral department at [phone] if you have not spoken with someone about your referral appointment within the next 5 days)    Follow-up Plan Follow-up with Agapito Games, MD as planned Schedule shingles vaccine at the pharmacy.  Medicare wellness visit in one year.  AVS printed and mailed to the patient.       Health Maintenance, Female Adopting a healthy lifestyle and getting preventive care are important in promoting health and wellness. Ask your health care provider about: The right schedule for you to have regular tests and exams. Things you can do on your own to prevent diseases and keep yourself healthy. What should I know about diet, weight, and exercise? Eat a healthy diet  Eat a diet that includes plenty of vegetables, fruits, low-fat dairy products, and lean protein. Do not eat a lot of foods that are high in solid fats, added sugars, or sodium. Maintain a healthy weight Body mass index (BMI) is used to identify weight problems. It estimates body fat based on height and weight. Your health care provider can help determine your BMI and help you achieve or maintain a healthy weight. Get regular exercise Get regular exercise. This is one of the most important things you can do for your health. Most adults should: Exercise for at least 150 minutes each week. The exercise should increase your heart rate and make you sweat (  moderate-intensity exercise). Do strengthening exercises at least twice a week. This is in addition to the moderate-intensity exercise. Spend less time sitting. Even light physical activity can be beneficial. Watch cholesterol and blood lipids Have your blood tested for lipids and cholesterol at 87 years of age, then have this test every 5 years. Have your cholesterol levels checked more often if: Your lipid or cholesterol levels are high. You are older than 87 years of age. You are at high risk for heart  disease. What should I know about cancer screening? Depending on your health history and family history, you may need to have cancer screening at various ages. This may include screening for: Breast cancer. Cervical cancer. Colorectal cancer. Skin cancer. Lung cancer. What should I know about heart disease, diabetes, and high blood pressure? Blood pressure and heart disease High blood pressure causes heart disease and increases the risk of stroke. This is more likely to develop in people who have high blood pressure readings or are overweight. Have your blood pressure checked: Every 3-5 years if you are 19-45 years of age. Every year if you are 79 years old or older. Diabetes Have regular diabetes screenings. This checks your fasting blood sugar level. Have the screening done: Once every three years after age 59 if you are at a normal weight and have a low risk for diabetes. More often and at a younger age if you are overweight or have a high risk for diabetes. What should I know about preventing infection? Hepatitis B If you have a higher risk for hepatitis B, you should be screened for this virus. Talk with your health care provider to find out if you are at risk for hepatitis B infection. Hepatitis C Testing is recommended for: Everyone born from [birth year] through 1963. Anyone with known risk factors for hepatitis C. Sexually transmitted infections (STIs) Get screened for STIs, including gonorrhea and chlamydia, if: You are sexually active and are younger than 87 years of age. You are older than 87 years of age and your health care provider tells you that you are at risk for this type of infection. Your sexual activity has changed since you were last screened, and you are at increased risk for chlamydia or gonorrhea. Ask your health care provider if you are at risk. Ask your health care provider about whether you are at high risk for HIV. Your health care provider may recommend a  prescription medicine to help prevent HIV infection. If you choose to take medicine to prevent HIV, you should first get tested for HIV. You should then be tested every 3 months for as long as you are taking the medicine. Pregnancy If you are about to stop having your period (premenopausal) and you may become pregnant, seek counseling before you get pregnant. Take 400 to 800 micrograms (mcg) of folic acid every day if you become pregnant. Ask for birth control (contraception) if you want to prevent pregnancy. Osteoporosis and menopause Osteoporosis is a disease in which the bones lose minerals and strength with aging. This can result in bone fractures. If you are 33 years old or older, or if you are at risk for osteoporosis and fractures, ask your health care provider if you should: Be screened for bone loss. Take a calcium or vitamin D supplement to lower your risk of fractures. Be given hormone replacement therapy (HRT) to treat symptoms of menopause. Follow these instructions at home: Alcohol use Do not drink alcohol if: Your health care provider tells  you not to drink. You are pregnant, may be pregnant, or are planning to become pregnant. If you drink alcohol: Limit how much you have to: 0-1 drink a day. Know how much alcohol is in your drink. In the [place] one drink equals one 12 oz bottle of beer (355 mL), one 5 oz glass of wine (148 mL), or one 1 oz glass of hard liquor (44 mL). Lifestyle Do not use any products that contain nicotine or tobacco. These products include cigarettes, chewing tobacco, and vaping devices, such as e-cigarettes. If you need help quitting, ask your health care provider. Do not use street drugs. Do not share needles. Ask your health care provider for help if you need support or information about quitting drugs. General instructions Schedule regular health, dental, and eye exams. Stay current with your vaccines. Tell your health care provider if: You often  feel depressed. You have ever been abused or do not feel safe at home. Summary Adopting a healthy lifestyle and getting preventive care are important in promoting health and wellness. Follow your health care provider's instructions about healthy diet, exercising, and getting tested or screened for diseases. Follow your health care provider's instructions on monitoring your cholesterol and blood pressure. This information is not intended to replace advice given to you by your health care provider. Make sure you discuss any questions you have with your health care provider. Document Revised: 02/28/2021 Document Reviewed: 02/28/2021 Elsevier Patient Education  2024 [employer].

## 2023-04-11 ENCOUNTER — Other Ambulatory Visit: Payer: Self-pay | Admitting: Family Medicine

## 2023-04-11 DIAGNOSIS — I10 Essential (primary) hypertension: Secondary | ICD-10-CM

## 2023-05-08 ENCOUNTER — Other Ambulatory Visit: Payer: Self-pay | Admitting: Family Medicine

## 2023-05-08 DIAGNOSIS — E876 Hypokalemia: Secondary | ICD-10-CM

## 2023-05-08 DIAGNOSIS — I10 Essential (primary) hypertension: Secondary | ICD-10-CM

## 2023-05-09 ENCOUNTER — Other Ambulatory Visit: Payer: Medicare HMO

## 2023-05-23 ENCOUNTER — Other Ambulatory Visit: Payer: Medicare HMO

## 2023-05-23 ENCOUNTER — Other Ambulatory Visit: Payer: Self-pay | Admitting: Family Medicine

## 2023-05-23 DIAGNOSIS — I10 Essential (primary) hypertension: Secondary | ICD-10-CM

## 2023-05-23 DIAGNOSIS — E876 Hypokalemia: Secondary | ICD-10-CM

## 2023-05-25 ENCOUNTER — Ambulatory Visit: Payer: Medicare HMO | Admitting: Family Medicine

## 2023-05-29 ENCOUNTER — Other Ambulatory Visit: Payer: Self-pay | Admitting: Family Medicine

## 2023-05-29 DIAGNOSIS — I4891 Unspecified atrial fibrillation: Secondary | ICD-10-CM

## 2023-06-01 ENCOUNTER — Other Ambulatory Visit: Payer: Self-pay | Admitting: Family Medicine

## 2023-06-01 ENCOUNTER — Ambulatory Visit: Payer: Medicare HMO | Admitting: Sports Medicine

## 2023-06-01 DIAGNOSIS — E876 Hypokalemia: Secondary | ICD-10-CM

## 2023-06-01 DIAGNOSIS — I10 Essential (primary) hypertension: Secondary | ICD-10-CM

## 2023-06-01 NOTE — Telephone Encounter (Signed)
Called and advised pt that it is too soon for refill.

## 2023-06-01 NOTE — Telephone Encounter (Signed)
Sent to pcp for signature

## 2023-06-01 NOTE — Telephone Encounter (Signed)
Pt called.  She needs a new rx for her Zolpidem.

## 2023-06-01 NOTE — Telephone Encounter (Signed)
Per the records she already has a refill on file from the prescription we sent last month.  Please call the pharmacy and have them get ready the refill.

## 2023-06-06 ENCOUNTER — Telehealth: Payer: Self-pay

## 2023-06-06 NOTE — Telephone Encounter (Signed)
If she thinks she could be back in A-fib again were happy to see her in an acute visit tomorrow and do an EKG and find out.

## 2023-06-06 NOTE — Telephone Encounter (Signed)
Patient has appt tomorrow with Dr. Benjamin Stain. Per Dr. Linford Arnold he can listen to her heart to see if rhythym is normal or if he thinks she may be in A-fib at the appt.

## 2023-06-06 NOTE — Telephone Encounter (Signed)
Belinda Owens states her pulse rate in 96 BPM. She denies irregular heart rate, shortness of breath, chest pains, headache or dizziness. She states she feels fine.  She doesn't know what her pulse is on a daily. She just started checking her pulse rate over the weekend. I did advise that the 96 BPM is in normal range.    She did have a blood vessel burst in her eye.

## 2023-06-07 ENCOUNTER — Encounter: Payer: Self-pay | Admitting: Sports Medicine

## 2023-06-07 ENCOUNTER — Ambulatory Visit (INDEPENDENT_AMBULATORY_CARE_PROVIDER_SITE_OTHER): Payer: Medicare HMO | Admitting: Sports Medicine

## 2023-06-07 ENCOUNTER — Ambulatory Visit (INDEPENDENT_AMBULATORY_CARE_PROVIDER_SITE_OTHER): Payer: Medicare HMO

## 2023-06-07 VITALS — BP 131/91 | HR 93 | Ht 63.0 in | Wt 158.0 lb

## 2023-06-07 DIAGNOSIS — M7062 Trochanteric bursitis, left hip: Secondary | ICD-10-CM | POA: Diagnosis not present

## 2023-06-07 DIAGNOSIS — I10 Essential (primary) hypertension: Secondary | ICD-10-CM

## 2023-06-07 DIAGNOSIS — I4891 Unspecified atrial fibrillation: Secondary | ICD-10-CM

## 2023-06-07 DIAGNOSIS — M160 Bilateral primary osteoarthritis of hip: Secondary | ICD-10-CM | POA: Diagnosis not present

## 2023-06-07 DIAGNOSIS — H1131 Conjunctival hemorrhage, right eye: Secondary | ICD-10-CM | POA: Diagnosis not present

## 2023-06-07 DIAGNOSIS — M25552 Pain in left hip: Secondary | ICD-10-CM | POA: Diagnosis not present

## 2023-06-07 MED ORDER — METOPROLOL SUCCINATE ER 50 MG PO TB24: 50.0000 mg | ORAL_TABLET | Freq: Every day | ORAL | 11 refills | Status: DC

## 2023-06-07 NOTE — Assessment & Plan Note (Signed)
No identifiable trauma, asymptomatic, patient was reassured.

## 2023-06-07 NOTE — Assessment & Plan Note (Signed)
Belinda Owens is a pleasant 89 year old female, she is having recurrence of pain left hip lateral aspect over the greater trochanter. On exam she has tenderness directly over the greater trochanter. Only a bit of pain with hip internal rotation but this refers to the lateral hip as well. I last treated this about 6 years ago and she did well, we will start conservative treatment again, x-rays, home conditioning, after 4 weeks if not better we will consider an injection.

## 2023-06-07 NOTE — Assessment & Plan Note (Signed)
Recent cardioversion, she has noted to heart rates typically 90-100 at rest at home. She was brought in today to make sure that she is not in A-fib, her heart rhythm is regular, rate is about 100 during my exam. She is not in A-fib, blood pressure a touch high, we will go ahead and increase her Toprol-XL to 50 mg daily with a 2-week follow-up with Dr. Linford Arnold.

## 2023-06-07 NOTE — Progress Notes (Signed)
    Procedures performed today:    None.  Independent interpretation of notes and tests performed by another provider:   None.  Brief History, Exam, Impression, and Recommendations:    Greater trochanteric bursitis, left Belinda Owens is a pleasant 89 year old female, she is having recurrence of pain left hip lateral aspect over the greater trochanter. On exam she has tenderness directly over the greater trochanter. Only a bit of pain with hip internal rotation but this refers to the lateral hip as well. I last treated this about 6 years ago and she did well, we will start conservative treatment again, x-rays, home conditioning, after 4 weeks if not better we will consider an injection.  Subconjunctival hemorrhage, right No identifiable trauma, asymptomatic, patient was reassured.  Atrial fibrillation (HCC) Recent cardioversion, she has noted to heart rates typically 90-100 at rest at home. She was brought in today to make sure that she is not in A-fib, her heart rhythm is regular, rate is about 100 during my exam. She is not in A-fib, blood pressure a touch high, we will go ahead and increase her Toprol-XL to 50 mg daily with a 2-week follow-up with Dr. Linford Arnold.    ____________________________________________ Ihor Austin. Benjamin Stain, M.D., ABFM., CAQSM., AME. Primary Care and Sports Medicine [facility]  Adjunct Professor of Family Medicine  [place] [employer] [facility] [employer]  Restaurant manager, fast food

## 2023-06-07 NOTE — Telephone Encounter (Addendum)
Not a problem at all, I can get an ECG if it is irregular, please just have Fara remind me.

## 2023-06-13 ENCOUNTER — Other Ambulatory Visit: Payer: Medicare HMO

## 2023-06-21 ENCOUNTER — Encounter: Payer: Self-pay | Admitting: Family Medicine

## 2023-06-21 ENCOUNTER — Ambulatory Visit (INDEPENDENT_AMBULATORY_CARE_PROVIDER_SITE_OTHER): Payer: Medicare HMO | Admitting: Family Medicine

## 2023-06-21 VITALS — BP 115/76 | HR 101 | Ht 64.0 in | Wt 158.0 lb

## 2023-06-21 DIAGNOSIS — I10 Essential (primary) hypertension: Secondary | ICD-10-CM

## 2023-06-21 DIAGNOSIS — N898 Other specified noninflammatory disorders of vagina: Secondary | ICD-10-CM | POA: Diagnosis not present

## 2023-06-21 DIAGNOSIS — R3129 Other microscopic hematuria: Secondary | ICD-10-CM

## 2023-06-21 DIAGNOSIS — Z23 Encounter for immunization: Secondary | ICD-10-CM

## 2023-06-21 LAB — POCT URINALYSIS DIP (CLINITEK)
Bilirubin, UA: NEGATIVE
Glucose, UA: NEGATIVE mg/dL
Ketones, POC UA: NEGATIVE mg/dL
Nitrite, UA: NEGATIVE
POC PROTEIN,UA: NEGATIVE
Spec Grav, UA: 1.02 (ref 1.010–1.025)
Urobilinogen, UA: 0.2 E.U./dL
pH, UA: 6 (ref 5.0–8.0)

## 2023-06-21 MED ORDER — LOSARTAN POTASSIUM-HCTZ 100-12.5 MG PO TABS
1.0000 | ORAL_TABLET | Freq: Every day | ORAL | 3 refills | Status: DC
Start: 2023-06-21 — End: 2024-06-10

## 2023-06-21 NOTE — Patient Instructions (Signed)
Please stop the lisinopril 100/25 and start the 100/12.5

## 2023-06-21 NOTE — Progress Notes (Signed)
Established Patient Office Visit  Subjective   Patient ID: Belinda Owens, female    DOB: 1931-11-01  Age: 89 y.o. MRN: [id number]  Chief Complaint  Patient presents with   Follow-up         HPI  Saw DR. T for trochanteric bursitis about 2 weeks ago. They was also worried at that time that she was back in Afib. Cardioversion done in June. BP was high 2 weeks ago so Toprol was inc to 50mg  XL.    Occ her pulse still goes high and gets SOB with even the increased dose of metoprolol but she does feel like it has been helpful.  So notes that when she wiped at the end of urination yesterday she noticed a little pink tinge on the toilet paper she was not sure if it was vaginal or periurethral or from the urine itself but she did not notice any blood directly in the urine she has not noticed it since then.    ROS    Objective:     BP 115/76   Pulse (!) 101   Ht 5\' 4"  (1.626 m)   Wt 158 lb (71.7 kg)   SpO2 96%   BMI 27.12 kg/m    Physical Exam Vitals and nursing note reviewed.  Constitutional:      Appearance: Normal appearance.  HENT:     Head: Normocephalic and atraumatic.  Eyes:     Conjunctiva/sclera: Conjunctivae normal.  Cardiovascular:     Rate and Rhythm: Normal rate. Rhythm irregular.  Pulmonary:     Effort: Pulmonary effort is normal.     Breath sounds: Normal breath sounds.  Genitourinary:      Comments: Please see picture above she had an area of abrasion the location of the blue circle.  No active bleeding but I suspect that this was the most likely culprit.  No other areas of irritation or skin break etc.  We did go ahead and do a vaginal swab just to rule out any type of yeast or BV that could be causing some irritation as well will Skin:    General: Skin is warm and dry.  Neurological:     Mental Status: She is alert.  Psychiatric:        Mood and Affect: Mood normal.      Results for orders placed or performed in visit on 06/21/23  WET PREP  FOR TRICH, YEAST, CLUE  Result Value Ref Range   Trichomonas Exam WILL FOLLOW    Yeast Exam WILL FOLLOW    Clue Cell Exam WILL FOLLOW   Urine Microscopic  Result Value Ref Range   WBC, UA 0-5 0 - 5 /hpf   RBC, Urine 0-2 0 - 2 /hpf   Epithelial Cells (non renal) 0-10 0 - 10 /hpf   Casts None seen None seen /lpf   Bacteria, UA None seen None seen/Few  Specimen status report  Result Value Ref Range   specimen status report Comment   POCT URINALYSIS DIP (CLINITEK)  Result Value Ref Range   Color, UA yellow yellow   Clarity, UA clear clear   Glucose, UA negative negative mg/dL   Bilirubin, UA negative negative   Ketones, POC UA negative negative mg/dL   Spec Grav, UA 7.846 9.629 - 1.025   Blood, UA small (A) negative   pH, UA 6.0 5.0 - 8.0   POC PROTEIN,UA negative negative, trace   Urobilinogen, UA 0.2 0.2 or 1.0 E.U./dL  Nitrite, UA Negative Negative   Leukocytes, UA Small (1+) (A) Negative      The ASCVD Risk score (Arnett DK, et al., 2019) failed to calculate for the following reasons:   The 2019 ASCVD risk score is only valid for ages 23 to 31    Assessment & Plan:   Problem List Items Addressed This Visit       Cardiovascular and Mediastinum   Essential hypertension - Primary    Since her blood pressure looks fantastic and is even a little bit on the lower side and she is over 75 and going to actually decrease the diuretic component of her losartan HCT since we did increase her metoprolol.      Relevant Medications   losartan-hydrochlorothiazide (HYZAAR) 100-12.5 MG tablet   Other Visit Diagnoses     Encounter for immunization       Relevant Orders   Flu Vaccine Trivalent High Dose (Fluad) (Completed)   Hematuria, microscopic       Relevant Orders   POCT URINALYSIS DIP (CLINITEK) (Completed)   WET PREP FOR TRICH, YEAST, CLUE (Completed)   Urinalysis, microscopic only   Urine Microscopic (Completed)   Specimen status report (Completed)   Vaginal itching        Relevant Orders   WET PREP FOR TRICH, YEAST, CLUE (Completed)      Urinalysis today did show just some all amount of blood on the dipstick so we will send for microscopic review.  Abrasion -  most likely source of blood that she saw yesterday.  Will send wet prep for further evaluation as well.  No follow-ups on file.    Nani Gasser, MD

## 2023-06-22 LAB — URINALYSIS, MICROSCOPIC ONLY
Bacteria, UA: NONE SEEN
Casts: NONE SEEN /LPF

## 2023-06-22 LAB — WET PREP FOR TRICH, YEAST, CLUE
Clue Cell Exam: NEGATIVE
Trichomonas Exam: NEGATIVE
Yeast Exam: NEGATIVE

## 2023-06-22 LAB — SPECIMEN STATUS REPORT

## 2023-06-22 NOTE — Progress Notes (Signed)
Call patient: Urine sample looks okay.  Vaginal swab still pending.

## 2023-06-22 NOTE — Assessment & Plan Note (Signed)
Since her blood pressure looks fantastic and is even a little bit on the lower side and she is over 75 and going to actually decrease the diuretic component of her losartan HCT since we did increase her metoprolol.

## 2023-06-26 NOTE — Progress Notes (Signed)
General Swab is negative for any yeast or overgrowth of bacteria.  Right now I just think the little bit of blood was probably from that small abrasion.

## 2023-07-04 ENCOUNTER — Other Ambulatory Visit: Payer: Self-pay | Admitting: Family Medicine

## 2023-07-04 DIAGNOSIS — I10 Essential (primary) hypertension: Secondary | ICD-10-CM

## 2023-07-05 ENCOUNTER — Ambulatory Visit: Payer: Medicare HMO | Admitting: Sports Medicine

## 2023-07-07 ENCOUNTER — Other Ambulatory Visit: Payer: Self-pay | Admitting: Family Medicine

## 2023-07-07 DIAGNOSIS — I10 Essential (primary) hypertension: Secondary | ICD-10-CM

## 2023-07-09 ENCOUNTER — Other Ambulatory Visit: Payer: Self-pay | Admitting: Family Medicine

## 2023-07-09 DIAGNOSIS — I10 Essential (primary) hypertension: Secondary | ICD-10-CM

## 2023-07-09 DIAGNOSIS — E876 Hypokalemia: Secondary | ICD-10-CM

## 2023-07-16 ENCOUNTER — Encounter: Payer: Self-pay | Admitting: Sports Medicine

## 2023-07-16 ENCOUNTER — Ambulatory Visit (INDEPENDENT_AMBULATORY_CARE_PROVIDER_SITE_OTHER): Payer: Medicare HMO | Admitting: Sports Medicine

## 2023-07-16 ENCOUNTER — Other Ambulatory Visit: Payer: Medicare HMO

## 2023-07-16 DIAGNOSIS — M7062 Trochanteric bursitis, left hip: Secondary | ICD-10-CM

## 2023-07-16 DIAGNOSIS — M65311 Trigger thumb, right thumb: Secondary | ICD-10-CM | POA: Diagnosis not present

## 2023-07-16 MED ORDER — TRIAMCINOLONE ACETONIDE 40 MG/ML IJ SUSP
40.0000 mg | Freq: Once | INTRAMUSCULAR | Status: AC
Start: 2023-07-16 — End: 2023-07-16
  Administered 2023-07-16: 40 mg via INTRAMUSCULAR

## 2023-07-16 NOTE — Assessment & Plan Note (Signed)
Resolved with home PT

## 2023-07-16 NOTE — Progress Notes (Signed)
    Procedures performed today:    Procedure: Real-time Ultrasound Guided injection of the right flexor pollicis longus tendon sheath Device: Samsung HS60  Verbal informed consent obtained.  Time-out conducted.  Noted no overlying erythema, induration, or other signs of local infection.  Skin prepped in a sterile fashion.  Local anesthesia: Topical Ethyl chloride.  With sterile technique and under real time ultrasound guidance: Noted flexor tendon nodule, 1/2 cc lidocaine, 1/2 cc kenalog 40 injected easily. Completed without difficulty  Advised to call if fevers/chills, erythema, induration, drainage, or persistent bleeding.  Images permanently stored and available for review in PACS.  Impression: Technically successful ultrasound guided injection.  Independent interpretation of notes and tests performed by another provider:   None.  Brief History, Exam, Impression, and Recommendations:    Trigger thumb, right thumb Recurrence of right thumb triggering with pain, last injection was in April of this year, repeat right flexor pollicis longus tendon sheath injection.  Greater trochanteric bursitis, left Resolved with home PT    ____________________________________________ Ihor Austin. Benjamin Stain, M.D., ABFM., CAQSM., AME. Primary Care and Sports Medicine  [facility]  Adjunct Professor of Family Medicine  [employer]  Restaurant manager, fast food

## 2023-07-16 NOTE — Addendum Note (Signed)
Addended by: Carren Rang A on: 07/16/2023 04:42 PM   Modules accepted: Orders

## 2023-07-16 NOTE — Assessment & Plan Note (Signed)
Recurrence of right thumb triggering with pain, last injection was in April of this year, repeat right flexor pollicis longus tendon sheath injection.

## 2023-08-01 ENCOUNTER — Other Ambulatory Visit: Payer: Self-pay | Admitting: Family Medicine

## 2023-08-01 DIAGNOSIS — E785 Hyperlipidemia, unspecified: Secondary | ICD-10-CM

## 2023-08-13 ENCOUNTER — Ambulatory Visit (INDEPENDENT_AMBULATORY_CARE_PROVIDER_SITE_OTHER): Payer: Medicare HMO | Admitting: Family Medicine

## 2023-08-13 ENCOUNTER — Encounter: Payer: Self-pay | Admitting: Family Medicine

## 2023-08-13 VITALS — BP 146/89 | HR 91 | Ht 64.0 in | Wt 161.0 lb

## 2023-08-13 DIAGNOSIS — J0110 Acute frontal sinusitis, unspecified: Secondary | ICD-10-CM | POA: Insufficient documentation

## 2023-08-13 MED ORDER — AZITHROMYCIN 250 MG PO TABS: ORAL_TABLET | ORAL | 0 refills | Status: DC

## 2023-08-13 NOTE — Assessment & Plan Note (Signed)
She has done well with z-pack previously.  Will use this to treat this time as well.  Encouraged supportive care at home.  Contact clinic if having new/worsening symptoms.

## 2023-08-13 NOTE — Progress Notes (Signed)
Belinda Owens - 89 y.o. female MRN [id number]  Date of birth: 1931/12/20  Subjective Chief Complaint  Patient presents with   Headache    HPI Belinda Owens is a 89 y.o. female here today with complaint of sinus pain and pressure. She does have headache with this as well.  Symptoms started 3-4 days ago and have been worsening since onset.  She has tried tylenol so far without much relief.  This feels like her typical sinus infections that she has 1-2x per year.  Z-pack has worked well for her in the past.   ROS:  A comprehensive ROS was completed and negative except as noted per HPI   Allergies  Allergen Reactions   Atorvastatin Other (See Comments)    Myalgia   Omeprazole Itching   Tylenol With Codeine #3  [Acetaminophen-Codeine] Nausea And Vomiting   Atrovent Nasal Spray [Ipratropium] Other (See Comments)    burning   Sulfa Antibiotics Dermatitis    Past Medical History:  Diagnosis Date   Fibrocystic breast changes of both breasts    Gets yearly mammo and [place]   History of rheumatic fever    Hypertension     Past Surgical History:  Procedure Laterality Date   BREAST BIOPSY     CARDIOVERSION N/A 03/28/2023   Procedure: CARDIOVERSION;  Surgeon: Chilton Si, MD;  Location: [facility] INVASIVE CV LAB;  Service: Cardiovascular;  Laterality: N/A;   MENISCUS REPAIR Right    RECONSTRUCTION OF EYELID Bilateral 12/14/2016   ROTATOR CUFF REPAIR Right     Social History   Socioeconomic History   Marital status: Divorced    Spouse name: Not on file   Number of children: 3   Years of education: 13   Highest education level: Some college, no degree  Occupational History   Occupation: retired.     Comment: [employer]  Tobacco Use   Smoking status: Former    Current packs/day: 0.00    Types: Cigarettes    Quit date: 10/23/1957    Years since quitting: 65.8   Smokeless tobacco: Never  Vaping Use   Vaping status: Never Used  Substance and Sexual Activity   Alcohol use:  Not Currently    Alcohol/week: 0.0 standard drinks of alcohol   Drug use: No   Sexual activity: Never    Partners: Male    Birth control/protection: None  Other Topics Concern   Not on file  Social History Narrative   Lives alone. Had three children, one has deceased. She enjoys playing scrabble.   Social Determinants of Health   Financial Resource Strain: Low Risk  (06/20/2023)   Overall Financial Resource Strain (CARDIA)    Difficulty of Paying Living Expenses: Not hard at all  Food Insecurity: No Food Insecurity (06/20/2023)   Hunger Vital Sign    Worried About Running Out of Food in the Last Year: Never true    Ran Out of Food in the Last Year: Never true  Transportation Needs: No Transportation Needs (06/20/2023)   PRAPARE - Administrator, Civil Service (Medical): No    Lack of Transportation (Non-Medical): No  Physical Activity: Insufficiently Active (06/20/2023)   Exercise Vital Sign    Days of Exercise per Week: 7 days    Minutes of Exercise per Session: 10 min  Stress: No Stress Concern Present (06/20/2023)   [employer] of Occupational Health - Occupational Stress Questionnaire    Feeling of Stress : Not at all  Social Connections: Moderately  Integrated (06/20/2023)   Social Connection and Isolation Panel [NHANES]    Frequency of Communication with Friends and Family: More than three times a week    Frequency of Social Gatherings with Friends and Family: More than three times a week    Attends Religious Services: More than 4 times per year    Active Member of Clubs or Organizations: Yes    Attends Banker Meetings: 1 to 4 times per year    Marital Status: Divorced    Family History  Problem Relation Age of Onset   Breast cancer Maternal Aunt    Colon cancer Father    Asthma Daughter    Heart disease Mother    Uterine cancer Mother    Cancer - Other Brother     Health Maintenance  Topic Date Due   Zoster Vaccines- Shingrix (1 of  2) Never done   COVID-19 Vaccine (4 - 2023-24 season) 06/24/2023   DTaP/Tdap/Td (2 - Td or Tdap) 03/23/2024   Medicare Annual Wellness (AWV)  04/08/2024   Pneumonia Vaccine 74+ Years old  Completed   INFLUENZA VACCINE  Completed   DEXA SCAN  Completed   HPV VACCINES  Aged Out     ----------------------------------------------------------------------------------------------------------------------------------------------------------------------------------------------------------------- Physical Exam BP (!) 146/89 (BP Location: Left Arm, Patient Position: Sitting, Cuff Size: Normal)   Pulse 91   Ht 5\' 4"  (1.626 m)   Wt 161 lb (73 kg)   SpO2 97%   BMI 27.64 kg/m   Physical Exam Constitutional:      Appearance: She is well-developed.  HENT:     Head:     Comments: Frontal sinus tenderness.  Eyes:     General: No scleral icterus. Cardiovascular:     Rate and Rhythm: Normal rate. Rhythm irregular.  Pulmonary:     Effort: Pulmonary effort is normal.     Breath sounds: Normal breath sounds.  Neurological:     General: No focal deficit present.     Mental Status: She is alert.  Psychiatric:        Mood and Affect: Mood normal.        Behavior: Behavior normal.     ------------------------------------------------------------------------------------------------------------------------------------------------------------------------------------------------------------------- Assessment and Plan  Acute frontal sinusitis She has done well with z-pack previously.  Will use this to treat this time as well.  Encouraged supportive care at home.  Contact clinic if having new/worsening symptoms.     Meds ordered this encounter  Medications   azithromycin (ZITHROMAX) 250 MG tablet    Sig: Take 2 tablets on day 1, then 1 tablet daily on days 2 through 5    Dispense:  6 tablet    Refill:  0    No follow-ups on file.    This visit occurred during the SARS-CoV-2 public health  emergency.  Safety protocols were in place, including screening questions prior to the visit, additional usage of staff PPE, and extensive cleaning of exam room while observing appropriate contact time as indicated for disinfecting solutions.

## 2023-08-14 ENCOUNTER — Ambulatory Visit: Payer: Medicare HMO | Admitting: Family Medicine

## 2023-08-16 ENCOUNTER — Ambulatory Visit: Payer: Medicare HMO | Admitting: Family Medicine

## 2023-08-16 ENCOUNTER — Encounter: Payer: Self-pay | Admitting: Family Medicine

## 2023-08-16 ENCOUNTER — Telehealth: Payer: Self-pay | Admitting: Family Medicine

## 2023-08-16 VITALS — BP 116/66 | HR 87 | Ht 64.0 in | Wt 158.0 lb

## 2023-08-16 DIAGNOSIS — N3000 Acute cystitis without hematuria: Secondary | ICD-10-CM | POA: Diagnosis not present

## 2023-08-16 LAB — POCT URINALYSIS DIP (CLINITEK)
Bilirubin, UA: NEGATIVE
Glucose, UA: NEGATIVE mg/dL
Ketones, POC UA: NEGATIVE mg/dL
Nitrite, UA: NEGATIVE
POC PROTEIN,UA: NEGATIVE
Spec Grav, UA: 1.025 (ref 1.010–1.025)
Urobilinogen, UA: 0.2 U/dL
pH, UA: 6.5 (ref 5.0–8.0)

## 2023-08-16 MED ORDER — NITROFURANTOIN MONOHYD MACRO 100 MG PO CAPS: 100.0000 mg | ORAL_CAPSULE | Freq: Two times a day (BID) | ORAL | 0 refills | Status: DC

## 2023-08-16 NOTE — Progress Notes (Signed)
Acute Office Visit  Subjective:     Patient ID: Belinda Owens, female    DOB: Mar 24, 1932, 89 y.o.   MRN: [id number]  Chief Complaint  Patient presents with   Urinary Tract Infection    Sxs x 1 day    HPI Patient is in today for started. Painful urination today.  No fevers chills or sweats.  No blood in the urine.  She denies any pelvic pain or low back pain.  She says when she was in her 2s and 18s she had a lot of UTIs but has not had one recently.  ROS      Objective:    BP 116/66   Pulse 87   Ht 5\' 4"  (1.626 m)   Wt 158 lb (71.7 kg)   SpO2 98%   BMI 27.12 kg/m    Physical Exam Vitals and nursing note reviewed.  Constitutional:      Appearance: Normal appearance.  HENT:     Head: Normocephalic and atraumatic.  Eyes:     Conjunctiva/sclera: Conjunctivae normal.  Cardiovascular:     Rate and Rhythm: Normal rate and regular rhythm.  Pulmonary:     Effort: Pulmonary effort is normal.     Breath sounds: Normal breath sounds.  Abdominal:     General: Bowel sounds are normal.     Palpations: Abdomen is soft.     Tenderness: There is no abdominal tenderness.  Skin:    General: Skin is warm and dry.  Neurological:     Mental Status: She is alert.  Psychiatric:        Mood and Affect: Mood normal.     Results for orders placed or performed in visit on 08/16/23  POCT URINALYSIS DIP (CLINITEK)  Result Value Ref Range   Color, UA yellow yellow   Clarity, UA clear clear   Glucose, UA negative negative mg/dL   Bilirubin, UA negative negative   Ketones, POC UA negative negative mg/dL   Spec Grav, UA 4.098 1.191 - 1.025   Blood, UA small (A) negative   pH, UA 6.5 5.0 - 8.0   POC PROTEIN,UA negative negative, trace   Urobilinogen, UA 0.2 0.2 or 1.0 E.U./dL   Nitrite, UA Negative Negative   Leukocytes, UA Trace (A) Negative        Assessment & Plan:   Problem List Items Addressed This Visit   None Visit Diagnoses     Acute cystitis without  hematuria    -  Primary   Relevant Orders   POCT URINALYSIS DIP (CLINITEK) (Completed)   Urine Culture      UTI-we will go ahead and treat with Macrobid based on allergy list.  Will send culture.  Call with results once available.  Make sure hydrating well.  Call if not better after the weekend.  Meds ordered this encounter  Medications   nitrofurantoin, macrocrystal-monohydrate, (MACROBID) 100 MG capsule    Sig: Take 1 capsule (100 mg total) by mouth 2 (two) times daily.    Dispense:  10 capsule    Refill:  0    No follow-ups on file.  Nani Gasser, MD

## 2023-08-16 NOTE — Telephone Encounter (Signed)
Needs appt, ok for virtual with any provider

## 2023-08-16 NOTE — Telephone Encounter (Signed)
Attempted call to patient. Left a voice mail message requesting a return call.  

## 2023-08-16 NOTE — Telephone Encounter (Signed)
Patient seen on Monday 21st for headache now she says she has a UTI She saw Dr. Tamera Punt  Please advise

## 2023-08-18 LAB — URINE CULTURE

## 2023-08-20 ENCOUNTER — Telehealth: Payer: Self-pay | Admitting: Family Medicine

## 2023-08-20 NOTE — Telephone Encounter (Signed)
Pt advised to call and schedule appt for pelvic exam.

## 2023-08-20 NOTE — Telephone Encounter (Signed)
Patient called in stating that she is still having a burning sensation after urination. Please advise

## 2023-08-20 NOTE — Progress Notes (Signed)
Hi Belinda Owens, your urine culture is negative it did not grow out any bacteria.  So if you are still having burning and irritation to the then you may need a pelvic exam to see if there may be some irritation or rash that is actually causing the discomfort.

## 2023-08-22 ENCOUNTER — Ambulatory Visit (INDEPENDENT_AMBULATORY_CARE_PROVIDER_SITE_OTHER): Payer: Medicare HMO | Admitting: Family Medicine

## 2023-08-22 ENCOUNTER — Encounter: Payer: Self-pay | Admitting: Family Medicine

## 2023-08-22 ENCOUNTER — Other Ambulatory Visit: Payer: Self-pay | Admitting: Family Medicine

## 2023-08-22 VITALS — BP 114/74 | HR 94 | Ht 64.0 in | Wt 158.0 lb

## 2023-08-22 DIAGNOSIS — F5101 Primary insomnia: Secondary | ICD-10-CM | POA: Diagnosis not present

## 2023-08-22 DIAGNOSIS — R102 Pelvic and perineal pain: Secondary | ICD-10-CM | POA: Diagnosis not present

## 2023-08-22 DIAGNOSIS — R300 Dysuria: Secondary | ICD-10-CM

## 2023-08-22 DIAGNOSIS — N342 Other urethritis: Secondary | ICD-10-CM

## 2023-08-22 DIAGNOSIS — E876 Hypokalemia: Secondary | ICD-10-CM

## 2023-08-22 DIAGNOSIS — I10 Essential (primary) hypertension: Secondary | ICD-10-CM

## 2023-08-22 LAB — POCT URINALYSIS DIP (CLINITEK)
Bilirubin, UA: NEGATIVE
Glucose, UA: NEGATIVE mg/dL
Ketones, POC UA: NEGATIVE mg/dL
Leukocytes, UA: NEGATIVE
Nitrite, UA: NEGATIVE
POC PROTEIN,UA: NEGATIVE
Spec Grav, UA: 1.015 (ref 1.010–1.025)
Urobilinogen, UA: 0.2 U/dL
pH, UA: 6 (ref 5.0–8.0)

## 2023-08-22 MED ORDER — PREMARIN 0.625 MG/GM VA CREA: TOPICAL_CREAM | VAGINAL | 4 refills | Status: AC

## 2023-08-22 NOTE — Progress Notes (Signed)
Acute Office Visit  Subjective:     Patient ID: Belinda Owens, female    DOB: 1931/12/09, 89 y.o.   MRN: [id number]  Chief Complaint  Patient presents with   Gynecologic Exam   Headache    Pt c/o experiencing a sinus headache.    HPI Patient is in today for burning with urination. She came in last week and had a negative urine culture but is still having sxs so asked her to come back in for pelvic exam. Reporst some occ nausea.  Last night had some LLQ pain that was intermittantly sharp.   ROS      Objective:    BP 114/74   Pulse 94   Ht 5\' 4"  (1.626 m)   Wt 158 lb (71.7 kg)   SpO2 98%   BMI 27.12 kg/m    Physical Exam Vitals and nursing note reviewed. Exam conducted with a chaperone present.  Constitutional:      Appearance: Normal appearance.  HENT:     Head: Normocephalic and atraumatic.  Eyes:     Conjunctiva/sclera: Conjunctivae normal.  Cardiovascular:     Rate and Rhythm: Normal rate and regular rhythm.  Pulmonary:     Effort: Pulmonary effort is normal.     Breath sounds: Normal breath sounds.  Abdominal:     General: Bowel sounds are normal.     Tenderness: There is abdominal tenderness.     Comments: Mildl tender in all 4 quadrants.   Genitourinary:    Exam position: Supine.     Labia:        Right: No rash or lesion.        Left: No rash or lesion.      Urethra: No prolapse or urethral lesion.  Skin:    General: Skin is warm and dry.  Neurological:     Mental Status: She is alert.  Psychiatric:        Mood and Affect: Mood normal.     Results for orders placed or performed in visit on 08/22/23  POCT URINALYSIS DIP (CLINITEK)  Result Value Ref Range   Color, UA straw (A) yellow   Clarity, UA clear clear   Glucose, UA negative negative mg/dL   Bilirubin, UA negative negative   Ketones, POC UA negative negative mg/dL   Spec Grav, UA 7.829 5.621 - 1.025   Blood, UA trace-intact (A) negative   pH, UA 6.0 5.0 - 8.0   POC PROTEIN,UA  negative negative, trace   Urobilinogen, UA 0.2 0.2 or 1.0 E.U./dL   Nitrite, UA Negative Negative   Leukocytes, UA Negative Negative        Assessment & Plan:   Problem List Items Addressed This Visit       Other   Insomnia   Other Visit Diagnoses     Dysuria    -  Primary   Relevant Orders   POCT URINALYSIS DIP (CLINITEK) (Completed)   Pelvic pain       Relevant Orders   WET PREP FOR TRICH, YEAST, CLUE   Urethritis           Urethritis - culture was neg. Repeat UA today. Exam normal and reassuring no rash, etc. Will start premarin cream. If not improving consider Urologic consult  Meds ordered this encounter  Medications   conjugated estrogens (PREMARIN) vaginal cream    Sig: Apply pea sized amount nightly to the urethra and outer vaginal area nightly for 2 weeks and then 3  x a week.    Dispense:  30 g    Refill:  4    No follow-ups on file.  Nani Gasser, MD

## 2023-08-23 LAB — WET PREP FOR TRICH, YEAST, CLUE
Clue Cell Exam: NEGATIVE
Trichomonas Exam: NEGATIVE
Yeast Exam: NEGATIVE

## 2023-08-23 NOTE — Progress Notes (Signed)
Call pt: Your swab is negative for yeast or bacteria.

## 2023-08-24 ENCOUNTER — Telehealth: Payer: Self-pay | Admitting: Family Medicine

## 2023-08-24 ENCOUNTER — Ambulatory Visit: Payer: Medicare HMO | Admitting: Family Medicine

## 2023-08-24 DIAGNOSIS — N342 Other urethritis: Secondary | ICD-10-CM

## 2023-08-24 DIAGNOSIS — R102 Pelvic and perineal pain: Secondary | ICD-10-CM

## 2023-08-24 NOTE — Telephone Encounter (Signed)
Patient called and stated she is still having significant pain she has been using the vaginal cream for the last couple days.  Will go ahead and schedule pelvic ultrasound for further workup since so far she has had 2 urine samples that were negative for infection and no rash on pelvic exam.  Continue with Premarin cream through the weekend.  Orders Placed This Encounter  Procedures   US Pelvic Complete With Transvaginal    Standing Status:   Future    Standing Expiration Date:   08/23/2024    Order Specific Question:   Reason for Exam (SYMPTOM  OR DIAGNOSIS REQUIRED)    Answer:   pelvic pain    Order Specific Question:   Preferred imaging location?    Answer:   [facility]

## 2023-08-27 ENCOUNTER — Telehealth: Payer: Self-pay | Admitting: Family Medicine

## 2023-08-27 NOTE — Telephone Encounter (Signed)
Patient states still burning around the outside of vagina she says the cream isn't helping  Please Belinda Owens

## 2023-08-28 ENCOUNTER — Telehealth: Payer: Self-pay | Admitting: Family Medicine

## 2023-08-28 MED ORDER — LIDOCAINE HCL 2 % EX GEL: 1.0000 | Freq: Three times a day (TID) | CUTANEOUS | 0 refills | Status: DC

## 2023-08-28 NOTE — Telephone Encounter (Signed)
Called into Pharmacy due to patient calling in stating that the pharmacy did not have the 2% lidocaine jelly in stock until Friday. Pharmacy states that they have 5% patches or jelly

## 2023-08-28 NOTE — Telephone Encounter (Signed)
Rx sent for lidocain gel. Make sure using a mild soap and not using anything astringent like peroxide, alcohol, etc.  Apply moisturizer after bath as well.

## 2023-08-28 NOTE — Telephone Encounter (Signed)
Patient was informed of provider's recommendations. However, she has requested to return a call back because she was at the polls voting.

## 2023-08-28 NOTE — Telephone Encounter (Signed)
Patient called in stating that she is having bubbles in the toilet bowl after urination.

## 2023-08-29 NOTE — Telephone Encounter (Signed)
Please given OK for the jelly

## 2023-08-29 NOTE — Telephone Encounter (Signed)
OK for nurse visit fir UA and culture. Clean catch

## 2023-08-30 ENCOUNTER — Ambulatory Visit (INDEPENDENT_AMBULATORY_CARE_PROVIDER_SITE_OTHER): Payer: Medicare HMO

## 2023-08-30 DIAGNOSIS — R102 Pelvic and perineal pain: Secondary | ICD-10-CM

## 2023-08-30 DIAGNOSIS — N342 Other urethritis: Secondary | ICD-10-CM

## 2023-08-30 NOTE — Progress Notes (Signed)
Call patient and let her know that the ultrasound the pelvis actually came back normal and reassuring they did see what look like a possible uterine fibroid that was pretty small around 2 cm so it is not enough that it should be causing a lot of pain or discomfort.  Would like to refer her to GYN.  We do have a GYN here in our office if that would be convenient for her.  Has She had a chance to try the lidocaine jelly?

## 2023-08-30 NOTE — Telephone Encounter (Signed)
Pt advised.

## 2023-09-02 ENCOUNTER — Other Ambulatory Visit: Payer: Self-pay | Admitting: Family Medicine

## 2023-09-02 DIAGNOSIS — I4891 Unspecified atrial fibrillation: Secondary | ICD-10-CM

## 2023-09-03 ENCOUNTER — Ambulatory Visit: Payer: Medicare HMO | Admitting: Internal Medicine

## 2023-09-04 NOTE — Telephone Encounter (Signed)
Per Pharmacist Huntley Dec - lidocaine jelly is not available in the 2% or 5 % packets for jelly/gel form. Insurance will not cover this rx because it is available OTC. Patient has been contacted and updated of the following. She will stop by the pharmacy to get OTC lidocaine gel. Patient is aware to reach out the the [facility] pharmacist if she has any inquiries.

## 2023-09-05 ENCOUNTER — Other Ambulatory Visit: Payer: Self-pay | Admitting: Family Medicine

## 2023-09-05 DIAGNOSIS — I10 Essential (primary) hypertension: Secondary | ICD-10-CM

## 2023-09-05 DIAGNOSIS — E876 Hypokalemia: Secondary | ICD-10-CM

## 2023-09-06 ENCOUNTER — Telehealth: Payer: Self-pay | Admitting: Family Medicine

## 2023-09-06 ENCOUNTER — Other Ambulatory Visit: Payer: Self-pay | Admitting: Family Medicine

## 2023-09-06 DIAGNOSIS — R102 Pelvic and perineal pain: Secondary | ICD-10-CM

## 2023-09-06 DIAGNOSIS — R300 Dysuria: Secondary | ICD-10-CM | POA: Diagnosis not present

## 2023-09-06 DIAGNOSIS — N3001 Acute cystitis with hematuria: Secondary | ICD-10-CM | POA: Diagnosis not present

## 2023-09-06 DIAGNOSIS — B9789 Other viral agents as the cause of diseases classified elsewhere: Secondary | ICD-10-CM | POA: Diagnosis not present

## 2023-09-06 NOTE — Telephone Encounter (Signed)
Ok please stop the lidocaine and go back to using aquaphor or coconut oil  we did place referral for gyn today.

## 2023-09-06 NOTE — Progress Notes (Signed)
Orders Placed This Encounter  Procedures   Ambulatory referral to Obstetrics / Gynecology    Referral Priority:   Routine    Referral Type:   Consultation    Referral Reason:   Specialty Services Required    Requested Specialty:   Obstetrics and Gynecology    Number of Visits Requested:   1    

## 2023-09-06 NOTE — Telephone Encounter (Signed)
Patient called in stating that she is burning using the lidocaine and it's not helping her at all.

## 2023-09-18 ENCOUNTER — Ambulatory Visit (INDEPENDENT_AMBULATORY_CARE_PROVIDER_SITE_OTHER): Payer: Medicare HMO | Admitting: Family Medicine

## 2023-09-18 ENCOUNTER — Encounter: Payer: Self-pay | Admitting: Family Medicine

## 2023-09-18 VITALS — BP 110/74 | HR 92 | Resp 12 | Ht 64.0 in | Wt 158.1 lb

## 2023-09-18 DIAGNOSIS — R300 Dysuria: Secondary | ICD-10-CM | POA: Diagnosis not present

## 2023-09-18 DIAGNOSIS — K146 Glossodynia: Secondary | ICD-10-CM

## 2023-09-18 LAB — POCT URINALYSIS DIP (CLINITEK)
Bilirubin, UA: NEGATIVE
Glucose, UA: NEGATIVE mg/dL
Ketones, POC UA: NEGATIVE mg/dL
Nitrite, UA: NEGATIVE
POC PROTEIN,UA: NEGATIVE
Spec Grav, UA: 1.015 (ref 1.010–1.025)
Urobilinogen, UA: 0.2 U/dL
pH, UA: 7 (ref 5.0–8.0)

## 2023-09-18 MED ORDER — NITROFURANTOIN MONOHYD MACRO 100 MG PO CAPS: 100.0000 mg | ORAL_CAPSULE | Freq: Two times a day (BID) | ORAL | 0 refills | Status: DC

## 2023-09-18 NOTE — Progress Notes (Signed)
Established Patient Office Visit  Subjective   Patient ID: Belinda Owens, female    DOB: Mar 10, 1932  Age: 89 y.o. MRN: [id number]  No chief complaint on file.   HPI  Is a phone call and noted at [facility] where she went to the [place] urgent care [facility])  on November 14 for urinary symptoms and was prescribed Keflex she took the first dose and started noticing a tongue burning and tingling sensation no swelling and no trouble breathing she wondered if she was having a reaction to the antibiotic.she says the burning is at end of urination.  Using lidocaine cream.  No hematuria.    She also complains of tongue burning it started after she started the cephalexin.  She was not sure if it was a side effect but she did could not continue and complete the antibiotic.    ROS    Objective:     BP 110/74   Pulse 92   Resp 12   Ht 5\' 4"  (1.626 m)   Wt 158 lb 1.3 oz (71.7 kg)   SpO2 98%   BMI 27.13 kg/m    Physical Exam Vitals and nursing note reviewed.  Constitutional:      Appearance: Normal appearance.  HENT:     Head: Normocephalic and atraumatic.     Comments: Does have a little bit more pink and redness on the left side of her tongue but no distinct lesions or blisters. Eyes:     Conjunctiva/sclera: Conjunctivae normal.  Cardiovascular:     Rate and Rhythm: Normal rate and regular rhythm.  Pulmonary:     Effort: Pulmonary effort is normal.     Breath sounds: Normal breath sounds.  Skin:    General: Skin is warm and dry.  Neurological:     Mental Status: She is alert.  Psychiatric:        Mood and Affect: Mood normal.      Results for orders placed or performed in visit on 09/18/23  POCT URINALYSIS DIP (CLINITEK)  Result Value Ref Range   Color, UA yellow yellow   Clarity, UA clear clear   Glucose, UA negative negative mg/dL   Bilirubin, UA negative negative   Ketones, POC UA negative negative mg/dL   Spec Grav, UA 6.295 2.841 - 1.025   Blood, UA  trace-lysed (A) negative   pH, UA 7.0 5.0 - 8.0   POC PROTEIN,UA negative negative, trace   Urobilinogen, UA 0.2 0.2 or 1.0 E.U./dL   Nitrite, UA Negative Negative   Leukocytes, UA Trace (A) Negative      The ASCVD Risk score (Arnett DK, et al., 2019) failed to calculate for the following reasons:   The 2019 ASCVD risk score is only valid for ages 1 to 46    Assessment & Plan:   Problem List Items Addressed This Visit   None Visit Diagnoses     Dysuria    -  Primary   Relevant Orders   POCT URINALYSIS DIP (CLINITEK) (Completed)   Urine Culture   Tongue burning sensation       Relevant Orders   Fe+TIBC+Fer   Vitamin B1   Vitamin B6   B12      Dysuria-will send urine sample for culture.  Will treat with Macrobid since we are going to the long holiday weekend.  Call if any new or worsening symptoms.  Tongue burning sensation will check for B vitamin deficiencies and iron deficiency.  Keep mouth well moisturized.  If not improving please let us know.  No follow-ups on file.    Nani Gasser, MD

## 2023-09-18 NOTE — Patient Instructions (Signed)
In regards to the tongue burning try to keep mouth well moisturized try to avoid any trauma or friction to the area.  Avoid really hot or real cold foods until it feels better. If it is from the antibiotic it should improve over the next several days.

## 2023-09-19 ENCOUNTER — Encounter: Payer: Self-pay | Admitting: Family Medicine

## 2023-09-19 DIAGNOSIS — E611 Iron deficiency: Secondary | ICD-10-CM | POA: Insufficient documentation

## 2023-09-19 NOTE — Progress Notes (Signed)
Call patient: Her iron is low.  Is she noticing any blood in her stool or urine?  Is she taking any extra iron currently. See if can pick up stool cards and please start taking an daily OTC iron.  Iron can be constipating  so may need to take a stool softener with it.  B12 looks good. So if taking B12 can decrease how often she is taking it.

## 2023-09-23 LAB — IRON,TIBC AND FERRITIN PANEL
Ferritin: 15 ng/mL (ref 15–150)
Iron Saturation: 5 % — CL (ref 15–55)
Iron: 20 ug/dL — ABNORMAL LOW (ref 27–139)
Total Iron Binding Capacity: 419 ug/dL (ref 250–450)
UIBC: 399 ug/dL — ABNORMAL HIGH (ref 118–369)

## 2023-09-23 LAB — VITAMIN B12: Vitamin B-12: >2000 pg/mL — ABNORMAL HIGH (ref 232–1245)

## 2023-09-23 LAB — VITAMIN B1: Thiamine: 109.5 nmol/L (ref 66.5–200.0)

## 2023-09-23 LAB — VITAMIN B6

## 2023-09-24 ENCOUNTER — Ambulatory Visit: Payer: Medicare HMO | Admitting: Sports Medicine

## 2023-09-24 LAB — URINE CULTURE

## 2023-09-24 NOTE — Progress Notes (Signed)
Patient: Urine culture came back positive the antibiotic that I put her on should work based on the culture report.  Is she using the topical estrogen regularly?  Try to prevent the UTIs.

## 2023-09-26 NOTE — Progress Notes (Unsigned)
GYNECOLOGY OFFICE VISIT NOTE  History:   Belinda Owens is a 89 y.o. G3P3000 here today for pelvic pain. This has since improved since doing the antibiotics. She still has the vulvar burning which is primarily located on the left inner vulva.  She had an [place] ordered through Dr. Linford Arnold and that was overall normal. Her EL was 7 mm but she is asymptomatic (not bleeding).  She had a urine culture done on 11/26 which did show UTI.  She was sent antibiotics and took them. She was also recommended vaginal estrogen to try to prevent UTIs. She is has been using the vaginal therapy but without relief of the burning.   The following portions of the patient's history were reviewed and updated as appropriate: allergies, current medications, past family history, past medical history, past social history, past surgical history and problem list.   Review of Systems:  Pertinent items noted in HPI and remainder of comprehensive ROS otherwise negative.  Physical Exam:  BP 114/75   Pulse (!) 102   Ht 5\' 4"  (1.626 m)   Wt 157 lb (71.2 kg)   BMI 26.95 kg/m  CONSTITUTIONAL: Well-developed, well-nourished female in no acute distress.  HEENT:  Normocephalic, atraumatic. External right and left ear normal. No scleral icterus.  NECK: Normal range of motion, supple, no masses noted on observation SKIN: No rash noted. Not diaphoretic. No erythema. No pallor. MUSCULOSKELETAL: Normal range of motion. No edema noted. NEUROLOGIC: Alert and oriented to person, place, and time. Normal muscle tone coordination. No cranial nerve deficit noted. PSYCHIATRIC: Normal mood and affect. Normal behavior. Normal judgment and thought content.  PELVIC: Normal appearing external genitalia - small amount of white discharge on introitus and in area of burning, no erythema or skin changes.; normal urethral meatus;    Performed in the presence of a chaperone  Labs and Imaging No results found for this or any previous visit (from the  past 168 hour(s)). US Pelvic Complete With Transvaginal  Result Date: 08/30/2023 CLINICAL DATA:  Pelvic pain. EXAM: TRANSABDOMINAL AND TRANSVAGINAL ULTRASOUND OF PELVIS TECHNIQUE: Both transabdominal and transvaginal ultrasound examinations of the pelvis were performed. Transabdominal technique was performed for global imaging of the pelvis including uterus, ovaries, adnexal regions, and pelvic cul-de-sac. It was necessary to proceed with endovaginal exam following the transabdominal exam to visualize the endometrium and uterus. COMPARISON:  None Available. FINDINGS: Uterus Measurements: 8.7 x 4.5 x 6.2 cm = volume: 127.2 mL. There is a questioned 2.8 x 2.6 x 2.8 cm uterine fibroid in the fundus. Endometrium Thickness: 7.6 mm.  No focal abnormality visualized. Right ovary Not visualized. Left ovary Not visualized. Other findings No abnormal free fluid. IMPRESSION: 1. No acute abnormality identified. 2. Questioned 2.8 x 2.6 x 2.8 cm uterine fibroid in the fundus. 3. Bilateral ovaries not visualized. Electronically Signed   By: Sherian Rein M.D.   On: 08/30/2023 11:52    Assessment and Plan:   1. Pelvic pain Symptoms more consistent with vulvar burning.  - Will check cultures. Given white discharge we will do diflucan while awaiting results.  - Discussed can try topical compounded gabapentin if symptoms persistent and culture negative - Also possible this may be a chronic issue that isn't resolved with medication. She was comfortable with this possibility.   2. Urinary tract infection symptoms Now resolved    Meds ordered this encounter  Medications   fluconazole (DIFLUCAN) 150 MG tablet    Sig: Take 1 tablet (150 mg total) by  mouth every 3 (three) days. For three doses    Dispense:  3 tablet    Refill:  3     Routine preventative health maintenance measures emphasized. Please refer to After Visit Summary for other counseling recommendations.   Return if symptoms worsen or fail to  improve.  Milas Hock, MD, FACOG Obstetrician & Gynecologist, [facility] for [facility], [facility]

## 2023-09-27 ENCOUNTER — Ambulatory Visit: Payer: Medicare HMO | Admitting: Obstetrics and Gynecology

## 2023-09-27 ENCOUNTER — Encounter: Payer: Self-pay | Admitting: Obstetrics and Gynecology

## 2023-09-27 ENCOUNTER — Other Ambulatory Visit (HOSPITAL_COMMUNITY)
Admission: RE | Admit: 2023-09-27 | Discharge: 2023-09-27 | Disposition: A | Discharge status: Home / Self Care | Payer: Medicare HMO | Source: Ambulatory Visit | Attending: Obstetrics and Gynecology | Admitting: Obstetrics and Gynecology

## 2023-09-27 VITALS — BP 114/75 | HR 102 | Ht 64.0 in | Wt 157.0 lb

## 2023-09-27 DIAGNOSIS — R399 Unspecified symptoms and signs involving the genitourinary system: Secondary | ICD-10-CM

## 2023-09-27 DIAGNOSIS — N898 Other specified noninflammatory disorders of vagina: Secondary | ICD-10-CM

## 2023-09-27 DIAGNOSIS — B379 Candidiasis, unspecified: Secondary | ICD-10-CM

## 2023-09-27 DIAGNOSIS — R102 Pelvic and perineal pain: Secondary | ICD-10-CM | POA: Diagnosis not present

## 2023-09-27 MED ORDER — FLUCONAZOLE 150 MG PO TABS: 150.0000 mg | ORAL_TABLET | ORAL | 3 refills | Status: DC

## 2023-09-27 NOTE — Progress Notes (Signed)
I would like to get her in with gynecology is she is Ok with that.  Place refer to gyn down the hall if she is ok with it

## 2023-09-28 LAB — CERVICOVAGINAL ANCILLARY ONLY
Bacterial Vaginitis (gardnerella): NEGATIVE
Candida Glabrata: POSITIVE — AB
Candida Vaginitis: NEGATIVE
Comment: NEGATIVE
Comment: NEGATIVE
Comment: NEGATIVE

## 2023-09-28 MED ORDER — BORIC ACID CRYS: 600.0000 mg | CRYSTALS | Freq: Every day | 2 refills | Status: AC

## 2023-09-28 NOTE — Addendum Note (Signed)
Addended by: Milas Hock A on: 09/28/2023 02:21 PM   Modules accepted: Orders

## 2023-10-04 ENCOUNTER — Telehealth: Payer: Self-pay

## 2023-10-04 LAB — POC HEMOCCULT BLD/STL (HOME/3-CARD/SCREEN)
Card #1 Date: 12082024
Card #2 Date: 12102024
Card #2 Fecal Occult Blod, POC: POSITIVE
Card #3 Date: 12112024
Card #3 Fecal Occult Blood, POC: NEGATIVE
Fecal Occult Blood, POC: POSITIVE — AB

## 2023-10-04 NOTE — Telephone Encounter (Signed)
Copied from CRM [id number]. Topic: General - Call Back - No Documentation >> Oct 02, 2023  4:28 PM Prudencio Pair wrote: Reason for CRM: Patient states someone left her a message to call back. Checked chart and didn't see any notes or messages. Please give patient a call back to advise of the reason for the call. Pt's CB #: [phone].

## 2023-10-05 ENCOUNTER — Other Ambulatory Visit: Payer: Self-pay | Admitting: Family Medicine

## 2023-10-05 DIAGNOSIS — I10 Essential (primary) hypertension: Secondary | ICD-10-CM

## 2023-10-09 ENCOUNTER — Ambulatory Visit: Payer: Medicare HMO | Admitting: Physician Assistant

## 2023-10-09 ENCOUNTER — Telehealth: Payer: Self-pay

## 2023-10-09 NOTE — Telephone Encounter (Signed)
Per patient - her symptoms started yesterday. Experiencing burning in the urethra and outer labia area. Denies frequency or urgency. Patient has been scheduled tomorrow at 1130 am.

## 2023-10-09 NOTE — Telephone Encounter (Signed)
Copied from CRM [id number]. Topic: Clinical - Medication Question >> Oct 09, 2023  2:19 PM Fonda Kinder J wrote: Reason for CRM: Pt is experiencing an UTI and wants to know if she could be prescribed Ciprofloxacin HCL 500 mg or does she have to come in for an appointment. Pt has been through this before and was prescribed the medication by previous urologist for UTI but she doesn't have any refills.

## 2023-10-10 ENCOUNTER — Ambulatory Visit: Payer: Medicare HMO | Admitting: Family Medicine

## 2023-10-11 NOTE — Progress Notes (Signed)
She did call the other day about possible UTI symptoms does she want to come by and drop off a another urinalysis and culture sample?

## 2023-10-12 ENCOUNTER — Ambulatory Visit (INDEPENDENT_AMBULATORY_CARE_PROVIDER_SITE_OTHER): Payer: Medicare HMO | Admitting: Family Medicine

## 2023-10-12 ENCOUNTER — Encounter: Payer: Self-pay | Admitting: Family Medicine

## 2023-10-12 ENCOUNTER — Other Ambulatory Visit (INDEPENDENT_AMBULATORY_CARE_PROVIDER_SITE_OTHER): Payer: Medicare HMO | Admitting: *Deleted

## 2023-10-12 VITALS — BP 111/70 | HR 104 | Temp 97.6°F | Resp 12 | Ht 64.0 in | Wt 157.0 lb

## 2023-10-12 DIAGNOSIS — R3129 Other microscopic hematuria: Secondary | ICD-10-CM

## 2023-10-12 DIAGNOSIS — R300 Dysuria: Secondary | ICD-10-CM | POA: Diagnosis not present

## 2023-10-12 DIAGNOSIS — R195 Other fecal abnormalities: Secondary | ICD-10-CM

## 2023-10-12 DIAGNOSIS — N3000 Acute cystitis without hematuria: Secondary | ICD-10-CM

## 2023-10-12 DIAGNOSIS — E611 Iron deficiency: Secondary | ICD-10-CM

## 2023-10-12 LAB — POCT URINALYSIS DIP (CLINITEK)
Bilirubin, UA: NEGATIVE
Glucose, UA: NEGATIVE mg/dL
Leukocytes, UA: NEGATIVE
Nitrite, UA: NEGATIVE
POC PROTEIN,UA: 100 — AB
Spec Grav, UA: 1.025 (ref 1.010–1.025)
Urobilinogen, UA: 0.2 U/dL
pH, UA: 6 (ref 5.0–8.0)

## 2023-10-12 MED ORDER — NITROFURANTOIN MONOHYD MACRO 100 MG PO CAPS: 100.0000 mg | ORAL_CAPSULE | Freq: Two times a day (BID) | ORAL | 0 refills | Status: DC

## 2023-10-12 NOTE — Progress Notes (Signed)
Acute Office Visit  Subjective:     Patient ID: DODDIE DOKE, female    DOB: July 16, 1932, 89 y.o.   MRN: [id number]  Chief Complaint  Patient presents with   Urinary Tract Infection    HPI Patient is in today for burning in the urethral area off and on for several months. Sxs started again. . No blood, or fever, etc.  Has seen gyn and has upcoming Urology appt in Feb.  Has tried lidocaine cream and estrogen cream.   Treated with Macrobid for Enterococcus faecalis on 09/18/23 Treated with Keflex on 11/14 at UC Neg UA on 10/30 Urine cx neg on 10/24  F/U in Feb DR Andrey Campanile, Urology  Recent labs showed low iron and she did bring in her stool cards today.   ROS      Objective:    BP 111/70 (BP Location: Left Arm, Patient Position: Sitting)   Pulse (!) 104   Temp 97.6 F (36.4 C)   Resp 12   Ht 5\' 4"  (1.626 m)   Wt 157 lb (71.2 kg)   SpO2 99%   BMI 26.95 kg/m    Physical Exam Vitals reviewed.  Constitutional:      Appearance: Normal appearance.  HENT:     Head: Normocephalic.  Pulmonary:     Effort: Pulmonary effort is normal.  Neurological:     Mental Status: She is alert and oriented to person, place, and time.  Psychiatric:        Mood and Affect: Mood normal.        Behavior: Behavior normal.     Results for orders placed or performed in visit on 10/12/23  POCT URINALYSIS DIP (CLINITEK)  Result Value Ref Range   Color, UA yellow yellow   Clarity, UA cloudy (A) clear   Glucose, UA negative negative mg/dL   Bilirubin, UA negative negative   Ketones, POC UA trace (5) (A) negative mg/dL   Spec Grav, UA 4.098 1.191 - 1.025   Blood, UA trace-lysed (A) negative   pH, UA 6.0 5.0 - 8.0   POC PROTEIN,UA =100 (A) negative, trace   Urobilinogen, UA 0.2 0.2 or 1.0 E.U./dL   Nitrite, UA Negative Negative   Leukocytes, UA Negative Negative  Results for orders placed or performed in visit on 10/12/23  POC Hemoccult Bld/Stl (3-Cd Home Screen)  Result Value  Ref Range   Card #1 Date [id number]    Fecal Occult Blood, POC Positive (A) Negative   Card #2 Date [id number]    Card #2 Fecal Occult Blod, POC Positive    Card #3 Date [id number]    Card #3 Fecal Occult Blood, POC Negative         Assessment & Plan:   Problem List Items Addressed This Visit       Other   Iron deficiency   One of 3 stool cards positive. Will refer to GI for further workup for iron def.        Other Visit Diagnoses       Dysuria    -  Primary   Relevant Orders   POCT URINALYSIS DIP (CLINITEK) (Completed)   Urine Culture     Acute cystitis without hematuria          Dysuria - UA looks OK except trace blood. Will start macrobid. Culture sent.  Will call with results.     Meds ordered this encounter  Medications   nitrofurantoin, macrocrystal-monohydrate, (MACROBID) 100 MG capsule  Sig: Take 1 capsule (100 mg total) by mouth 2 (two) times daily.    Dispense:  10 capsule    Refill:  0    No follow-ups on file.  Nani Gasser, MD

## 2023-10-12 NOTE — Progress Notes (Signed)
Call patient one of the stool samples was positive for blood and with her low iron I would like for her to be seen by GI for further evaluation.  If there is someone she is seen in the past and would like this again please let us know if not we are happy to refer her locally.

## 2023-10-12 NOTE — Assessment & Plan Note (Signed)
One of 3 stool cards positive. Will refer to GI for further workup for iron def.

## 2023-10-12 NOTE — Telephone Encounter (Signed)
Spoke with patient.

## 2023-10-15 LAB — URINE CULTURE

## 2023-10-15 NOTE — Progress Notes (Signed)
Call patient: Urine culture is actually negative so it is not growing any bacteria.  If she is completed the antibiotic that is fine if she still has some leftover she can go ahead and stop it I would be curious to see if her symptoms feel any better.

## 2023-10-19 ENCOUNTER — Other Ambulatory Visit: Payer: Self-pay

## 2023-10-21 MED ORDER — AZITHROMYCIN 250 MG PO TABS: ORAL_TABLET | ORAL | 0 refills | Status: AC

## 2023-10-21 NOTE — Addendum Note (Signed)
Addended by: Nani Gasser D on: 10/21/2023 11:43 AM   Modules accepted: Orders

## 2023-10-21 NOTE — Progress Notes (Signed)
Zpack sent to pharmacy.  Does she have appt with Urology?

## 2023-10-21 NOTE — Progress Notes (Signed)
Orders Placed This Encounter  Procedures   Ambulatory referral to Gastroenterology    Referral Priority:   Routine    Referral Type:   Consultation    Referral Reason:   Specialty Services Required    Number of Visits Requested:   1   POC Hemoccult Bld/Stl (3-Cd Home Screen)    Standing Status:   Future    Number of Occurrences:   1    Expected Date:   10/26/2023    Expiration Date:   10/11/2024

## 2023-10-21 NOTE — Addendum Note (Signed)
Addended by: Nani Gasser D on: 10/21/2023 11:42 AM   Modules accepted: Orders

## 2023-11-01 ENCOUNTER — Ambulatory Visit: Payer: Self-pay | Admitting: Family Medicine

## 2023-11-01 NOTE — Telephone Encounter (Signed)
  Chief Complaint: urethral pain post void Frequency: 2 weeks Pertinent Negatives: Patient denies fever, denies blood in urine, denies inability to empty bladder, denies back/abd pain, denies open sores in genital area  Disposition: [] ED /[] Urgent Care (no appt availability in office) / [x] Appointment(In office/virtual)/ []  [place] [facility]/ [] Home Care/ [] Refused Recommended Disposition /[] [place] [facility]/ []  Follow-up with PCP  Additional Notes: Pt scheduled for appt tomorrow AM, advised to seek UC treatment today or call us  back should she develop more s/s. Pt confirms and is agreeable. Denies hx of UTIs.  Copied from CRM [id number]. Topic: Clinical - Medical Advice >> Nov 01, 2023  1:43 PM Ivette P wrote: Reason for CRM: Pt has a problem with burning in urethra and would like advice on what to do and if she could possibly get some medication. Reason for Disposition  All other patients with painful urination  (Exception: [1] EITHER frequency or urgency AND [2] has on-call doctor.)  Answer Assessment - Initial Assessment Questions 1. SEVERITY: How bad is the pain?  (e.g., Scale 1-10; mild, moderate, or severe)   - MILD (1-3): complains slightly about urination hurting   - MODERATE (4-7): interferes with normal activities     - SEVERE (8-10): excruciating, unwilling or unable to urinate because of the pain      Burning after she urinates, 8/10  2. FREQUENCY: How many times have you had painful urination today?      Is not urinating more frequent than normal 3. PATTERN: Is pain present every time you urinate or just sometimes?      Always after voiding 4. ONSET: When did the painful urination start?      2 weeks 5. FEVER: Do you have a fever? If Yes, ask: What is your temperature, how was it measured, and when did it start?     denies 6. PAST UTI: Have you had a urine infection before? If Yes, ask: When was the last time? and What happened that time?      No  hx of UTI,  7. CAUSE: What do you think is causing the painful urination?  (e.g., UTI, scratch, Herpes sore)     UTI 8. OTHER SYMPTOMS: Do you have any other symptoms? (e.g., blood in urine, flank pain, genital sores, urgency, vaginal discharge)     denies  Protocols used: Urination Pain - Female-A-AH

## 2023-11-02 ENCOUNTER — Ambulatory Visit: Payer: Medicare HMO | Admitting: Medical-Surgical

## 2023-11-05 ENCOUNTER — Ambulatory Visit (INDEPENDENT_AMBULATORY_CARE_PROVIDER_SITE_OTHER): Payer: Medicare HMO | Admitting: Family Medicine

## 2023-11-05 ENCOUNTER — Encounter: Payer: Self-pay | Admitting: Family Medicine

## 2023-11-05 VITALS — BP 119/72 | HR 102 | Ht 64.0 in | Wt 151.0 lb

## 2023-11-05 DIAGNOSIS — R300 Dysuria: Secondary | ICD-10-CM

## 2023-11-05 LAB — POCT URINALYSIS DIP (CLINITEK)
Bilirubin, UA: NEGATIVE
Glucose, UA: NEGATIVE mg/dL
Ketones, POC UA: NEGATIVE mg/dL
Nitrite, UA: NEGATIVE
Spec Grav, UA: 1.015 (ref 1.010–1.025)
Urobilinogen, UA: 0.2 U/dL
pH, UA: 6 (ref 5.0–8.0)

## 2023-11-05 NOTE — Progress Notes (Signed)
   Established Patient Office Visit  Subjective  Patient ID: Belinda Owens, female    DOB: 05/05/1932  Age: 89 y.o. MRN: [id number]  Chief Complaint  Patient presents with   vaginal burning    HPI  Patient is in today for burning in the urethral area off and on for several months. Sxs started again.Belinda Owens feels like the pain is on the outside and inside of the urethra now.  Now.  No rash. Has been using her premarin  cream. No pain or irritation with the cream.  She has had a few urine samples that were positive for UTI and some that were negative.  She always feels better after round of antibiotics.  Saw gyn - 12/5 Appt 2/19 wth urology.     12/20 - urine cx neg.   11/26 - Treated with Macrobid  for Enterococcus faecalis 11/14 - Treated with Keflex  on at [facility] 10/30 - Neg UA 10/24 - Urine cx neg     ROS    Objective:     BP 119/72   Pulse (!) 102   Ht 5' 4 (1.626 m)   Wt 151 lb (68.5 kg)   SpO2 97%   BMI 25.92 kg/m    Physical Exam Genitourinary:      Comments: There are looks normal no concerning lesions or erythema but there is a little bit of irritation around at the intravaginal opening.     Results for orders placed or performed in visit on 11/05/23  POCT URINALYSIS DIP (CLINITEK)  Result Value Ref Range   Color, UA yellow yellow   Clarity, UA cloudy (A) clear   Glucose, UA negative negative mg/dL   Bilirubin, UA negative negative   Ketones, POC UA negative negative mg/dL   Spec Grav, UA 8.984 8.989 - 1.025   Blood, UA trace-lysed (A) negative   pH, UA 6.0 5.0 - 8.0   POC PROTEIN,UA trace negative, trace   Urobilinogen, UA 0.2 0.2 or 1.0 E.U./dL   Nitrite, UA Negative Negative   Leukocytes, UA Small (1+) (A) Negative      The ASCVD Risk score (Arnett DK, et al., 2019) failed to calculate for the following reasons:   The 2019 ASCVD risk score is only valid for ages 40 to 35    Assessment & Plan:   Problem List Items Addressed This Visit    None Visit Diagnoses       Burning with urination    -  Primary   Relevant Orders   POCT URINALYSIS DIP (CLINITEK) (Completed)   NuSwab Vaginitis Plus (VG+)   Urine Culture       Also is negative for nitrites but will send for culture to confirm once again.  I did do an exam today and did notice a little bit of erythema around the vaginal opening opening.  No rash etc. around the urethral opening.  She does have an upcoming appointment in February with urology which I think would be helpful she does remember having us  couple of cystoscopies years ago when she lived in [place]  but cannot remember exactly why they were done.  We also decided to go ahead and do a new swab today since I did see some vaginal irritation.  Will call with results once available.  Sinew with topical Premarin  cream for now.  We also discussed that it could be an irritated nerve causing pain in that area.  No follow-ups on file.    Dorothyann Byars, MD

## 2023-11-05 NOTE — Progress Notes (Signed)
 Pt has an upcoming appt with Urology on 2/19. She hasn't heard back from [facility].   Pt stated that the burning sensation happens 10 mins after she urinates. The burning is both internal and external and the sensation lingers long after she urinates.  The Premarin  does help however, it only helps for a few moments and the burning comes back. She denies any itching.

## 2023-11-07 LAB — NUSWAB VAGINITIS PLUS (VG+)
Candida albicans, NAA: NEGATIVE
Candida glabrata, NAA: NEGATIVE
Chlamydia trachomatis, NAA: NEGATIVE
Neisseria gonorrhoeae, NAA: NEGATIVE
Trich vag by NAA: NEGATIVE

## 2023-11-07 NOTE — Progress Notes (Signed)
 Call patient: Swab is normal no sign of overgrowth of yeast etc.  Still awaiting urine culture.  Continue to drink more water and keep the bladder flushed.

## 2023-11-08 LAB — URINE CULTURE

## 2023-11-08 NOTE — Progress Notes (Signed)
Call patient: Urine culture is negative no sign of infection from the bladder.

## 2023-11-12 ENCOUNTER — Telehealth: Payer: Self-pay

## 2023-11-12 DIAGNOSIS — R300 Dysuria: Secondary | ICD-10-CM

## 2023-11-12 MED ORDER — PHENAZOPYRIDINE HCL 100 MG PO TABS: 100.0000 mg | ORAL_TABLET | Freq: Three times a day (TID) | ORAL | 0 refills | Status: DC | PRN

## 2023-11-12 NOTE — Telephone Encounter (Signed)
Patient advised.

## 2023-11-12 NOTE — Telephone Encounter (Signed)
We can try pyridium.  It can numb the bladder  Meds ordered this encounter  Medications   phenazopyridine (PYRIDIUM) 100 MG tablet    Sig: Take 1 tablet (100 mg total) by mouth 3 (three) times daily as needed for pain.    Dispense:  30 tablet    Refill:  0

## 2023-11-12 NOTE — Telephone Encounter (Signed)
Copied from CRM [id number]. Topic: Clinical - Medical Advice >> Nov 12, 2023 10:42 AM Nila Nephew wrote: Reason for CRM: Patient states that urethra burns following urination. Patient does not wish to have an appointment and would like to know if PCP will prescribe her something to relieve this until her urology appointment on 02/19.

## 2023-11-14 DIAGNOSIS — R300 Dysuria: Secondary | ICD-10-CM | POA: Diagnosis not present

## 2023-11-24 ENCOUNTER — Other Ambulatory Visit: Payer: Self-pay | Admitting: Family Medicine

## 2023-11-24 DIAGNOSIS — I10 Essential (primary) hypertension: Secondary | ICD-10-CM

## 2023-11-24 DIAGNOSIS — E876 Hypokalemia: Secondary | ICD-10-CM

## 2023-11-28 ENCOUNTER — Other Ambulatory Visit: Payer: Self-pay | Admitting: Family Medicine

## 2023-11-28 DIAGNOSIS — I4891 Unspecified atrial fibrillation: Secondary | ICD-10-CM

## 2023-12-03 ENCOUNTER — Ambulatory Visit: Payer: Self-pay | Admitting: Family Medicine

## 2023-12-03 NOTE — Telephone Encounter (Signed)
 Chief Complaint: Left ankle swelling Symptoms: left ankle swelling Frequency: 3 days Pertinent Negatives: Patient denies fever, redness, pain, bruising Disposition: [] ED /[] Urgent Care (no appt availability in office) / [x] Appointment(In office/virtual)/ []  [place] [facility]/ [] Home Care/ [] Refused Recommended Disposition /[] [place] [facility]/ []  Follow-up with PCP Additional Notes: Patient called reporting left ankle swelling present for 3 days. Patient states she did not have any injury, or any appearance of insect or bite. Patient has been taking BP and eliquis  as prescribed. Patient states she does not have any pain in her ankle, and denies redness, bruising, difficulty walking. Patient denies calf pain, redness in leg. Patient is unsure of cause of swelling. Patient has been using cane for extra security while walking. Patient states this type of ankle swelling has never happened before. Patient appt made for tomorrow for further evaluation.    Copied from CRM [id number]. Topic: Clinical - Red Word Triage >> Dec 03, 2023  9:49 AM Maryln Sober wrote: Red Word that prompted transfer to Nurse Triage: Patient states she noticed swelling around her left ankle yesterday. She states its a little tender and tight. Reason for Disposition  MILD or MODERATE ankle swelling (e.g., can't move joint normally, can't do usual activities) (Exceptions: Itchy, localized swelling; swelling is chronic.)  Answer Assessment - Initial Assessment Questions 1. LOCATION: "Which ankle is swollen?" "Where is the swelling?"     Left ankle 2. ONSET: "When did the swelling start?"     Noticed it yesterday 3. SWELLING: "How bad is the swelling?" Or, "How large is it?" (e.g., mild, moderate, severe; size of localized swelling)    - NONE: No joint swelling.   - LOCALIZED: Localized; small area of puffy or swollen skin (e.g., insect bite, skin irritation).   - MILD: Joint looks or feels mildly swollen or puffy.   -  MODERATE: Swollen; interferes with normal activities (e.g., work or school); decreased range of movement; may be limping.   - SEVERE: Very swollen; can't move swollen joint at all; limping a lot or unable to walk.     Mild 4. PAIN: "Is there any pain?" If Yes, ask: "How bad is it?" (Scale 1-10; or mild, moderate, severe)   - NONE (0): no pain.   - MILD (1-3): doesn't interfere with normal activities.    - MODERATE (4-7): interferes with normal activities (e.g., work or school) or awakens from sleep, limping.    - SEVERE (8-10): excruciating pain, unable to do any normal activities, unable to walk.      No pain 5. CAUSE: "What do you think caused the ankle swelling?"     No 6. OTHER SYMPTOMS: "Do you have any other symptoms?" (e.g., fever, chest pain, difficulty breathing, calf pain)     No  Protocols used: Ankle Swelling-A-AH

## 2023-12-04 ENCOUNTER — Ambulatory Visit: Payer: Medicare HMO | Admitting: Family Medicine

## 2023-12-04 NOTE — Telephone Encounter (Signed)
Appt schld for today was cancelled with note that patient was feeling better/

## 2023-12-26 ENCOUNTER — Encounter: Payer: Self-pay | Admitting: Family Medicine

## 2023-12-26 ENCOUNTER — Ambulatory Visit: Payer: Self-pay | Admitting: Family Medicine

## 2023-12-26 ENCOUNTER — Ambulatory Visit (INDEPENDENT_AMBULATORY_CARE_PROVIDER_SITE_OTHER): Admitting: Family Medicine

## 2023-12-26 VITALS — BP 113/71 | HR 110 | Ht 64.0 in | Wt 153.0 lb

## 2023-12-26 DIAGNOSIS — R300 Dysuria: Secondary | ICD-10-CM

## 2023-12-26 DIAGNOSIS — N3000 Acute cystitis without hematuria: Secondary | ICD-10-CM | POA: Insufficient documentation

## 2023-12-26 LAB — POCT URINALYSIS DIP (CLINITEK)
Blood, UA: NEGATIVE
Glucose, UA: 100 mg/dL — AB
Leukocytes, UA: NEGATIVE
Nitrite, UA: POSITIVE — AB
POC PROTEIN,UA: 30 — AB
Spec Grav, UA: 1.01 (ref 1.010–1.025)
Urobilinogen, UA: 2 U/dL — AB
pH, UA: 5 (ref 5.0–8.0)

## 2023-12-26 MED ORDER — CEPHALEXIN 500 MG PO CAPS
500.0000 mg | ORAL_CAPSULE | Freq: Four times a day (QID) | ORAL | 0 refills | Status: AC
Start: 2023-12-26 — End: 2023-12-31

## 2023-12-26 NOTE — Patient Instructions (Signed)

## 2023-12-26 NOTE — Telephone Encounter (Signed)
  Chief Complaint: burning with void Symptoms: burning post void, increase frequency Frequency: started yesterday  Pertinent Negatives: Patient denies fever, blood in urine, odor, discharge, back/flank pain,  Disposition: [] ED /[] Urgent Care (no appt availability in office) / [x] Appointment(In office/virtual)/ []  [place] [facility]/ [] Home Care/ [] Refused Recommended Disposition /[] [place] [facility]/ []  Follow-up with PCP  Additional Notes: Pt states that she has burning after she completes voiding states it lasts for about an hour. Pt states she is urinating more than normal as well. Denies blood in urine. Pt states hx of UTI. Sched today with another prov in PCP clinic  Copied from CRM [id number]. Topic: Appointments - Appointment Scheduling >> Dec 26, 2023 10:49 AM Priscille Loveless wrote: Possible uti. Can use the bathroom then experience the spasms. Very uncomfortable. Reason for Disposition . Urinating more frequently than usual (i.e., frequency)  Answer Assessment - Initial Assessment Questions 1. SYMPTOM: "What's the main symptom you're concerned about?" (e.g., frequency, incontinence)     burning 2. ONSET: "When did the  burning  start?"     Started last evening 3. PAIN: "Is there any pain?" If Yes, ask: "How bad is it?" (Scale: 1-10; mild, moderate, severe)     2-3 4. CAUSE: "What do you think is causing the symptoms?"     UTI 5. OTHER SYMPTOMS: "Do you have any other symptoms?" (e.g., blood in urine, fever, flank pain, pain with urination)     Burning after voiding,  Protocols used: Urinary Symptoms-A-AH

## 2023-12-26 NOTE — Assessment & Plan Note (Signed)
 No leukocytes on UA but does have nitrites.  Some of this may be skewed by AZO use. Will send for culture and since she is symptomatic go ahead and cover with cephalexin 500mg  qid x5 days.  Weill update her with culture results once these return.

## 2023-12-26 NOTE — Progress Notes (Signed)
 Belinda Owens - 89 y.o. female MRN [id number]  Date of birth: 1932/01/09  Subjective Chief Complaint  Patient presents with   Urinary Tract Infection    HPI Belinda Owens is a 89 y.o. female here today with complaint of dysuria and frequency.  Her symptoms began last night.  She denies flank or back pain.  She has not had fever or chills.  She has not noticed blood in her urine. She has used AZO to help with dysuria.   ROS:  A comprehensive ROS was completed and negative except as noted per HPI  Allergies  Allergen Reactions   Atorvastatin Other (See Comments)    Myalgia   Omeprazole Itching   Tylenol With Codeine #3  [Acetaminophen-Codeine] Nausea And Vomiting   Atrovent Nasal Spray [Ipratropium] Other (See Comments)    burning   Sulfa Antibiotics Dermatitis    Past Medical History:  Diagnosis Date   Fibrocystic breast changes of both breasts    Gets yearly mammo and [place]   History of rheumatic fever    Hypertension     Past Surgical History:  Procedure Laterality Date   BREAST BIOPSY     CARDIOVERSION N/A 03/28/2023   Procedure: CARDIOVERSION;  Surgeon: Chilton Si, MD;  Location: [facility] INVASIVE CV LAB;  Service: Cardiovascular;  Laterality: N/A;   MENISCUS REPAIR Right    RECONSTRUCTION OF EYELID Bilateral 12/14/2016   ROTATOR CUFF REPAIR Right     Social History   Socioeconomic History   Marital status: Divorced    Spouse name: Not on file   Number of children: 3   Years of education: 13   Highest education level: Some college, no degree  Occupational History   Occupation: retired.     Comment: [employer]  Tobacco Use   Smoking status: Former    Current packs/day: 0.00    Types: Cigarettes    Quit date: 10/23/1957    Years since quitting: 66.2   Smokeless tobacco: Never  Vaping Use   Vaping status: Never Used  Substance and Sexual Activity   Alcohol use: Not Currently    Alcohol/week: 0.0 standard drinks of alcohol   Drug use: No   Sexual  activity: Never    Partners: Male    Birth control/protection: None  Other Topics Concern   Not on file  Social History Narrative   Lives alone. Had three children, one has deceased. She enjoys playing scrabble.   Social Drivers of Corporate investment banker Strain: Low Risk  (10/08/2023)   Overall Financial Resource Strain (CARDIA)    Difficulty of Paying Living Expenses: Not hard at all  Food Insecurity: No Food Insecurity (10/08/2023)   Hunger Vital Sign    Worried About Running Out of Food in the Last Year: Never true    Ran Out of Food in the Last Year: Never true  Transportation Needs: No Transportation Needs (10/08/2023)   PRAPARE - Administrator, Civil Service (Medical): No    Lack of Transportation (Non-Medical): No  Physical Activity: Insufficiently Active (10/08/2023)   Exercise Vital Sign    Days of Exercise per Week: 1 day    Minutes of Exercise per Session: 10 min  Stress: No Stress Concern Present (10/08/2023)   [employer] of Occupational Health - Occupational Stress Questionnaire    Feeling of Stress : Not at all  Social Connections: Moderately Integrated (10/08/2023)   Social Connection and Isolation Panel [NHANES]    Frequency of Communication  with Friends and Family: More than three times a week    Frequency of Social Gatherings with Friends and Family: Once a week    Attends Religious Services: More than 4 times per year    Active Member of Clubs or Organizations: Yes    Attends Banker Meetings: 1 to 4 times per year    Marital Status: Divorced    Family History  Problem Relation Age of Onset   Breast cancer Maternal Aunt    Colon cancer Father    Asthma Daughter    Heart disease Mother    Uterine cancer Mother    Cancer - Other Brother     Health Maintenance  Topic Date Due   Zoster Vaccines- Shingrix (1 of 2) Never done   COVID-19 Vaccine (4 - 2024-25 season) 06/24/2023   DTaP/Tdap/Td (2 - Td or Tdap)  03/23/2024   Medicare Annual Wellness (AWV)  04/08/2024   Pneumonia Vaccine 69+ Years old  Completed   INFLUENZA VACCINE  Completed   DEXA SCAN  Completed   HPV VACCINES  Aged Out     ----------------------------------------------------------------------------------------------------------------------------------------------------------------------------------------------------------------- Physical Exam BP 113/71 (BP Location: Left Arm, Patient Position: Sitting, Cuff Size: Normal)   Pulse (!) 110   Ht 5\' 4"  (1.626 m)   Wt 153 lb (69.4 kg)   SpO2 97%   BMI 26.26 kg/m   Physical Exam Constitutional:      Appearance: Normal appearance.  Cardiovascular:     Rate and Rhythm: Normal rate and regular rhythm.  Pulmonary:     Effort: Pulmonary effort is normal.     Breath sounds: Normal breath sounds.  Abdominal:     Tenderness: There is no right CVA tenderness or left CVA tenderness.  Neurological:     Mental Status: She is alert.  Psychiatric:        Mood and Affect: Mood normal.        Behavior: Behavior normal.     ------------------------------------------------------------------------------------------------------------------------------------------------------------------------------------------------------------------- Assessment and Plan  Acute cystitis No leukocytes on UA but does have nitrites.  Some of this may be skewed by AZO use. Will send for culture and since she is symptomatic go ahead and cover with cephalexin 500mg  qid x5 days.  Weill update her with culture results once these return.    Meds ordered this encounter  Medications   cephALEXin (KEFLEX) 500 MG capsule    Sig: Take 1 capsule (500 mg total) by mouth 4 (four) times daily for 5 days.    Dispense:  20 capsule    Refill:  0    No follow-ups on file.    This visit occurred during the SARS-CoV-2 public health emergency.  Safety protocols were in place, including screening questions prior to  the visit, additional usage of staff PPE, and extensive cleaning of exam room while observing appropriate contact time as indicated for disinfecting solutions.

## 2023-12-27 ENCOUNTER — Encounter: Payer: Self-pay | Admitting: Emergency Medicine

## 2023-12-27 ENCOUNTER — Telehealth: Payer: Self-pay

## 2023-12-27 ENCOUNTER — Ambulatory Visit: Admission: EM | Admit: 2023-12-27 | Discharge: 2023-12-27 | Disposition: A | Discharge status: Home / Self Care

## 2023-12-27 ENCOUNTER — Ambulatory Visit: Payer: Self-pay | Admitting: Family Medicine

## 2023-12-27 DIAGNOSIS — N3000 Acute cystitis without hematuria: Secondary | ICD-10-CM

## 2023-12-27 DIAGNOSIS — R300 Dysuria: Secondary | ICD-10-CM | POA: Diagnosis not present

## 2023-12-27 MED ORDER — NITROFURANTOIN MONOHYD MACRO 100 MG PO CAPS: 100.0000 mg | ORAL_CAPSULE | Freq: Two times a day (BID) | ORAL | 0 refills | Status: DC

## 2023-12-27 NOTE — Telephone Encounter (Signed)
 Patient called again  Regarding abdominal pain and cipro Already sent as a ARM per call representative.

## 2023-12-27 NOTE — Telephone Encounter (Signed)
 Patient has called in 3 times today. Called CAL before reaching out to patient, as patient has spoken to nurse triage twice today. Spoke with RN at clinic who is forwarding a message to the provider for further guidance to advise patient.

## 2023-12-27 NOTE — Telephone Encounter (Signed)
 Chief Complaint: UTI Symptoms: Burning urethra, abdominal cramp (started last night/this morning) Frequency: Intermittent Disposition: [] ED /[] Urgent Care (no appt availability in office) / [] Appointment(In office/virtual)/ []  [facility]/ [] Home Care/ [] Refused Recommended Disposition /[] [facility]/ []  Follow-up with PCP Additional Notes: Pt states her urethra is burning, even without urinating. Pt states the pain will start after urinating and will last for hours. Pt states this started yesterday. Pt states she was prescribed cephalexin yesterday but it is not helping. This RN educated pt that it can take a couple days for an antibiotic to work. This RN educated pt on home care, new-worsening symptoms, when to call back/seek emergent care. Pt verbalized understanding and agrees to plan.    Copied from CRM [id number]. Topic: Clinical - Medication Question >> Dec 27, 2023  3:07 PM Nila Nephew wrote: Reason for CRM: Patient is calling in once again regarding her issue. Patient is refusing an appointment. Patient wants medical advisement now. Reason for Disposition . [1] MILD-MODERATE pain AND [2] constant and [3] present < 2 hours  Protocols used: Abdominal Pain - Female-A-AH

## 2023-12-27 NOTE — ED Triage Notes (Signed)
 Patient c/o dysuria, urinary frequency, denies any hematuria since yesterday.  Patient has been taken AZO and Keflex by Dr Ashley Royalty on yesterday.  Patient states that they performed a urinalysis yesterday.

## 2023-12-27 NOTE — Telephone Encounter (Signed)
 Patient informed and will switch antibiotics.

## 2023-12-27 NOTE — Telephone Encounter (Signed)
 Update: pt called back. Pt has taken 3 doses so far. RN explained antibiotics can upset the stomach. RN advised pt to keep taking medication as prescribed until PCP see previous assessment. RN encouraged pt to rest today and drink plenty of water. Pt verbalized understanding.  Copied from CRM [id number]. Topic: Clinical - Medication Question >> Dec 27, 2023 11:07 AM Nila Nephew wrote: Reason for CRM: Patient is seeking urgent medical advisement on if she should cease taking her cipro, as it has been accompanied by stomach pain.

## 2023-12-27 NOTE — Telephone Encounter (Signed)
 Note already open in this regard will close this one.

## 2023-12-27 NOTE — Telephone Encounter (Signed)
  Chief Complaint: stomach cramping/pain Symptoms: cramp/pain Frequency: comes and goes   Disposition: [] ED /[] Urgent Care (no appt availability in office) / [] Appointment(In office/virtual)/ []  [facility]/ [] Home Care/ [] Refused Recommended Disposition /[] [facility]/ [x]  Follow-up with PCP Additional Notes: PT calling with complaints of stomach cramps and pain since starting antibiotic Ciprofloxcin last night for UTI. Pt states she doesn't if she can continue it. Pt did take next dose this morning.  Pt is asking if she needs to swap medication. Pt denies any other symptoms. Please advise and call pt to update. RN gave care advise and pt verbalized understanding.                Copied from CRM [id number]. Topic: Clinical - Red Word Triage >> Dec 27, 2023 10:41 AM Nila Nephew wrote: Red Word that prompted transfer to Nurse Triage: Patient prescribed ciprofloxacin due to recent UTI appointment. Patient states it's causing significant pain in her stomach, states similar to a bad menstrual cramp. Reason for Disposition  [1] SEVERE pain AND [2] present < 1 hour  Answer Assessment - Initial Assessment Questions 1. LOCATION: "Where does it hurt?"      Above public area and to left side  2. RADIATION: "Does the pain shoot anywhere else?" (e.g., chest, back)     Denies  3. ONSET: "When did the pain begin?" (e.g., minutes, hours or days ago)      Last night  4. SUDDEN: "Gradual or sudden onset?"     Gradual  5. PATTERN "Does the pain come and go, or is it constant?"    - If it comes and goes: "How long does it last?" "Do you have pain now?"     (Note: Comes and goes means the pain is intermittent. It goes away completely between bouts.)    - If constant: "Is it getting better, staying the same, or getting worse?"      (Note: Constant means the pain never goes away completely; most serious pain is constant and gets worse.)      Comes and goes  6. SEVERITY: "How bad is  the pain?"  (e.g., Scale 1-10; mild, moderate, or severe)    - MILD (1-3): Doesn't interfere with normal activities, abdomen soft and not tender to touch.     - MODERATE (4-7): Interferes with normal activities or awakens from sleep, abdomen tender to touch.     - SEVERE (8-10): Excruciating pain, doubled over, unable to do any normal activities.       Mild  7. RECURRENT SYMPTOM: "Have you ever had this type of stomach pain before?" If Yes, ask: "When was the last time?" and "What happened that time?"      na 8. CAUSE: "What do you think is causing the stomach pain?"     Started Cipro last night  9. RELIEVING/AGGRAVATING FACTORS: "What makes it better or worse?" (e.g., antacids, bending or twisting motion, bowel movement)     Can't tell  10. OTHER SYMPTOMS: "Do you have any other symptoms?" (e.g., back pain, diarrhea, fever, urination pain, vomiting)       Denies  Protocols used: Abdominal Pain - Female-A-AH

## 2023-12-27 NOTE — ED Provider Notes (Signed)
 [facility]    CSN: [id number] Arrival date & time: 12/27/23  1600      History   Chief Complaint Chief Complaint  Patient presents with   Dysuria    HPI Belinda Owens is a 89 y.o. female.   Patient presents to clinic over concerns of dysuria, urinary frequency and suprapubic pressure.  She was seen yesterday at this [facility] and diagnosed with a urinary tract infection.  She was placed on Keflex.  She had 2 doses last night and has had 1 dose so far today of this antibiotic.  She is not really sure if she has had any Azo today.  She may have taken Tylenol.  Patient did have a urinalysis yesterday and her urine was sent off for culture, this has not resulted yet.  Denies fevers, denies flank pain, denies nausea or vomiting.  The history is provided by the patient and medical records.  Dysuria   Past Medical History:  Diagnosis Date   Fibrocystic breast changes of both breasts    Gets yearly mammo and [place]   History of rheumatic fever    Hypertension     Patient Active Problem List   Diagnosis Date Noted   Acute cystitis 12/26/2023   Iron deficiency 09/19/2023   Acute frontal sinusitis 08/13/2023   Greater trochanteric bursitis, left 06/07/2023   Atrial fibrillation (HCC) 03/05/2023   Benign positional vertigo 02/26/2023   Trigger thumb, right thumb 01/12/2023   IFG (impaired fasting glucose) 03/13/2022   History of TIA (transient ischemic attack) 01/10/2021   Primary osteoarthritis of right wrist 10/02/2018   De Quervain's tenosynovitis, right 10/02/2018   CKD (chronic kidney disease) stage 2, GFR 60-89 ml/min 08/18/2018   Myalgia due to statin 02/19/2018   Adenoma of duodenum 04/10/2017   Atypical ductal hyperplasia of left breast 12/01/2016   Sedentary lifestyle 12/01/2016   Class 1 drug-induced obesity without serious comorbidity with body mass index (BMI) of 31.0 to 31.9 in adult 12/01/2016   At risk for breast cancer 12/01/2016    Primary osteoarthritis of left knee 06/08/2016   Osteopenia 09/29/2015   Ear ringing sound 04/27/2015   Bilateral shoulder pain 03/16/2015   Degenerative disc disease, cervical 03/16/2015   Insomnia 03/09/2015   Hyperlipidemia 02/03/2015   Essential hypertension 02/03/2015   DDD (degenerative disc disease), lumbar 02/03/2015   GERD (gastroesophageal reflux disease) 02/03/2015   Anxiety state 02/03/2015   Fibrocystic breast changes of both breasts 02/03/2015   Microscopic hematuria 02/03/2015    Past Surgical History:  Procedure Laterality Date   BREAST BIOPSY     CARDIOVERSION N/A 03/28/2023   Procedure: CARDIOVERSION;  Surgeon: Chilton Si, MD;  Location: [facility] INVASIVE CV LAB;  Service: Cardiovascular;  Laterality: N/A;   MENISCUS REPAIR Right    RECONSTRUCTION OF EYELID Bilateral 12/14/2016   ROTATOR CUFF REPAIR Right     OB History     Gravida  3   Para  3   Term  3   Preterm      AB      Living         SAB      IAB      Ectopic      Multiple      Live Births  3            Home Medications    Prior to Admission medications   Medication Sig Start Date End Date Taking? Authorizing Provider  amLODipine (NORVASC)  5 MG tablet TAKE 1 TABLET BY MOUTH DAILY 01/15/23  Yes Agapito Games, MD  cephALEXin (KEFLEX) 500 MG capsule Take 1 capsule (500 mg total) by mouth 4 (four) times daily for 5 days. 12/26/23 12/31/23 Yes Everrett Coombe, DO  conjugated estrogens (PREMARIN) vaginal cream Apply pea sized amount nightly to the urethra and outer vaginal area nightly for 2 weeks and then 3 x a week. 08/22/23  Yes Agapito Games, MD  cyanocobalamin 1000 MCG tablet Take 1,000 mcg by mouth daily.   Yes [provider]  ELIQUIS 2.5 MG TABS tablet TAKE 1 TABLET BY MOUTH 2 TIMES A DAY 11/28/23  Yes Agapito Games, MD  fluconazole (DIFLUCAN) 150 MG tablet Take 1 tablet (150 mg total) by mouth every 3 (three) days. For three doses 09/27/23  Yes  Milas Hock, MD  fluticasone [facility]) 50 MCG/ACT nasal spray Place 1-2 sprays into both nostrils daily. For nasal congestion Patient taking differently: Place 1-2 sprays into both nostrils daily as needed (congestion.). 12/09/21  Yes Agapito Games, MD  losartan-hydrochlorothiazide (HYZAAR) 100-12.5 MG tablet Take 1 tablet by mouth daily. 06/21/23  Yes Agapito Games, MD  metoprolol succinate (TOPROL-XL) 50 MG 24 hr tablet Take 1 tablet (50 mg total) by mouth daily. 06/07/23  Yes Monica Becton, MD  Multiple Vitamin (MULTIVITAMIN WITH MINERALS) TABS tablet Take 1 tablet by mouth in the morning. Centrum Silver for Women   Yes [provider]  phenazopyridine (PYRIDIUM) 100 MG tablet Take 1 tablet (100 mg total) by mouth 3 (three) times daily as needed for pain. 11/12/23  Yes Agapito Games, MD  pravastatin (PRAVACHOL) 20 MG tablet TAKE 1 TABLET BY MOUTH AT BEDTIME 08/01/23  Yes Agapito Games, MD  sertraline (ZOLOFT) 25 MG tablet TAKE 1 TABLET BY MOUTH DAILY 11/26/23  Yes Agapito Games, MD  vitamin D3 (CHOLECALCIFEROL) 25 MCG tablet Take 1,000 Units by mouth in the morning.   Yes [provider]  zinc sulfate 220 (50 Zn) MG capsule Take 220 mg by mouth in the morning.   Yes [provider]  zolpidem (AMBIEN) 5 MG tablet TAKE 1/2 TO 1 TABLET BY MOUTH EVERY NIGHT AT BEDTIME AS NEEDED FOR SLEEP 09/07/23  Yes Agapito Games, MD    Family History Family History  Problem Relation Age of Onset   Breast cancer Maternal Aunt    Colon cancer Father    Asthma Daughter    Heart disease Mother    Uterine cancer Mother    Cancer - Other Brother     Social History Social History   Tobacco Use   Smoking status: Former    Current packs/day: 0.00    Types: Cigarettes    Quit date: 10/23/1957    Years since quitting: 66.2   Smokeless tobacco: Never  Vaping Use   Vaping status: Never Used  Substance Use Topics   Alcohol use: Not  Currently    Alcohol/week: 0.0 standard drinks of alcohol   Drug use: No     Allergies   Atorvastatin, Omeprazole, Tylenol with codeine #3  [acetaminophen-codeine], Atrovent nasal spray [ipratropium], and Sulfa antibiotics   Review of Systems Review of Systems  Per HPI   Physical Exam Triage Vital Signs ED Triage Vitals  Encounter Vitals Group     BP 12/27/23 1636 137/87     Systolic BP Percentile --      Diastolic BP Percentile --      Pulse Rate 12/27/23  1636 (!) 101     Resp 12/27/23 1636 18     Temp 12/27/23 1636 98 F (36.7 C)     Temp Source 12/27/23 1636 Oral     SpO2 12/27/23 1636 94 %     Weight --      Height --      Head Circumference --      Peak Flow --      Pain Score 12/27/23 1637 6     Pain Loc --      Pain Education --      Exclude from Growth Chart --    No data found.  Updated Vital Signs BP 137/87 (BP Location: Right Arm)   Pulse (!) 101   Temp 98 F (36.7 C) (Oral)   Resp 18   SpO2 94%   Visual Acuity Right Eye Distance:   Left Eye Distance:   Bilateral Distance:    Right Eye Near:   Left Eye Near:    Bilateral Near:     Physical Exam Vitals and nursing note reviewed.  Constitutional:      Appearance: Normal appearance.  HENT:     Head: Normocephalic and atraumatic.     Right Ear: External ear normal.     Left Ear: External ear normal.     Nose: Nose normal.     Mouth/Throat:     Mouth: Mucous membranes are moist.  Eyes:     Conjunctiva/sclera: Conjunctivae normal.  Cardiovascular:     Rate and Rhythm: Normal rate and regular rhythm.     Heart sounds: Normal heart sounds. No murmur heard. Abdominal:     Tenderness: There is no right CVA tenderness or left CVA tenderness.  Skin:    General: Skin is warm and dry.  Neurological:     General: No focal deficit present.     Mental Status: She is alert.  Psychiatric:        Mood and Affect: Mood normal.      UC Treatments / Results  Labs (all labs ordered are  listed, but only abnormal results are displayed) Labs Reviewed - No data to display  EKG   Radiology No results found.  Procedures Procedures (including critical care time)  Medications Ordered in UC Medications - No data to display  Initial Impression / Assessment and Plan / UC Course  I have reviewed the triage vital signs and the nursing notes.  Pertinent labs & imaging results that were available during my care of the patient were reviewed by me and considered in my medical decision making (see chart for details).  Vitals and triage reviewed, patient is hemodynamically stable.  Negative for CVA tenderness.  Afebrile.  Without vomiting or nausea, low concern for pyelonephritis.  Urine sample collected yesterday was concerning for UTI, advised continuation of Keflex as prescribed.  Culture is pending.  Strict emergency and follow-up precautions given if symptoms do not improve over the next few days on antibiotics.  Patient verbalized understanding, no questions at this time.     Final Clinical Impressions(s) / UC Diagnoses   Final diagnoses:  Dysuria  Acute cystitis without hematuria     Discharge Instructions      Continue taking your Keflex 4 times daily as prescribed.  You can take it with food to help prevent stomach upset.  You can take the AZO up to 3 times daily to help with the burning sensation.  Ensure you are drinking at least 64 ounces of water  and you can take 500 mg of Tylenol every 6 or 8 hours for any pain.  If you do not have any improvement in your symptoms by Saturday, you develop a fever, vomiting, or low back pain please return to clinic or seek immediate care at the nearest emergency department.    ED Prescriptions   None    PDMP not reviewed this encounter.   Kiyana Vazguez, [place], [place] 12/27/23 [phone]

## 2023-12-27 NOTE — Telephone Encounter (Signed)
 Per reords she is taking Keflex, not Ciipro. Looks like went to UC today.  If need to switch can change to Macrobid.

## 2023-12-27 NOTE — Discharge Instructions (Signed)
 Continue taking your Keflex 4 times daily as prescribed.  You can take it with food to help prevent stomach upset.  You can take the AZO up to 3 times daily to help with the burning sensation.  Ensure you are drinking at least 64 ounces of water and you can take 500 mg of Tylenol every 6 or 8 hours for any pain.  If you do not have any improvement in your symptoms by Saturday, you develop a fever, vomiting, or low back pain please return to clinic or seek immediate care at the nearest emergency department.

## 2023-12-28 ENCOUNTER — Telehealth: Payer: Self-pay

## 2023-12-28 LAB — URINE CULTURE

## 2023-12-28 NOTE — Telephone Encounter (Signed)
 Called  [facility] informed that abx was to be changed due to abdominal pain with keflex use.

## 2023-12-28 NOTE — Telephone Encounter (Signed)
 Copied from CRM [id number]. Topic: General - Other >> Dec 27, 2023  5:32 PM Emylou G wrote: Reason for CRM: [facility] [phone] called - they advised patient picked up cephALEXin (KEFLEX) 500 MG capsule but they are now being prescribed nitrofurantoin, macrocrystal-monohydrate, (MACROBID) 100 MG capsule.. Can she take both?  Or is she to stop cephALEXin (KEFLEX) 500 MG capsule?  Please contact back Pharmacy so she can process fill.

## 2024-01-04 ENCOUNTER — Telehealth: Payer: Self-pay

## 2024-01-04 NOTE — Telephone Encounter (Signed)
 Patient states she had a missed call from our office. I explained we do not have a record of a call.

## 2024-01-04 NOTE — Telephone Encounter (Signed)
 Copied from CRM [id number]. Topic: Clinical - Medication Question >> Jan 03, 2024  2:46 PM Nyra Capes wrote: Reason for CRM: Patient received a message, and is inquiring about message. There no information on the message and would like a call back phone# [phone] okay to leave a detailed message. She would really like to talk to the nurse.

## 2024-01-21 ENCOUNTER — Ambulatory Visit: Payer: Self-pay | Admitting: *Deleted

## 2024-01-21 NOTE — Telephone Encounter (Signed)
 Copied from CRM [id number]. Topic: Clinical - Red Word Triage >> Jan 21, 2024  3:22 PM Suzette B wrote: Reason for Triage: burning while urinating bubbles in urine discomfort and pain, burning does not want to stop and would like to try and get some antibiotics. Reason for Disposition  Age > 50 years  Answer Assessment - Initial Assessment Questions 1. SEVERITY: "How bad is the pain?"  (e.g., Scale 1-10; mild, moderate, or severe)   - MILD (1-3): complains slightly about urination hurting   - MODERATE (4-7): interferes with normal activities     - SEVERE (8-10): excruciating, unwilling or unable to urinate because of the pain      It's burning when I urinate and when I'm not urinating.  Has bubblies and frothy urine. It's been there for a couple days. 2. FREQUENCY: "How many times have you had painful urination today?"      Every time 3. PATTERN: "Is pain present every time you urinate or just sometimes?"      Yes   No blood 4. ONSET: "When did the painful urination start?"      Couple of days now 5. FEVER: "Do you have a fever?" If Yes, ask: "What is your temperature, how was it measured, and when did it start?"     No fever My stomach is uncomfortable above my pelvic area 6. PAST UTI: "Have you had a urine infection before?" If Yes, ask: "When was the last time?" and "What happened that time?"      No   7. CAUSE: "What do you think is causing the painful urination?"  (e.g., UTI, scratch, Herpes sore)     UTI 8. OTHER SYMPTOMS: "Do you have any other symptoms?" (e.g., blood in urine, flank pain, genital sores, urgency, vaginal discharge)     No flank pain 9. PREGNANCY: "Is there any chance you are pregnant?" "When was your last menstrual period?"     N/A due age  Protocols used: Urination Pain - Female-A-AH  Chief Complaint: Burning with urination and constant burning in genital area when not urinating. Symptoms: frothy urine. Frequency: Urgency.    Pertinent Negatives: Patient  denies blood in urine or flank pain Disposition: [] ED /[] Urgent Care (no appt availability in office) / [x] Appointment(In office/virtual)/ []  [facility] [facility]/ [] Home Care/ [] Refused Recommended Disposition /[] [place] [facility]/ []  Follow-up with PCP Additional Notes: Appt made for 01/22/2024

## 2024-01-22 ENCOUNTER — Ambulatory Visit: Admitting: Physician Assistant

## 2024-01-23 ENCOUNTER — Ambulatory Visit (INDEPENDENT_AMBULATORY_CARE_PROVIDER_SITE_OTHER): Admitting: Family Medicine

## 2024-01-23 ENCOUNTER — Encounter: Payer: Self-pay | Admitting: Family Medicine

## 2024-01-23 VITALS — BP 119/80 | HR 93 | Ht 64.0 in | Wt 150.2 lb

## 2024-01-23 DIAGNOSIS — R300 Dysuria: Secondary | ICD-10-CM | POA: Diagnosis not present

## 2024-01-23 LAB — POCT URINALYSIS DIP (CLINITEK)
Bilirubin, UA: NEGATIVE
Glucose, UA: NEGATIVE mg/dL
Ketones, POC UA: NEGATIVE mg/dL
Nitrite, UA: NEGATIVE
POC PROTEIN,UA: NEGATIVE
Spec Grav, UA: 1.01 (ref 1.010–1.025)
Urobilinogen, UA: 0.2 U/dL
pH, UA: 6 (ref 5.0–8.0)

## 2024-01-23 MED ORDER — NITROFURANTOIN MONOHYD MACRO 100 MG PO CAPS: 100.0000 mg | ORAL_CAPSULE | Freq: Two times a day (BID) | ORAL | 0 refills | Status: DC

## 2024-01-23 NOTE — Progress Notes (Signed)
 Acute Office Visit  Subjective:     Patient ID: Belinda Owens, female    DOB: 1932/05/15, 89 y.o.   MRN: [id number]  Chief Complaint  Patient presents with   Urinary Tract Infection    Burning, abnormal color    HPI Patient is in today for burning with urination and frequency for about 3 days.  No fevers chills sweats or back pain.  No hematuria.  Last treatment for UTI was about 4 weeks ago with Macrobid.  Her urine culture came back negative at that visit.  He also had a urine culture in January for similar symptoms and that culture was negative as well.  Had a bad ankle sprain years ago and now gets more chronic swelling in her left ankle. She keep s ti elevated at home.   ROS      Objective:    BP 119/80 (BP Location: Left Arm, Patient Position: Sitting, Cuff Size: Large)   Pulse 93   Ht 5\' 4"  (1.626 m)   Wt 150 lb 4 oz (68.2 kg)   SpO2 95%   BMI 25.79 kg/m    Physical Exam Vitals reviewed.  Constitutional:      Appearance: Normal appearance.  HENT:     Head: Normocephalic.  Pulmonary:     Effort: Pulmonary effort is normal.  Musculoskeletal:     Comments: Lateral left ankle swelling   Neurological:     Mental Status: She is alert and oriented to person, place, and time.  Psychiatric:        Mood and Affect: Mood normal.        Behavior: Behavior normal.     Results for orders placed or performed in visit on 01/23/24  POCT URINALYSIS DIP (CLINITEK)  Result Value Ref Range   Color, UA yellow yellow   Clarity, UA clear clear   Glucose, UA negative negative mg/dL   Bilirubin, UA negative negative   Ketones, POC UA negative negative mg/dL   Spec Grav, UA 2.130 8.657 - 1.025   Blood, UA trace-intact (A) negative   pH, UA 6.0 5.0 - 8.0   POC PROTEIN,UA negative negative, trace   Urobilinogen, UA 0.2 0.2 or 1.0 E.U./dL   Nitrite, UA Negative Negative   Leukocytes, UA Trace (A) Negative        Assessment & Plan:   Problem List Items Addressed  This Visit   None Visit Diagnoses       Burning with urination    -  Primary   Relevant Orders   POCT URINALYSIS DIP (CLINITEK) (Completed)   Urine Culture     Dysuria       Relevant Orders   POCT URINALYSIS DIP (CLINITEK) (Completed)   Urine Culture      Will sent culture. If neg this will be 3rd neg culture in a row. Has an appt in May with Urology.  Consider interstitial cystitis. Urology to evaluate.   Gave reassurance that all persistent swelling from a prior injury is not unusual.  She is not having any pain or difficulty with that ankle currently  Gave reassurrance   Meds ordered this encounter  Medications   nitrofurantoin, macrocrystal-monohydrate, (MACROBID) 100 MG capsule    Sig: Take 1 capsule (100 mg total) by mouth 2 (two) times daily.    Dispense:  10 capsule    Refill:  0    Return in about 6 weeks (around 03/05/2024), or if symptoms worsen or fail to improve, for medications  and mood .  Nani Gasser, MD

## 2024-01-24 ENCOUNTER — Other Ambulatory Visit: Payer: Self-pay | Admitting: Family Medicine

## 2024-01-24 DIAGNOSIS — E785 Hyperlipidemia, unspecified: Secondary | ICD-10-CM

## 2024-01-25 ENCOUNTER — Ambulatory Visit: Payer: Self-pay

## 2024-01-25 LAB — URINE CULTURE

## 2024-01-25 NOTE — Telephone Encounter (Signed)
 Copied from CRM [id number]. Topic: Clinical - Red Word Triage >> Jan 25, 2024  3:28 PM Brittney F wrote: [facility] that prompted transfer to Nurse Triage: burning sensation when urinating; foul odor from urine; urine is sometimes bubbly   Chief Complaint: Urinary Symptoms Symptoms: frequency, burning with urination Frequency: x a few days Pertinent Negatives: Patient denies fever, nausea, vomiting, diarrhea, blood in urine, flank pain Disposition: [] ED /[] Urgent Care (no appt availability in office) / [] Appointment(In office/virtual)/ []  [place] [facility]/ [] Home Care/ [] Refused Recommended Disposition /[] [place] [facility]/ [x]  Follow-up with PCP Additional Notes: Patient called and advised that her symptoms of burning with urination are still present and she is having urinary frequency. Patient was seen in the office two days ago for the same symptoms Patient is advised of the note in her chart from her visit from Dr Linford Arnold stating: "Please call patient and let her know that the urine culture came back negative so please stop the antibiotic. I definitely want her to follow-up with the urologist about this since this is the third time that she has come in for urinary symptoms and it has not been a urinary tract infection. "  Patient states she knew her culture was negative but didn't remember if anyone told her about stopping the antibiotic.  Patient wanted some advice/direction from her PCP since she is still experiencing these symptoms with the burning and her appointment with urology is a month away.  For the best patient care at this time--This RN called the CAL at the patient's PCP office for further advice for this patient at this time.  Dr Linford Arnold advised staff to advise this RN to tell the patient to use AZO over the counter for her symptoms.  She is advised also that if she is still concerned at any point she can call us back. Patient is appreciative of this advice and states she  will do this. Care Advice given to patient as per protocol. Patient is also advised that if anything worsens over the weekend to either go to an Urgent Care or go to the Emergency Room for further evaluation.  Patient verbalized understanding.  Reason for Disposition  Age > 50 years  Answer Assessment - Initial Assessment Questions 1. SEVERITY: "How bad is the pain?"  (e.g., Scale 1-10; mild, moderate, or severe)   - MILD (1-3): complains slightly about urination hurting   - MODERATE (4-7): interferes with normal activities     - SEVERE (8-10): excruciating, unwilling or unable to urinate because of the pain      4 2. FREQUENCY: "How many times have you had painful urination today?"      3 3. PATTERN: "Is pain present every time you urinate or just sometimes?"      "Every time and it lasts for hours" 4. ONSET: "When did the painful urination start?"      A few days 5. FEVER: "Do you have a fever?" If Yes, ask: "What is your temperature, how was it measured, and when did it start?"     No 6. PAST UTI: "Have you had a urine infection before?" If Yes, ask: "When was the last time?" and "What happened that time?"      yes 7. CAUSE: "What do you think is causing the painful urination?"  (e.g., UTI, scratch, Herpes sore)     unsure 8. OTHER SYMPTOMS: "Do you have any other symptoms?" (e.g., blood in urine, flank pain, genital sores, urgency, vaginal discharge)  Frequency  Protocols used: Urination Pain - Female-A-AH

## 2024-01-25 NOTE — Progress Notes (Signed)
 Please call patient and let her know that the urine culture came back negative so please stop the antibiotic.  I definitely want her to follow-up with the urologist about this since this is the third time that she has come in for urinary symptoms and it has not been a urinary tract infection.

## 2024-01-25 NOTE — Telephone Encounter (Signed)
 I would recommend switching to Azo for symptom relief.  This is her third episode of urinary symptoms with a negative culture she does already have an upcoming appointment with urology so at this point I think just using Azo and lots of fluids for comfort would be most helpful.

## 2024-01-28 ENCOUNTER — Telehealth: Payer: Self-pay

## 2024-01-28 NOTE — Telephone Encounter (Signed)
 Copied from CRM [id number]. Topic: Clinical - Red Word Triage >> Jan 25, 2024  3:28 PM Philippa Chester F wrote: Red Word that prompted transfer to Nurse Triage: burning sensation when urinating; foul odor from urine; urine is sometimes bubbly >> Jan 28, 2024  2:45 PM Corin V wrote: Patient returned missed call. Relayed Dr. Shelah Lewandowsky message. She stated she already has Azo and will continue taking it. No questions at this time.

## 2024-01-28 NOTE — Telephone Encounter (Signed)
 Attempted call to patient. Left a detailed voice mail message on patient home # ( allowed on DPR )

## 2024-02-06 ENCOUNTER — Encounter: Payer: Self-pay | Admitting: Family Medicine

## 2024-02-06 ENCOUNTER — Ambulatory Visit (INDEPENDENT_AMBULATORY_CARE_PROVIDER_SITE_OTHER): Admitting: Family Medicine

## 2024-02-06 VITALS — BP 119/82 | HR 93 | Ht 64.0 in | Wt 151.0 lb

## 2024-02-06 DIAGNOSIS — R300 Dysuria: Secondary | ICD-10-CM

## 2024-02-06 LAB — POCT URINALYSIS DIP (CLINITEK)
Bilirubin, UA: NEGATIVE
Glucose, UA: NEGATIVE mg/dL
Ketones, POC UA: NEGATIVE mg/dL
Nitrite, UA: NEGATIVE
POC PROTEIN,UA: 30 — AB
Spec Grav, UA: 1.015 (ref 1.010–1.025)
Urobilinogen, UA: 0.2 U/dL
pH, UA: 5.5 (ref 5.0–8.0)

## 2024-02-06 NOTE — Progress Notes (Signed)
   Acute Office Visit  Subjective:     Patient ID: Belinda Owens, female    DOB: Sep 23, 1932, 89 y.o.   MRN: [id number]  Chief Complaint  Patient presents with   Urinary Tract Infection    HPI Patient is in today for discomfort in the urethra about 15 minutes after she urinates and then it can last for hours.  She describes it as a burning pain.  She is coming several times with urethritis and we have done urine samples and cultures and they have frequently come back negative.  We have referred her to urology for further workup but her appointment is not until the end of May.    ROS      Objective:    BP 119/82   Pulse 93   Ht 5\' 4"  (1.626 m)   Wt 151 lb (68.5 kg)   SpO2 96%   BMI 25.92 kg/m    Physical Exam Vitals and nursing note reviewed.  Constitutional:      Appearance: Normal appearance.  HENT:     Head: Normocephalic and atraumatic.  Eyes:     Conjunctiva/sclera: Conjunctivae normal.  Cardiovascular:     Rate and Rhythm: Normal rate and regular rhythm.  Pulmonary:     Effort: Pulmonary effort is normal.     Breath sounds: Normal breath sounds.  Skin:    General: Skin is warm and dry.  Neurological:     Mental Status: She is alert and oriented to person, place, and time.  Psychiatric:        Mood and Affect: Mood normal.        Behavior: Behavior normal.     Results for orders placed or performed in visit on 02/06/24  POCT URINALYSIS DIP (CLINITEK)  Result Value Ref Range   Color, UA yellow yellow   Clarity, UA clear clear   Glucose, UA negative negative mg/dL   Bilirubin, UA negative negative   Ketones, POC UA negative negative mg/dL   Spec Grav, UA 3.086 5.784 - 1.025   Blood, UA trace-lysed (A) negative   pH, UA 5.5 5.0 - 8.0   POC PROTEIN,UA =30 (A) negative, trace   Urobilinogen, UA 0.2 0.2 or 1.0 E.U./dL   Nitrite, UA Negative Negative   Leukocytes, UA Small (1+) (A) Negative        Assessment & Plan:   Problem List Items Addressed  This Visit   None Visit Diagnoses       Burning with urination    -  Primary   Relevant Orders   POCT URINALYSIS DIP (CLINITEK) (Completed)   Urine Culture       Check for acute UTI the last urine cultures have been negative.  I am not going to put her on anything prophylactically going to wait till it comes back in the meantime we did discuss restarting her topical steroid and then also a trial of lidocaine cream or gel for symptom relief at least until she is able to get in with urology next month also encouraged her to call and see if she can get on a cancellation list.   No orders of the defined types were placed in this encounter.   Return if symptoms worsen or fail to improve.  Nani Gasser, MD

## 2024-02-06 NOTE — Patient Instructions (Signed)
 Please restart the estrogen cream just apply a pea-sized amount to the urethra area 3 times a week for example Monday Wednesday and Friday.  When you get pain you can apply lidocaine cream or gel to numb the area and see if that is helpful.  It should give you a couple hours of at least reduced pain and relief.  And then you can reapply as needed.  You to call the urologist and try to get on a wait/cancellation list.

## 2024-02-08 ENCOUNTER — Encounter: Payer: Self-pay | Admitting: Family Medicine

## 2024-02-08 LAB — URINE CULTURE

## 2024-02-08 NOTE — Progress Notes (Signed)
 Hi Oriana, no UTI.  I hope the numbing medicine is helping.

## 2024-02-18 ENCOUNTER — Ambulatory Visit: Payer: Self-pay

## 2024-02-18 NOTE — Telephone Encounter (Signed)
 Chief Complaint: dysuria Symptoms: dysuria, frequency Frequency: 3-4 days Pertinent Negatives: Patient denies fever, N/V, back pain, abd pain, hematuria, flank pain Disposition: [] ED /[] Urgent Care (no appt availability in office) / [x] Appointment(In office/virtual)/ []  [place] [facility]/ [] Home Care/ [] Refused Recommended Disposition /[] [place] [facility]/ []  Follow-up with PCP Additional Notes: Frequency and 6/10 burning after urination for 3-4 days. Denies all other symptoms. No availability in the office until tomorrow. RN advised pt she can go to UC today for sooner treatment. Pt states she prefers to be seen in office. RN scheduled pt for tomorrow at 1130. RN advised pt if she worsens she should give us  a call back. Pt verbalized understanding.    Copied from CRM [id number]. Topic: Clinical - Red Word Triage >> Feb 18, 2024  9:27 AM Retta Caster wrote: Red Word that prompted transfer to Nurse Triage: Patient UTI issue Symptoms: 3-4 days-Urinating a lot/Burns after finished for long time Reason for Disposition  Urinating more frequently than usual (i.e., frequency)  Answer Assessment - Initial Assessment Questions 1. SYMPTOM: "What's the main symptom you're concerned about?" (e.g., frequency, incontinence)     Dysuria and frequency 2. ONSET: "When did the  start?"     3-4 days 3. PAIN: "Is there any pain?" If Yes, ask: "How bad is it?" (Scale: 1-10; mild, moderate, severe)     6-7/10 4. CAUSE: "What do you think is causing the symptoms?"     Poss UTI 5. OTHER SYMPTOMS: "Do you have any other symptoms?" (e.g., blood in urine, fever, flank pain, pain with urination)     No flank pain, no back pain, no fever or chills, no hematuria, no N/V. Endorses "bubbles the urine"  Protocols used: Urinary Symptoms-A-AH

## 2024-02-19 ENCOUNTER — Ambulatory Visit (INDEPENDENT_AMBULATORY_CARE_PROVIDER_SITE_OTHER): Admitting: Family Medicine

## 2024-02-19 ENCOUNTER — Encounter: Payer: Self-pay | Admitting: Family Medicine

## 2024-02-19 VITALS — BP 130/74 | HR 82 | Temp 98.4°F | Ht 64.0 in | Wt 155.1 lb

## 2024-02-19 DIAGNOSIS — R399 Unspecified symptoms and signs involving the genitourinary system: Secondary | ICD-10-CM

## 2024-02-19 LAB — POCT URINALYSIS DIP (CLINITEK)
Bilirubin, UA: NEGATIVE
Glucose, UA: 100 mg/dL — AB
Ketones, POC UA: NEGATIVE mg/dL
Nitrite, UA: POSITIVE — AB
POC PROTEIN,UA: 30 — AB
Spec Grav, UA: 1.015 (ref 1.010–1.025)
Urobilinogen, UA: 1 U/dL
pH, UA: 5.5 (ref 5.0–8.0)

## 2024-02-19 NOTE — Progress Notes (Signed)
 Last 3 urine cultures have been negative so we will send for culture at this time okay to use lidocaine  gel over-the-counter also to help with discomfort and numbing she has been also taking Azo.

## 2024-02-19 NOTE — Progress Notes (Signed)
 Patient presented for a NV with concerns of a UTI. Patient states that the urethra area burns/stings after voiding. She has been taking AZO OTC. Last dose taken was this morning. POCT, urine and culture obtained.   Per patient, she is not using the estrogen cream. Patient mentioned she has a Urologists appointment scheduled for the end of May.

## 2024-02-20 LAB — URINALYSIS, ROUTINE W REFLEX MICROSCOPIC
Glucose, UA: NEGATIVE
Ketones, UA: NEGATIVE
Nitrite, UA: POSITIVE — AB
RBC, UA: NEGATIVE
Specific Gravity, UA: 1.015 (ref 1.005–1.030)
Urobilinogen, Ur: 1 mg/dL (ref 0.2–1.0)
pH, UA: 6 (ref 5.0–7.5)

## 2024-02-20 LAB — MICROSCOPIC EXAMINATION
Bacteria, UA: NONE SEEN
Casts: NONE SEEN /LPF

## 2024-02-21 ENCOUNTER — Encounter: Payer: Self-pay | Admitting: Family Medicine

## 2024-02-21 LAB — SPECIMEN STATUS REPORT

## 2024-02-21 LAB — URINE CULTURE

## 2024-02-21 NOTE — Progress Notes (Signed)
 Call patient: Urine culture is negative no sign of infection.  Recommend a trial of lidocaine  gel this can be found over-the-counter and just a small dab can be placed at the area of the urethra up to 2-3 times a day as needed for discomfort.

## 2024-02-22 ENCOUNTER — Ambulatory Visit: Payer: Self-pay

## 2024-02-22 NOTE — Telephone Encounter (Signed)
 Contacted the patient regarding the swelling on her left ankle. Per patient, denies any injuries, redness, warmness to the touch, sensitivity, or pain in the area. The patient mentioned it only hurts when walking. She is resting and keeping her feet elevated when she can. Patient is okay with waiting until Monday for her appointment.

## 2024-02-22 NOTE — Telephone Encounter (Signed)
 Copied from CRM [id number]. Topic: Clinical - Red Word Triage >> Feb 22, 2024  9:20 AM Danelle Dunning F wrote: Red Word that prompted transfer to Nurse Triage:   Swollen left ankle; size of baseball; symptom started this morning    Chief Complaint: Left ankle swelling Symptoms: swelling Frequency: last night (02/21/2024) Pertinent Negatives: Patient denies pain, injuries, insect bites or stings, redness, itching, fever, chest pain, difficulty breathing, calf pain Disposition: [] ED /[] Urgent Care (no appt availability in office) / [x] Appointment(In office/virtual)/ []  [place] [facility]/ [] Home Care/ [] Refused Recommended Disposition /[] [place] [facility]/ []  Follow-up with PCP Additional Notes: Patient called and advised that her left ankle started swelling last night. She states she can still walk on it and the swelling is not severe. She describes the swelling size as "a small pancake" Patient denies pain, injuries, insect bites or stings, redness, itching, fever, chest pain, difficulty breathing, calf pain. Appointment is made for Monday 02/25/2024 at 9:50 am with patient's PCP. Patient is also advised that Urgent Cares are an option if she wants to have her ankle evaluated by someone before [facility] appointment. Patient is given Care Advice as per protocol and patient is also advised that if anything gets worse to go to the Emergency Room. Patient verbalized understanding.   Reason for Disposition  MILD or MODERATE ankle swelling (e.g., can't move joint normally, can't do usual activities) (Exceptions: Itchy, localized swelling; swelling is chronic.)  Answer Assessment - Initial Assessment Questions 1. LOCATION: "Which ankle is swollen?" "Where is the swelling?"     left 2. ONSET: "When did the swelling start?"     Started last night 3. SWELLING: "How bad is the swelling?" Or, "How large is it?" (e.g., mild, moderate, severe; size of localized swelling)    - NONE: No joint swelling.    - LOCALIZED: Localized; small area of puffy or swollen skin (e.g., insect bite, skin irritation).   - MILD: Joint looks or feels mildly swollen or puffy.   - MODERATE: Swollen; interferes with normal activities (e.g., work or school); decreased range of movement; may be limping.   - SEVERE: Very swollen; can't move swollen joint at all; limping a lot or unable to walk.     mild 4. PAIN: "Is there any pain?" If Yes, ask: "How bad is it?" (Scale 1-10; or mild, moderate, severe)   - NONE (0): no pain.   - MILD (1-3): doesn't interfere with normal activities.    - MODERATE (4-7): interferes with normal activities (e.g., work or school) or awakens from sleep, limping.    - SEVERE (8-10): excruciating pain, unable to do any normal activities, unable to walk.      0 5. CAUSE: "What do you think caused the ankle swelling?"     Unknown 6. OTHER SYMPTOMS: "Do you have any other symptoms?" (e.g., fever, chest pain, difficulty breathing, calf pain)     Denies  Protocols used: Ankle Swelling-A-AH

## 2024-02-24 ENCOUNTER — Other Ambulatory Visit: Payer: Self-pay | Admitting: Family Medicine

## 2024-02-24 DIAGNOSIS — I4891 Unspecified atrial fibrillation: Secondary | ICD-10-CM

## 2024-02-25 ENCOUNTER — Encounter: Payer: Self-pay | Admitting: Family Medicine

## 2024-02-25 ENCOUNTER — Ambulatory Visit (INDEPENDENT_AMBULATORY_CARE_PROVIDER_SITE_OTHER): Admitting: Family Medicine

## 2024-02-25 ENCOUNTER — Ambulatory Visit

## 2024-02-25 ENCOUNTER — Other Ambulatory Visit: Payer: Self-pay | Admitting: Family Medicine

## 2024-02-25 VITALS — BP 128/78 | HR 100 | Ht 64.0 in | Wt 153.0 lb

## 2024-02-25 DIAGNOSIS — E876 Hypokalemia: Secondary | ICD-10-CM

## 2024-02-25 DIAGNOSIS — M7989 Other specified soft tissue disorders: Secondary | ICD-10-CM | POA: Diagnosis not present

## 2024-02-25 DIAGNOSIS — R195 Other fecal abnormalities: Secondary | ICD-10-CM

## 2024-02-25 DIAGNOSIS — M25472 Effusion, left ankle: Secondary | ICD-10-CM

## 2024-02-25 DIAGNOSIS — I10 Essential (primary) hypertension: Secondary | ICD-10-CM

## 2024-02-25 NOTE — Progress Notes (Signed)
 Acute Office Visit  Subjective:     Patient ID: Belinda Owens, female    DOB: 01-28-1932, 89 y.o.   MRN: [id number]  Chief Complaint  Patient presents with   Joint Swelling    HPI Patient is in today for Left lateral ankle swelling that she noticed Friday.  She says she does not remember any recent injury trauma she had not been doing more exercise or walking than usual that would have triggered any swelling.  She said she did injure that ankle about 40 years ago she had a pretty severe sprain and was nonweightbearing for a couple of weeks it sounds like.  She says is not painful to walk on it currently when it is not painful at night.  Also since Friday night she has had a couple of loose stools.  Nothing watery no blood in the stool no fever or chills she has had a little occasional cramp in that left lower quadrant area.  No unusual foods.  ROS      Objective:    BP 128/78   Pulse 100   Ht 5\' 4"  (1.626 m)   Wt 153 lb (69.4 kg)   SpO2 95%   BMI 26.26 kg/m    Physical Exam Vitals reviewed.  Constitutional:      Appearance: Normal appearance.  HENT:     Head: Normocephalic.  Pulmonary:     Effort: Pulmonary effort is normal.  Abdominal:     General: Bowel sounds are normal. There is no distension.     Palpations: Abdomen is soft.     Tenderness: There is no abdominal tenderness.  Musculoskeletal:     Comments: Left lateral ankle is definitely larger than the right side.  But no induration no erythema no increased warmth.  Normal range of motion.  Tender anteriorly but she is also tender anterior to the lateral malleolus on the right side as well.  She does have some varicosities but again very similar to the right ankle.  But it is noticeably larger.  Neurological:     Mental Status: She is alert and oriented to person, place, and time.  Psychiatric:        Mood and Affect: Mood normal.        Behavior: Behavior normal.     No results found for any visits on  02/25/24.      Assessment & Plan:   Problem List Items Addressed This Visit   None Visit Diagnoses       Left ankle swelling    -  Primary   Relevant Orders   DG Ankle Complete Left     Loose stools           Loose stool since Friday-she says it is not watery stool she does a good job hydrating no fever.  If not improving over the next couple days please let me know.  If develops worsening symptoms then please let me know if she develops more intense discomfort in that left lower quadrant then consider diverticulitis.  Left lateral ankle swelling it is more swollen than the right side she is tender anteriorly on both ankles.  But she has normal range of motion and she is able to walk on the ankle without discomfort.  We did discuss maybe getting an x-ray just to make sure that were not missing anything but I think it also come back to showing some mild arthritis.  Again call with any new or worsening symptoms.  No orders of the defined types were placed in this encounter.   No follow-ups on file.  Duaine German, MD

## 2024-02-25 NOTE — Progress Notes (Signed)
 Hi Janeth, they did not see anything concerning on the x-ray which is good.  You do have a small heel spur but that is not the area that you are swelling and we will keep an eye on it try to elevate your feet for 15 to 20 minutes twice a day.  Stay active if you have compression stockings that can be helpful as well.  If it gets worse then please let us  know.

## 2024-02-27 NOTE — Telephone Encounter (Signed)
 Last OV: 02/25/24 Next OV: 03/06/24 Last RF: 09/05/24

## 2024-02-28 ENCOUNTER — Encounter (HOSPITAL_COMMUNITY): Payer: Self-pay

## 2024-03-06 ENCOUNTER — Other Ambulatory Visit: Payer: Self-pay | Admitting: Family Medicine

## 2024-03-06 ENCOUNTER — Ambulatory Visit: Admitting: Family Medicine

## 2024-03-06 DIAGNOSIS — E876 Hypokalemia: Secondary | ICD-10-CM

## 2024-03-06 DIAGNOSIS — I10 Essential (primary) hypertension: Secondary | ICD-10-CM

## 2024-03-07 ENCOUNTER — Telehealth: Payer: Self-pay

## 2024-03-07 NOTE — Telephone Encounter (Signed)
 Copied from CRM [id number]. Topic: Clinical - Medication Question >> Mar 07, 2024 10:49 AM Arlie Benedict B wrote: Reason for CRM: sertraline  (ZOLOFT ) 25 MG tablet; sarah for [facility] called in regards to the medication listed above. Pharmacist is concerned of the risk of bleeding with the patient being on Eliquis  2.5 mg tablet they are needing to know should they go ahead and fill the medication   Please call Sarah at [phone]

## 2024-03-07 NOTE — Telephone Encounter (Signed)
 Apologize, yes plus please step down on the sertraline .  She has been on it for quite some time and I do think it has been helpful but if it can increase from her risk for bleeding then I would prefer to stop it especially since she is on such a small dose if she is noticing shift in her mood while off the medication then we can always look at an alternative.

## 2024-03-07 NOTE — Telephone Encounter (Signed)
 Which medication do you want her to step down from?

## 2024-03-07 NOTE — Telephone Encounter (Signed)
 OK, lets call pt and let her know about contraindication see if she is ok with us  taking her off the medication and see how she does without it.   We can have a take a half a tab daily for 10 days snd then stop

## 2024-03-10 NOTE — Telephone Encounter (Signed)
 I advised the pharmacy. I did try to call patient. Left message for patient to return call.

## 2024-03-10 NOTE — Telephone Encounter (Signed)
 Patient advised.

## 2024-04-15 ENCOUNTER — Other Ambulatory Visit: Payer: Self-pay

## 2024-04-15 ENCOUNTER — Ambulatory Visit

## 2024-04-15 VITALS — Ht 64.0 in | Wt 155.0 lb

## 2024-04-15 DIAGNOSIS — I10 Essential (primary) hypertension: Secondary | ICD-10-CM

## 2024-04-15 DIAGNOSIS — Z1330 Encounter for screening examination for mental health and behavioral disorders, unspecified: Secondary | ICD-10-CM | POA: Diagnosis not present

## 2024-04-15 DIAGNOSIS — R300 Dysuria: Secondary | ICD-10-CM | POA: Diagnosis not present

## 2024-04-15 DIAGNOSIS — Z0000 Encounter for general adult medical examination without abnormal findings: Secondary | ICD-10-CM | POA: Diagnosis not present

## 2024-04-15 DIAGNOSIS — E876 Hypokalemia: Secondary | ICD-10-CM

## 2024-04-15 MED ORDER — ZOLPIDEM TARTRATE 5 MG PO TABS
ORAL_TABLET | ORAL | 0 refills | Status: DC
Start: 2024-04-15 — End: 2024-07-11

## 2024-04-15 NOTE — Patient Instructions (Signed)
  Belinda Owens , Thank you for taking time to come for your Medicare Wellness Visit. I appreciate your ongoing commitment to your health goals. Please review the following plan we discussed and let me know if I can assist you in the future.   These are the goals we discussed:  Goals       <enter goal here>      Starting 01/04/17, I will maintain my current lifestyle.       Exercise 3x per week (30 min per time)      Would like to start exercising more by walking      Medication Management      Patient Goals/Self-Care Activities Over the next 180 days, patient will:  take medications as prescribed   Follow Up Plan: Telephone follow up appointment with care management team member scheduled for:  6-8 months       Patient Stated (pt-stated)      01/19/2021 AWV Goal: Exercise for General Health  Patient will verbalize understanding of the benefits of increased physical activity: Exercising regularly is important. It will improve your overall fitness, flexibility, and endurance. Regular exercise also will improve your overall health. It can help you control your weight, reduce stress, and improve your bone density. Over the next year, patient will increase physical activity as tolerated with a goal of at least 150 minutes of moderate physical activity per week.  You can tell that you are exercising at a moderate intensity if your heart starts beating faster and you start breathing faster but can still hold a conversation. Moderate-intensity exercise ideas include: Walking 1 mile (1.6 km) in about 15 minutes Biking Hiking Golfing Dancing Water aerobics Patient will verbalize understanding of everyday activities that increase physical activity by providing examples like the following: Yard work, such as: Insurance underwriter Gardening Washing windows or floors Patient will be able to explain general safety  guidelines for exercising:  Before you start a new exercise program, talk with your health care provider. Do not exercise so much that you hurt yourself, feel dizzy, or get very short of breath. Wear comfortable clothes and wear shoes with good support. Drink plenty of water while you exercise to prevent dehydration or heat stroke. Work out until your breathing and your heartbeat get faster.       Patient Stated (pt-stated)      Would like to continue to be healthy.      Patient Stated (pt-stated)      Patient would like to work on her BP and make sure it stays in check.      Patient Stated      Patient states she would like to stay independent.         This is a list of the screening recommended for you and due dates:  Health Maintenance  Topic Date Due   Zoster (Shingles) Vaccine (1 of 2) Never done   COVID-19 Vaccine (4 - 2024-25 season) 06/24/2023   DTaP/Tdap/Td vaccine (2 - Td or Tdap) 03/23/2024   Flu Shot  05/23/2024   Medicare Annual Wellness Visit  04/15/2025   Pneumococcal Vaccine for age over 59  Completed   DEXA scan (bone density measurement)  Completed   Hepatitis B Vaccine  Aged Out   HPV Vaccine  Aged Out   Meningitis B Vaccine  Aged Out

## 2024-04-15 NOTE — Telephone Encounter (Signed)
 Last fill 02/27/24 last visit 02/25/24 Acute

## 2024-04-15 NOTE — Progress Notes (Signed)
 Subjective:   Belinda Owens is a 89 y.o. female who presents for Medicare Annual (Subsequent) preventive examination.  Visit Complete: Virtual I connected with  Saadiya Wilfong Bredeson on 04/15/24 by a audio enabled telemedicine application and verified that I am speaking with the correct person using two identifiers.  Patient Location: Home  Provider Location: Office/Clinic  I discussed the limitations of evaluation and management by telemedicine. The patient expressed understanding and agreed to proceed.  Vital Signs: Because this visit was a virtual/telehealth visit, some criteria may be missing or patient reported. Any vitals not documented were not able to be obtained and vitals that have been documented are patient reported.  Patient Medicare AWV questionnaire was completed by the patient on n/a; I have confirmed that all information answered by patient is correct and no changes since this date.  Cardiac Risk Factors include: advanced age (>18men, >67 women);smoking/ tobacco exposure;dyslipidemia;hypertension     Objective:    Today's Vitals   04/15/24 1303  Weight: 155 lb (70.3 kg)  Height: 5' 4 (1.626 m)   Body mass index is 26.61 kg/m.     04/15/2024    1:13 PM 04/09/2023   10:39 AM 04/03/2022    1:12 PM 01/19/2021   10:01 AM 07/29/2019    4:03 PM 03/13/2017   11:51 AM 01/04/2017   10:04 AM  Advanced Directives  Does Patient Have a Medical Advance Directive? No No No No No No  No   Would patient like information on creating a medical advance directive? Yes (MAU/Ambulatory/Procedural Areas - Information given) No - Patient declined No - Patient declined No - Patient declined No - Patient declined No - Patient declined  Yes (ED - Information included in AVS)      Data saved with a previous flowsheet row definition    Current Medications (verified) Outpatient Encounter Medications as of 04/15/2024  Medication Sig   amLODipine  (NORVASC ) 5 MG tablet TAKE 1 TABLET BY MOUTH  DAILY   conjugated estrogens  (PREMARIN ) vaginal cream Apply pea sized amount nightly to the urethra and outer vaginal area nightly for 2 weeks and then 3 x a week.   cyanocobalamin  1000 MCG tablet Take 1,000 mcg by mouth daily.   ELIQUIS  2.5 MG TABS tablet TAKE 1 TABLET BY MOUTH 2 TIMES A DAY   losartan -hydrochlorothiazide (HYZAAR) 100-12.5 MG tablet Take 1 tablet by mouth daily.   metoprolol  succinate (TOPROL -XL) 50 MG 24 hr tablet Take 1 tablet (50 mg total) by mouth daily.   Multiple Vitamin (MULTIVITAMIN WITH MINERALS) TABS tablet Take 1 tablet by mouth in the morning. Centrum Silver for Women   pravastatin  (PRAVACHOL ) 20 MG tablet TAKE 1 TABLET BY MOUTH AT BEDTIME   sertraline  (ZOLOFT ) 25 MG tablet TAKE 1 TABLET BY MOUTH DAILY   vitamin D3 (CHOLECALCIFEROL) 25 MCG tablet Take 1,000 Units by mouth in the morning.   zinc sulfate 220 (50 Zn) MG capsule Take 220 mg by mouth in the morning.   zolpidem  (AMBIEN ) 5 MG tablet TAKE 1/2 TO 1 TABLET BY MOUTH EVERY NIGHT AT BEDTIME AS NEEDED FOR SLEEP   No facility-administered encounter medications on file as of 04/15/2024.    Allergies (verified) Atorvastatin , Omeprazole, Tylenol with codeine #3  [acetaminophen-codeine], Atrovent  nasal spray [ipratropium], and Sulfa antibiotics   History: Past Medical History:  Diagnosis Date   Fibrocystic breast changes of both breasts    Gets yearly mammo and US    History of rheumatic fever    Hypertension  Past Surgical History:  Procedure Laterality Date   BREAST BIOPSY     CARDIOVERSION N/A 03/28/2023   Procedure: CARDIOVERSION;  Surgeon: Raford Riggs, MD;  Location: [facility] INVASIVE CV LAB;  Service: Cardiovascular;  Laterality: N/A;   MENISCUS REPAIR Right    RECONSTRUCTION OF EYELID Bilateral 12/14/2016   ROTATOR CUFF REPAIR Right    Family History  Problem Relation Age of Onset   Breast cancer Maternal Aunt    Colon cancer Father    Asthma Daughter    Heart disease Mother    Uterine cancer  Mother    Cancer - Other Brother    Social History   Socioeconomic History   Marital status: Divorced    Spouse name: Not on file   Number of children: 3   Years of education: 13   Highest education level: Some college, no degree  Occupational History   Occupation: retired.     Comment: [employer]  Tobacco Use   Smoking status: Former    Current packs/day: 0.00    Types: Cigarettes    Quit date: 10/23/1957    Years since quitting: 66.5   Smokeless tobacco: Never  Vaping Use   Vaping status: Never Used  Substance and Sexual Activity   Alcohol use: Not Currently    Alcohol/week: 0.0 standard drinks of alcohol   Drug use: No   Sexual activity: Never    Partners: Male    Birth control/protection: None  Other Topics Concern   Not on file  Social History Narrative   Lives alone. Had three children, one has deceased. She enjoys playing scrabble.   Social Drivers of Corporate investment banker Strain: Low Risk  (04/15/2024)   Overall Financial Resource Strain (CARDIA)    Difficulty of Paying Living Expenses: Not hard at all  Food Insecurity: No Food Insecurity (04/15/2024)   Hunger Vital Sign    Worried About Running Out of Food in the Last Year: Never true    Ran Out of Food in the Last Year: Never true  Transportation Needs: No Transportation Needs (04/15/2024)   PRAPARE - Administrator, Civil Service (Medical): No    Lack of Transportation (Non-Medical): No  Physical Activity: Insufficiently Active (04/15/2024)   Exercise Vital Sign    Days of Exercise per Week: 7 days    Minutes of Exercise per Session: 20 min  Stress: No Stress Concern Present (04/15/2024)   [employer] of Occupational Health - Occupational Stress Questionnaire    Feeling of Stress: Not at all  Social Connections: Moderately Integrated (04/15/2024)   Social Connection and Isolation Panel    Frequency of Communication with Friends and Family: More than three times a week     Frequency of Social Gatherings with Friends and Family: More than three times a week    Attends Religious Services: More than 4 times per year    Active Member of [employer] or Organizations: Yes    Attends Engineer, structural: More than 4 times per year    Marital Status: Divorced    Tobacco Counseling Counseling given: Not Answered   Clinical Intake:  Pre-visit preparation completed: Yes  Pain : No/denies pain     BMI - recorded: 26.61 Nutritional Status: BMI 25 -29 Overweight Nutritional Risks: None Diabetes: No  How often do you need to have someone help you when you read instructions, pamphlets, or other written materials from your doctor or pharmacy?: 1 - Never What is the last  grade level you completed in school?: 13  Interpreter Needed?: No      Activities of Daily Living    04/15/2024    1:05 PM  In your present state of health, do you have any difficulty performing the following activities:  Hearing? 0  Vision? 0  Difficulty concentrating or making decisions? 0  Walking or climbing stairs? 0  Dressing or bathing? 0  Doing errands, shopping? 0  Preparing Food and eating ? N  Using the Toilet? N  In the past six months, have you accidently leaked urine? N  Do you have problems with loss of bowel control? N  Managing your Medications? N  Managing your Finances? N  Housekeeping or managing your Housekeeping? N    Patient Care Team: Alvan Dorothyann BIRCH, MD as PCP - General (Family Medicine) Cleatus Moccasin, MD as Consulting Physician (Obstetrics and Gynecology) Girard Norway, MD as Referring Physician (Internal Medicine)  Indicate any recent Medical Services you may have received from other than [facility] providers in the past year (date may be approximate).     Assessment:   This is a routine wellness examination for [place].  Hearing/Vision screen No results found.   Goals Addressed             This Visit's Progress    Patient Stated        Patient states she would like to stay independent.        Depression Screen    04/15/2024    1:12 PM 10/12/2023    3:14 PM 04/09/2023   10:39 AM 04/03/2022    1:13 PM 02/20/2022    3:24 PM 12/09/2021    2:35 PM 01/19/2021   10:01 AM  PHQ 2/9 Scores  PHQ - 2 Score 0 0 0 0 0 0 0    Fall Risk    04/15/2024    1:14 PM 10/12/2023    3:15 PM 04/09/2023   10:39 AM 03/05/2023    1:07 PM 04/03/2022    1:13 PM  Fall Risk   Falls in the past year? 1  1 0 1  Number falls in past yr: 0 1 0 0 0  Injury with Fall? 0 0 0 0 0  Risk for fall due to : Impaired mobility  History of fall(s) No Fall Risks No Fall Risks  Follow up Falls evaluation completed  Falls evaluation completed Falls evaluation completed Falls evaluation completed      Data saved with a previous flowsheet row definition    MEDICARE RISK AT HOME: Medicare Risk at Home Any stairs in or around the home?: Yes If so, are there any without handrails?: Yes Home free of loose throw rugs in walkways, pet beds, electrical cords, etc?: Yes Adequate lighting in your home to reduce risk of falls?: Yes Life alert?: No Use of a cane, walker or w/c?: Yes Grab bars in the bathroom?: Yes Shower chair or bench in shower?: No Elevated toilet seat or a handicapped toilet?: No  TIMED UP AND GO:  Was the test performed?  No    Cognitive Function:    01/04/2017   10:23 AM  MMSE - Mini Mental State Exam  Orientation to time 5   Orientation to Place 5   Registration 3   Attention/ Calculation 5   Recall 3   Language- name 2 objects 2   Language- repeat 1  Language- follow 3 step command 3   Language- read & follow direction 1   Write  a sentence 1   Copy design 1   Total score 30      Data saved with a previous flowsheet row definition        04/15/2024    1:16 PM 04/09/2023   10:44 AM 04/03/2022    1:17 PM 01/19/2021   10:05 AM 07/29/2019    4:06 PM  6CIT Screen  What Year? 0 points 0 points 0 points 0 points 0 points   What month? 0 points 0 points 0 points 0 points 0 points  What time? 0 points 0 points 0 points 0 points 0 points  Count back from 20 0 points 0 points 0 points 0 points 0 points  Months in reverse 0 points 0 points 0 points 0 points 0 points  Repeat phrase 4 points 0 points 0 points 0 points 0 points  Total Score 4 points 0 points 0 points 0 points 0 points    Immunizations Immunization History  Administered Date(s) Administered   Fluad Quad(high Dose 65+) 07/15/2019, 09/19/2021, 09/11/2022   Fluad Trivalent(High Dose 65+) 06/21/2023   Influenza, High Dose Seasonal PF 07/16/2017, 07/17/2018   Influenza,inj,Quad PF,6+ Mos 08/05/2015, 07/06/2016   Influenza-Unspecified 07/15/2020   Moderna Sars-Covid-2 Vaccination 01/01/2020, 02/03/2020   PFIZER(Purple Top)SARS-COV-2 Vaccination 07/15/2020   PNEUMOCOCCAL CONJUGATE-20 03/10/2022   Pneumococcal Conjugate-13 08/19/2015   Pneumococcal Polysaccharide-23 01/04/2017   Tdap 03/23/2014    TDAP status: Due, Education has been provided regarding the importance of this vaccine. Advised may receive this vaccine at local pharmacy or Health Dept. Aware to provide a copy of the vaccination record if obtained from local pharmacy or Health Dept. Verbalized acceptance and understanding.  Flu Vaccine status: Up to date  Pneumococcal vaccine status: Up to date  Covid-19 vaccine status: Declined, Education has been provided regarding the importance of this vaccine but patient still declined. Advised may receive this vaccine at local pharmacy or Health Dept.or vaccine clinic. Aware to provide a copy of the vaccination record if obtained from local pharmacy or Health Dept. Verbalized acceptance and understanding.  Qualifies for Shingles Vaccine? Yes   Zostavax completed No   Shingrix Completed?: No.    Education has been provided regarding the importance of this vaccine. Patient has been advised to call insurance company to determine out of pocket expense  if they have not yet received this vaccine. Advised may also receive vaccine at local pharmacy or Health Dept. Verbalized acceptance and understanding.  Screening Tests Health Maintenance  Topic Date Due   Zoster Vaccines- Shingrix (1 of 2) Never done   COVID-19 Vaccine (4 - 2024-25 season) 06/24/2023   DTaP/Tdap/Td (2 - Td or Tdap) 03/23/2024   INFLUENZA VACCINE  05/23/2024   Medicare Annual Wellness (AWV)  04/15/2025   Pneumococcal Vaccine: 50+ Years  Completed   DEXA SCAN  Completed   Hepatitis B Vaccines  Aged Out   HPV VACCINES  Aged Out   Meningococcal B Vaccine  Aged Out    Health Maintenance  Health Maintenance Due  Topic Date Due   Zoster Vaccines- Shingrix (1 of 2) Never done   COVID-19 Vaccine (4 - 2024-25 season) 06/24/2023   DTaP/Tdap/Td (2 - Td or Tdap) 03/23/2024    Colorectal cancer screening: No longer required.   Mammogram status: No longer required due to age.  Bone Density status: Completed 08/13/2019. Results reflect: Bone density results: OSTEOPENIA. Repeat every 2 years.  Lung Cancer Screening: (Low Dose CT Chest recommended if Age 40-80 years, 20 pack-year currently smoking  OR have quit w/in 15years.) does not qualify.   Lung Cancer Screening Referral: n/a  Additional Screening:  Hepatitis C Screening: does not qualify; Completed   Vision Screening: Recommended annual ophthalmology exams for early detection of glaucoma and other disorders of the eye. Is the patient up to date with their annual eye exam?  Yes  Who is the provider or what is the name of the office in which the patient attends annual eye exams?  If pt is not established with a provider, would they like to be referred to a provider to establish care? N/a.   Dental Screening: Recommended annual dental exams for proper oral hygiene  Community Resource Referral / Chronic Care Management: CRR required this visit?  No   CCM required this visit?  No     Plan:     I have  personally reviewed and noted the following in the patient's chart:   Medical and social history Use of alcohol, tobacco or illicit drugs  Current medications and supplements including opioid prescriptions. Patient is not currently taking opioid prescriptions. Functional ability and status Nutritional status Physical activity Advanced directives List of other physicians Hospitalizations, surgeries, and ER visits in previous 12 months. None Vitals Screenings to include cognitive, depression, and falls Referrals and appointments  In addition, I have reviewed and discussed with patient certain preventive protocols, quality metrics, and best practice recommendations. A written personalized care plan for preventive services as well as general preventive health recommendations were provided to patient.     Bonny Jon Mayor, [place]   04/15/2024   After Visit Summary: (MyChart) Due to this being a telephonic visit, the after visit summary with patients personalized plan was offered to patient via MyChart   Nurse Notes:   DELAINA FETSCH is a 89 y.o. female patient of Metheney, Dorothyann BIRCH, MD who had a Medicare Annual Wellness Visit today via telephone. Veleta is Retired and lives alone. She has 3 children. she reports that she is socially active and does interact with friends/family regularly. She is moderately physically active and enjoys playing scrabble.

## 2024-04-20 ENCOUNTER — Other Ambulatory Visit: Payer: Self-pay | Admitting: Family Medicine

## 2024-04-20 DIAGNOSIS — E876 Hypokalemia: Secondary | ICD-10-CM

## 2024-04-20 DIAGNOSIS — I10 Essential (primary) hypertension: Secondary | ICD-10-CM

## 2024-04-25 DIAGNOSIS — R300 Dysuria: Secondary | ICD-10-CM | POA: Diagnosis not present

## 2024-04-25 DIAGNOSIS — N3001 Acute cystitis with hematuria: Secondary | ICD-10-CM | POA: Diagnosis not present

## 2024-05-10 DIAGNOSIS — R300 Dysuria: Secondary | ICD-10-CM | POA: Diagnosis not present

## 2024-05-17 DIAGNOSIS — R300 Dysuria: Secondary | ICD-10-CM | POA: Diagnosis not present

## 2024-05-27 ENCOUNTER — Ambulatory Visit: Admitting: Sports Medicine

## 2024-06-10 ENCOUNTER — Other Ambulatory Visit: Payer: Self-pay | Admitting: *Deleted

## 2024-06-10 ENCOUNTER — Other Ambulatory Visit: Payer: Self-pay | Admitting: Family Medicine

## 2024-06-10 DIAGNOSIS — I10 Essential (primary) hypertension: Secondary | ICD-10-CM

## 2024-06-10 DIAGNOSIS — E876 Hypokalemia: Secondary | ICD-10-CM

## 2024-06-10 MED ORDER — SERTRALINE HCL 25 MG PO TABS: 25.0000 mg | ORAL_TABLET | Freq: Every day | ORAL | 0 refills | Status: DC

## 2024-06-12 ENCOUNTER — Other Ambulatory Visit: Payer: Self-pay | Admitting: *Deleted

## 2024-06-12 DIAGNOSIS — I10 Essential (primary) hypertension: Secondary | ICD-10-CM

## 2024-06-12 DIAGNOSIS — E876 Hypokalemia: Secondary | ICD-10-CM

## 2024-06-12 MED ORDER — SERTRALINE HCL 25 MG PO TABS: 25.0000 mg | ORAL_TABLET | Freq: Every day | ORAL | 0 refills | Status: DC

## 2024-06-16 ENCOUNTER — Ambulatory Visit: Payer: Self-pay

## 2024-06-16 ENCOUNTER — Other Ambulatory Visit: Payer: Self-pay | Admitting: Family Medicine

## 2024-06-16 DIAGNOSIS — R300 Dysuria: Secondary | ICD-10-CM | POA: Diagnosis not present

## 2024-06-16 DIAGNOSIS — I10 Essential (primary) hypertension: Secondary | ICD-10-CM

## 2024-06-16 DIAGNOSIS — N3090 Cystitis, unspecified without hematuria: Secondary | ICD-10-CM | POA: Diagnosis not present

## 2024-06-16 DIAGNOSIS — I4891 Unspecified atrial fibrillation: Secondary | ICD-10-CM

## 2024-06-16 NOTE — Telephone Encounter (Signed)
 Copied from CRM #[id number]. Topic: Clinical - Medication Refill >> Jun 16, 2024 10:17 AM Keoka S wrote: Medication: metoprolol  succinate (TOPROL -XL) 50 MG 24 hr tablet  Has the patient contacted their pharmacy? Yes (Agent: If no, request that the patient contact the pharmacy for the refill. If patient does not wish to contact the pharmacy document the reason why and proceed with request.) (Agent: If yes, when and what did the pharmacy advise?)  This is the patient's preferred pharmacy:  [facility] [id number] - [place], [place] - [address] [place] [address] Phone: [phone] Fax: [phone]  Is this the correct pharmacy for this prescription? Yes If no, delete pharmacy and type the correct one.   Has the prescription been filled recently? No  Is the patient out of the medication? Yes  Has the patient been seen for an appointment in the last year OR does the patient have an upcoming appointment? Yes  Can we respond through MyChart? Yes  Agent: Please be advised that Rx refills may take up to 3 business days. We ask that you follow-up with your pharmacy.

## 2024-06-16 NOTE — Telephone Encounter (Signed)
 Triaged to UC as no in office availability until next week. Patient agreed.  FYI Only or Action Required?: FYI only for provider.  Patient was last seen in primary care on 02/25/2024 by Alvan Dorothyann BIRCH, MD.  Called Nurse Triage reporting Triage.  Symptoms began yesterday.  Interventions attempted: Nothing.  Symptoms are: gradually worsening.  Triage Disposition: See Physician Within 24 Hours  Patient/caregiver understands and will follow disposition?: Yes Reason for Disposition  Age > 50 years  More than 2 UTI's in last year  Answer Assessment - Initial Assessment Questions 1. SEVERITY: How bad is the pain?  (e.g., Scale 1-10; mild, moderate, or severe)     Severe burning during and after urination  2. ONSET: When did the painful urination start?      Yesterday  3. PAST UTI: Have you had a urine infection before?     Yes  4. CAUSE: What do you think is causing the painful urination?  (e.g., UTI, scratch, Herpes sore)     UTI  5. OTHER SYMPTOMS: Do you have any other symptoms? (e.g., blood in urine, flank pain, genital sores, urgency, vaginal discharge)     Denies  Protocols used: Urination Pain - [facility] Copied from CRM #[id number]. Topic: Clinical - Red Word Triage >> Jun 16, 2024  4:17 PM Mercer PEDLAR wrote: Red Word that prompted transfer to Nurse Triage: Burning urethra.

## 2024-06-17 ENCOUNTER — Other Ambulatory Visit: Payer: Self-pay | Admitting: *Deleted

## 2024-06-17 DIAGNOSIS — I4891 Unspecified atrial fibrillation: Secondary | ICD-10-CM

## 2024-06-17 DIAGNOSIS — I10 Essential (primary) hypertension: Secondary | ICD-10-CM

## 2024-06-17 MED ORDER — METOPROLOL SUCCINATE ER 50 MG PO TB24: 50.0000 mg | ORAL_TABLET | Freq: Every day | ORAL | 3 refills | Status: AC

## 2024-06-17 NOTE — Telephone Encounter (Signed)
 We can also place her on nurse visit schedule.  As long as she does not have a fever.

## 2024-06-18 ENCOUNTER — Ambulatory Visit

## 2024-06-18 NOTE — Telephone Encounter (Signed)
 Patient scheduled for today 06/18/2024 at 2:30pm

## 2024-06-24 ENCOUNTER — Encounter: Payer: Self-pay | Admitting: Sports Medicine

## 2024-07-03 ENCOUNTER — Ambulatory Visit: Payer: Self-pay

## 2024-07-03 NOTE — Telephone Encounter (Signed)
 Patient scheduled for 07/07/2024 with Vermell bologna  Does patient need to be seen sooner?

## 2024-07-03 NOTE — Telephone Encounter (Addendum)
 FYI Only or Action Required?: Action required by provider: update on patient condition.  Patient was last seen in primary care on 02/25/2024 by Alvan Dorothyann BIRCH, MD.  Called Nurse Triage reporting Urinary Tract Infection.  Symptoms began several days ago.  Interventions attempted: Rest, hydration, or home remedies.  Symptoms are: gradually worsening.  Triage Disposition: See Physician Within 24 Hours  Patient/caregiver understands and will follow disposition?:  Reason for Disposition  Bad or foul-smelling urine  Answer Assessment - Initial Assessment Questions Patient did not wish to be scheduled for a sooner appt at another location. Scheduled first available with PCP office. Is asking if she can be prescribed anything in the mean time.  06/16/24 dx with cystitis at [facility]. Denies being tx with antibiotics. States symptoms resolved and have now returned. Has urology appt in October for chronic UTIs.  Patient reports that after urination, she experiences a burning sensation. States she does not believe she is experiencing skin irritation. Denies fever, nausea, vomiting, flank pain, or discoloration. States urine is slightly malodorous.   Advised to go to UC or ED if she develops fever, nausea, vomiting, flank pain or any other severe symptoms. Confirmed no flank pain, as it was noted in original triage request. Pt verbalized understanding.  1. SYMPTOM: What's the main symptom you're concerned about? (e.g., frequency, incontinence)     Burning after urination  2. ONSET:      Chronic, resolved after UC visit when she was diagnosed with cystitis and returned several days ago  3. PAIN:      Burning sensation  4. CAUSE: What do you think is causing the symptoms?     UTI/cystitis  5. OTHER SYMPTOMS: Do you have any other symptoms? (e.g., blood in urine, fever, flank pain, pain with urination)     Denies  Protocols used: Urinary Symptoms-A-AH Copied from CRM #[id number].  Topic: Clinical - Red Word Triage >> Jul 03, 2024  4:19 PM Kevelyn M wrote: Burning while urinating. Pain in left side above kidney for a few days.

## 2024-07-04 NOTE — Addendum Note (Signed)
 Addended by: Millard Bautch D on: 07/04/2024 11:54 AM   Modules accepted: Orders

## 2024-07-04 NOTE — Telephone Encounter (Signed)
 We can work her in on the nurse schedule.  It looks like her urine culture from August 25 came back negative so she did not have an infection.

## 2024-07-07 ENCOUNTER — Ambulatory Visit (INDEPENDENT_AMBULATORY_CARE_PROVIDER_SITE_OTHER): Admitting: Physician Assistant

## 2024-07-07 VITALS — BP 142/92 | HR 78 | Ht 64.0 in | Wt 151.0 lb

## 2024-07-07 DIAGNOSIS — N390 Urinary tract infection, site not specified: Secondary | ICD-10-CM

## 2024-07-07 DIAGNOSIS — R300 Dysuria: Secondary | ICD-10-CM | POA: Diagnosis not present

## 2024-07-07 LAB — POCT URINALYSIS DIP (CLINITEK)
Bilirubin, UA: NEGATIVE
Glucose, UA: NEGATIVE mg/dL
Ketones, POC UA: NEGATIVE mg/dL
Leukocytes, UA: NEGATIVE
Nitrite, UA: NEGATIVE
Spec Grav, UA: 1.015 (ref 1.010–1.025)
Urobilinogen, UA: 0.2 U/dL
pH, UA: 7 (ref 5.0–8.0)

## 2024-07-07 MED ORDER — PHENAZOPYRIDINE HCL 200 MG PO TABS: 200.0000 mg | ORAL_TABLET | Freq: Three times a day (TID) | ORAL | 0 refills | Status: AC

## 2024-07-07 NOTE — Progress Notes (Unsigned)
 Acute Office Visit  Subjective:     Patient ID: Belinda Owens, female    DOB: Jun 03, 1932, 89 y.o.   MRN: [id number]  Chief Complaint  Patient presents with   Dysuria    HPI Patient is in today for pain with urination since yesterday. Pt has a long hx of cystitis both from bacteria and due to inflammation. Her last antibiotic was given in July 2025. She has upcoming appt with urology for symptoms. She uses estrogen topical cream to help prevent symptoms. She denies any fever, chills, body aches, urinary frequency or urgency, nausea, vomiting, flank pain. She took azo last night and was not helpful for symptoms.   ROS See HPI>      Objective:    BP (!) 142/92   Pulse 78   Ht 5' 4 (1.626 m)   Wt 151 lb (68.5 kg)   SpO2 99%   BMI 25.92 kg/m  BP Readings from Last 3 Encounters:  07/07/24 (!) 142/92  02/25/24 128/78  02/19/24 130/74   Wt Readings from Last 3 Encounters:  07/07/24 151 lb (68.5 kg)  04/15/24 155 lb (70.3 kg)  02/25/24 153 lb (69.4 kg)    .SABRA Results for orders placed or performed in visit on 07/07/24  POCT URINALYSIS DIP (CLINITEK)   Collection Time: 07/07/24  1:49 PM  Result Value Ref Range   Color, UA yellow yellow   Clarity, UA clear clear   Glucose, UA negative negative mg/dL   Bilirubin, UA negative negative   Ketones, POC UA negative negative mg/dL   Spec Grav, UA 8.984 8.989 - 1.025   Blood, UA trace-lysed (A) negative   pH, UA 7.0 5.0 - 8.0   POC PROTEIN,UA trace negative, trace   Urobilinogen, UA 0.2 0.2 or 1.0 E.U./dL   Nitrite, UA Negative Negative   Leukocytes, UA Negative Negative    Physical Exam Constitutional:      Appearance: Normal appearance.  Cardiovascular:     Rate and Rhythm: Normal rate and regular rhythm.  Pulmonary:     Effort: Pulmonary effort is normal.     Breath sounds: Normal breath sounds.  Abdominal:     General: There is no distension.     Palpations: Abdomen is soft. There is no mass.     Tenderness:  There is no abdominal tenderness. There is no right CVA tenderness, left CVA tenderness, guarding or rebound.  Neurological:     Mental Status: She is alert and oriented to person, place, and time.  Psychiatric:        Mood and Affect: Mood normal.           Assessment & Plan:  SABRASABRALisette was seen today for dysuria.  Diagnoses and all orders for this visit:  Dysuria -     POCT URINALYSIS DIP (CLINITEK) -     Urine Culture -     phenazopyridine  (PYRIDIUM ) 200 MG tablet; Take 1 tablet (200 mg total) by mouth 3 (three) times daily for 2 days.   Pt is only having burning with urination but no other symptoms of UTI UA showed trace blood and protein Will culture and treat accordingly Concerned this is more inflammation than infection Pt is on topical estrogen cream over urethra Will start with pyridium  for 3 days and lidocaine  topically over urethra for discomfort Drink cranberry juice and water She has upcoming urology appt Follow up as needed and if symptoms persist, change or worsen   Vermell Bologna, PA-C

## 2024-07-07 NOTE — Patient Instructions (Addendum)
 Start pyridium  for 3 days and wait for urine culture.  Stay hydrated. Drink cranberry juice.  Use lidocaine  over urethral opening.

## 2024-07-07 NOTE — Telephone Encounter (Signed)
 Patient seen in office today with Belinda Owens

## 2024-07-08 DIAGNOSIS — R300 Dysuria: Secondary | ICD-10-CM | POA: Insufficient documentation

## 2024-07-08 DIAGNOSIS — N390 Urinary tract infection, site not specified: Secondary | ICD-10-CM | POA: Insufficient documentation

## 2024-07-09 ENCOUNTER — Other Ambulatory Visit: Payer: Self-pay | Admitting: Family Medicine

## 2024-07-09 ENCOUNTER — Ambulatory Visit: Payer: Self-pay | Admitting: Physician Assistant

## 2024-07-09 DIAGNOSIS — I4891 Unspecified atrial fibrillation: Secondary | ICD-10-CM

## 2024-07-09 LAB — URINE CULTURE

## 2024-07-09 NOTE — Progress Notes (Signed)
 Belinda Owens,   No significant bacteria found in urine culture.

## 2024-07-10 ENCOUNTER — Other Ambulatory Visit: Payer: Self-pay | Admitting: Family Medicine

## 2024-07-10 ENCOUNTER — Ambulatory Visit: Payer: Self-pay

## 2024-07-10 DIAGNOSIS — E876 Hypokalemia: Secondary | ICD-10-CM

## 2024-07-10 DIAGNOSIS — I10 Essential (primary) hypertension: Secondary | ICD-10-CM

## 2024-07-10 NOTE — Telephone Encounter (Signed)
 FYI Only or Action Required?: Action required by provider: update on patient condition.  Patient was last seen in primary care on 07/07/2024 by Antoniette Vermell CROME, PA-C.  Called Nurse Triage reporting No chief complaint on file..  Symptoms began today.  Interventions attempted: Prescription medications:  SABRA  Symptoms are: unchanged.Pt. States she is still having burning at urethra and vagina. Low back pain. What do I do Now? Please advise pt.  Triage Disposition: See PCP Within 2 Weeks  Patient/caregiver understands and will follow disposition?: Yes    Copied from CRM [id number]. Topic: Clinical - Red Word Triage >> Jul 10, 2024  3:28 PM Zane F wrote: Concern: Burning urethra and vaginal area after taking prescribed medication post 07/07/2024 visit with Vermell Antoniette  Symptoms: Burning worsens after urination   When did the symptoms start?:   What have you done to aid in the concern ? Have you taken anything to assist with the matter?: Yes    If so, what did you take?:phenazopyridine  (PYRIDIUM ) 200 MG tablet  for 3 days Reason for Disposition  All other urine symptoms  Answer Assessment - Initial Assessment Questions 1. SYMPTOM: What's the main symptom you're concerned about? (e.g., frequency, incontinence)     Burning, bladder pain 2. ONSET: When did the    start?     This week 3. PAIN: Is there any pain? If Yes, ask: How bad is it? (Scale: 1-10; mild, moderate, severe)     6-7 4. CAUSE: What do you think is causing the symptoms?     bladder 5. OTHER SYMPTOMS: Do you have any other symptoms? (e.g., blood in urine, fever, flank pain, pain with urination)     no 6. PREGNANCY: Is there any chance you are pregnant? When was your last menstrual period?     no  Protocols used: Urinary Symptoms-A-AH

## 2024-07-11 NOTE — Telephone Encounter (Signed)
 Patient informed.

## 2024-07-11 NOTE — Telephone Encounter (Signed)
 Zolpidem  Last OV: 07/07/24 Next OV: no appointment scheduled Last RF: 04/15/24

## 2024-08-01 ENCOUNTER — Ambulatory Visit: Admitting: Urology

## 2024-08-01 ENCOUNTER — Encounter: Payer: Self-pay | Admitting: Urology

## 2024-08-01 VITALS — BP 135/85 | HR 94 | Ht 64.0 in | Wt 145.0 lb

## 2024-08-01 DIAGNOSIS — Z87440 Personal history of urinary (tract) infections: Secondary | ICD-10-CM

## 2024-08-01 DIAGNOSIS — N952 Postmenopausal atrophic vaginitis: Secondary | ICD-10-CM

## 2024-08-01 DIAGNOSIS — R300 Dysuria: Secondary | ICD-10-CM

## 2024-08-01 LAB — URINALYSIS, ROUTINE W REFLEX MICROSCOPIC
Bilirubin, UA: NEGATIVE
Glucose, UA: NEGATIVE
Ketones, UA: NEGATIVE
Leukocytes,UA: NEGATIVE
Nitrite, UA: NEGATIVE
Protein,UA: NEGATIVE
Specific Gravity, UA: 1.01 (ref 1.005–1.030)
Urobilinogen, Ur: 0.2 mg/dL (ref 0.2–1.0)
pH, UA: 6 (ref 5.0–7.5)

## 2024-08-01 LAB — MICROSCOPIC EXAMINATION

## 2024-08-01 LAB — BLADDER SCAN AMB NON-IMAGING

## 2024-08-01 NOTE — Progress Notes (Signed)
 Assessment: 1. Atrophic vaginitis   2. History of UTI   3. Dysuria     Plan: I personally reviewed the patient's chart including provider notes, lab results. I reviewed the records from [facility] urology. I do not see any evidence of a UTI at the present time.  I do not think her symptoms are related to UTIs given a review of all her urine cultures. She has evidence of atrophic changes in the vaginal area. Recommend vaginal hormone cream 3 times a week.  Instructions given. Return to office in 6 weeks.  Chief Complaint:  Chief Complaint  Patient presents with   Hx of UTI    History of Present Illness:  Belinda Owens is a 89 y.o. female who is seen for evaluation of history of UTIs and dysuria. She has previously seen Dr. Tanda with [facility] with her last visit in June 2025. She reports a burning sensation in the vaginal area.  She also has some discomfort after voiding.  Her symptoms been intermittent and ongoing for 18 months.  She has been seen at urgent care multiple times to evaluate for possible UTIs. Urine culture results: 04/25/24  Mixed flora 05/10/24 60-100K [place]ard[place], 10-50K Enterococcus 05/17/24 No growth 06/18/24 No growth 07/09/24 25-50K mixed flora  She does not have frequency or urgency.  No significant incontinence.  No gross hematuria or flank pain. She has a prescription for Estrace vaginal cream but has not been using this on a regular basis. She reports a prior cystoscopy a number of years ago when living in [place] .  Past Medical History:  Past Medical History:  Diagnosis Date   Fibrocystic breast changes of both breasts    Gets yearly mammo and US    History of rheumatic fever    Hypertension     Past Surgical History:  Past Surgical History:  Procedure Laterality Date   BREAST BIOPSY     CARDIOVERSION N/A 03/28/2023   Procedure: CARDIOVERSION;  Surgeon: Raford Riggs, MD;  Location: [facility] INVASIVE CV LAB;  Service: Cardiovascular;   Laterality: N/A;   MENISCUS REPAIR Right    RECONSTRUCTION OF EYELID Bilateral 12/14/2016   ROTATOR CUFF REPAIR Right     Allergies:  Allergies  Allergen Reactions   Atorvastatin  Other (See Comments)    Myalgia   Omeprazole Itching   Tylenol With Codeine #3  [Acetaminophen-Codeine] Nausea And Vomiting   Atrovent  Nasal Spray [Ipratropium] Other (See Comments)    burning   Sulfa Antibiotics Dermatitis    Family History:  Family History  Problem Relation Age of Onset   Breast cancer Maternal Aunt    Colon cancer Father    Asthma Daughter    Heart disease Mother    Uterine cancer Mother    Cancer - Other Brother     Social History:  Social History   Tobacco Use   Smoking status: Former    Current packs/day: 0.00    Types: Cigarettes    Quit date: 10/23/1957    Years since quitting: 66.8   Smokeless tobacco: Never  Vaping Use   Vaping status: Never Used  Substance Use Topics   Alcohol use: Not Currently    Alcohol/week: 0.0 standard drinks of alcohol   Drug use: No    Review of symptoms:  Constitutional:  Negative for unexplained weight loss, night sweats, fever, chills ENT:  Negative for nose bleeds, sinus pain, painful swallowing CV:  Negative for chest pain, shortness of breath, exercise intolerance, palpitations, loss of  consciousness Resp:  Negative for cough, wheezing, shortness of breath GI:  Negative for nausea, vomiting, diarrhea, bloody stools GU:  Positives noted in HPI; otherwise negative for gross hematuria, urinary incontinence Neuro:  Negative for seizures, poor balance, limb weakness, slurred speech Psych:  Negative for lack of energy, depression, anxiety Endocrine:  Negative for polydipsia, polyuria, symptoms of hypoglycemia (dizziness, hunger, sweating) Hematologic:  Negative for anemia, purpura, petechia, prolonged or excessive bleeding, use of anticoagulants  Allergic:  Negative for difficulty breathing or choking as a result of exposure to  anything; no shellfish allergy; no allergic response (rash/itch) to materials, foods  Physical exam: BP 135/85   Pulse 94   Ht 5' 4 (1.626 m)   Wt 145 lb (65.8 kg)   BMI 24.89 kg/m  GENERAL APPEARANCE:  Well appearing, well developed, well nourished, NAD HEENT: Atraumatic, Normocephalic, oropharynx clear. NECK: Supple without lymphadenopathy or thyromegaly. LUNGS: Clear to auscultation bilaterally. HEART: Regular Rate and Rhythm without murmurs, gallops, or rubs. ABDOMEN: Soft, non-tender, No Masses. EXTREMITIES: Moves all extremities well.  Without clubbing, cyanosis, or edema. NEUROLOGIC:  Alert and oriented x 3, normal gait, CN II-XII grossly intact.  MENTAL STATUS:  Appropriate. BACK:  Non-tender to palpation.  No CVAT SKIN:  Warm, dry and intact.   GU: Urethra: no mass or discharge; no leakage noted with cough or valsalva Vagina: atrophic changes, no significant prolapse  A chaperone was present during the examination.   Results: U/A: 0-5 WBCs, 0-2 RBCs  PVR = 0 ml

## 2024-09-02 DIAGNOSIS — Z7901 Long term (current) use of anticoagulants: Secondary | ICD-10-CM | POA: Diagnosis not present

## 2024-09-02 DIAGNOSIS — Z8249 Family history of ischemic heart disease and other diseases of the circulatory system: Secondary | ICD-10-CM | POA: Diagnosis not present

## 2024-09-02 DIAGNOSIS — N952 Postmenopausal atrophic vaginitis: Secondary | ICD-10-CM | POA: Diagnosis not present

## 2024-09-02 DIAGNOSIS — F419 Anxiety disorder, unspecified: Secondary | ICD-10-CM | POA: Diagnosis not present

## 2024-09-02 DIAGNOSIS — E785 Hyperlipidemia, unspecified: Secondary | ICD-10-CM | POA: Diagnosis not present

## 2024-09-02 DIAGNOSIS — D6869 Other thrombophilia: Secondary | ICD-10-CM | POA: Diagnosis not present

## 2024-09-02 DIAGNOSIS — Z9989 Dependence on other enabling machines and devices: Secondary | ICD-10-CM | POA: Diagnosis not present

## 2024-09-02 DIAGNOSIS — I10 Essential (primary) hypertension: Secondary | ICD-10-CM | POA: Diagnosis not present

## 2024-09-02 DIAGNOSIS — G4700 Insomnia, unspecified: Secondary | ICD-10-CM | POA: Diagnosis not present

## 2024-09-02 DIAGNOSIS — M1990 Unspecified osteoarthritis, unspecified site: Secondary | ICD-10-CM | POA: Diagnosis not present

## 2024-09-02 DIAGNOSIS — I4891 Unspecified atrial fibrillation: Secondary | ICD-10-CM | POA: Diagnosis not present

## 2024-09-10 ENCOUNTER — Ambulatory Visit: Admitting: Urgent Care

## 2024-09-10 ENCOUNTER — Encounter: Payer: Self-pay | Admitting: Urgent Care

## 2024-09-10 VITALS — BP 102/68 | HR 101 | Temp 98.0°F | Ht 64.0 in | Wt 143.0 lb

## 2024-09-10 DIAGNOSIS — R300 Dysuria: Secondary | ICD-10-CM | POA: Diagnosis not present

## 2024-09-10 LAB — POCT URINALYSIS DIP (CLINITEK)
Bilirubin, UA: NEGATIVE
Glucose, UA: NEGATIVE mg/dL
Ketones, POC UA: NEGATIVE mg/dL
Leukocytes, UA: NEGATIVE
Nitrite, UA: NEGATIVE
POC PROTEIN,UA: NEGATIVE
Spec Grav, UA: 1.02 (ref 1.010–1.025)
Urobilinogen, UA: 0.2 U/dL
pH, UA: 6 (ref 5.0–8.0)

## 2024-09-10 MED ORDER — AMOXICILLIN-POT CLAVULANATE 500-125 MG PO TABS: 1.0000 | ORAL_TABLET | Freq: Two times a day (BID) | ORAL | 0 refills | Status: DC

## 2024-09-10 NOTE — Progress Notes (Unsigned)
 Established Patient Office Visit  Subjective:  Patient ID: Belinda Owens, female    DOB: 10-25-1931  Age: 89 y.o. MRN: [id number]  Chief Complaint  Patient presents with  . Dysuria    Burns after urinating since yesterday    HPI  Patient Active Problem List   Diagnosis Date Noted  . Atrophic vaginitis 08/01/2024  . History of UTI 08/01/2024  . Dysuria 07/08/2024  . Recurrent UTI 07/08/2024  . Acute cystitis 12/26/2023  . Iron deficiency 09/19/2023  . Greater trochanteric bursitis, left 06/07/2023  . Atrial fibrillation (HCC) 03/05/2023  . Benign positional vertigo 02/26/2023  . Trigger thumb, right thumb 01/12/2023  . IFG (impaired fasting glucose) 03/13/2022  . History of TIA (transient ischemic attack) 01/10/2021  . Primary osteoarthritis of right wrist 10/02/2018  . De Quervain's tenosynovitis, right 10/02/2018  . CKD (chronic kidney disease) stage 2, GFR 60-89 ml/min 08/18/2018  . Myalgia due to statin 02/19/2018  . Adenoma of duodenum 04/10/2017  . Atypical ductal hyperplasia of left breast 12/01/2016  . Sedentary lifestyle 12/01/2016  . Class 1 drug-induced obesity without serious comorbidity with body mass index (BMI) of 31.0 to 31.9 in adult 12/01/2016  . At risk for breast cancer 12/01/2016  . Primary osteoarthritis of left knee 06/08/2016  . Osteopenia 09/29/2015  . Ear ringing sound 04/27/2015  . Bilateral shoulder pain 03/16/2015  . Degenerative disc disease, cervical 03/16/2015  . Insomnia 03/09/2015  . Hyperlipidemia 02/03/2015  . Essential hypertension 02/03/2015  . DDD (degenerative disc disease), lumbar 02/03/2015  . GERD (gastroesophageal reflux disease) 02/03/2015  . Anxiety state 02/03/2015  . Fibrocystic breast changes of both breasts 02/03/2015  . Microscopic hematuria 02/03/2015   Past Medical History:  Diagnosis Date  . Fibrocystic breast changes of both breasts    Gets yearly mammo and US   . History of rheumatic fever   .  Hypertension    Past Surgical History:  Procedure Laterality Date  . BREAST BIOPSY    . CARDIOVERSION N/A 03/28/2023   Procedure: CARDIOVERSION;  Surgeon: Raford Riggs, MD;  Location: [facility] INVASIVE CV LAB;  Service: Cardiovascular;  Laterality: N/A;  . MENISCUS REPAIR Right   . RECONSTRUCTION OF EYELID Bilateral 12/14/2016  . ROTATOR CUFF REPAIR Right    Social History   Tobacco Use  . Smoking status: Former    Current packs/day: 0.00    Types: Cigarettes    Quit date: 10/23/1957    Years since quitting: 66.9  . Smokeless tobacco: Never  Vaping Use  . Vaping status: Never Used  Substance Use Topics  . Alcohol use: Not Currently    Alcohol/week: 0.0 standard drinks of alcohol  . Drug use: No      ROS: as noted in HPI  Objective:     BP 102/68   Pulse (!) 101   Temp 98 F (36.7 C) (Oral)   Ht 5' 4 (1.626 m)   Wt 143 lb (64.9 kg)   SpO2 96%   BMI 24.55 kg/m  BP Readings from Last 3 Encounters:  09/10/24 102/68  08/01/24 135/85  07/07/24 (!) 142/92   Wt Readings from Last 3 Encounters:  09/10/24 143 lb (64.9 kg)  08/01/24 145 lb (65.8 kg)  07/07/24 151 lb (68.5 kg)      Physical Exam   No results found for any visits on 09/10/24.  Last CBC Lab Results  Component Value Date   WBC 9.3 03/23/2023   HGB 14.3 03/23/2023   HCT 42.6 03/23/2023  MCV 88 03/23/2023   MCH 29.5 03/23/2023   RDW 14.0 03/23/2023   PLT 378 03/23/2023   Last metabolic panel Lab Results  Component Value Date   GLUCOSE 111 (H) 03/23/2023   NA 137 03/23/2023   K 3.9 03/23/2023   CL 96 03/23/2023   CO2 24 03/23/2023   BUN 22 03/23/2023   CREATININE 0.97 03/23/2023   EGFR 56 (L) 03/23/2023   CALCIUM  10.1 03/23/2023   PROT 7.1 02/23/2023   ALBUMIN 4.3 07/06/2016   BILITOT 0.5 02/23/2023   ALKPHOS 56 07/06/2016   AST 18 02/23/2023   ALT 13 02/23/2023   Last lipids Lab Results  Component Value Date   CHOL 206 (H) 02/23/2023   HDL 75 02/23/2023   LDLCALC 94  02/23/2023   TRIG 259 (H) 02/23/2023   CHOLHDL 2.7 02/23/2023   Last hemoglobin A1c Lab Results  Component Value Date   HGBA1C 5.8 (A) 03/12/2023   Last thyroid  functions Lab Results  Component Value Date   TSH 1.950 03/23/2023   Last vitamin D No results found for: 25OHVITD2, 25OHVITD3, VD25OH Last vitamin B12 and Folate Lab Results  Component Value Date   VITAMINB12 >2000 (H) 09/18/2023      The ASCVD Risk score (Arnett DK, et al., 2019) failed to calculate for the following reasons:   The 2019 ASCVD risk score is only valid for ages 72 to 80  Assessment & Plan:  Dysuria     No follow-ups on file.   Benton LITTIE Gave, PA

## 2024-09-10 NOTE — Patient Instructions (Signed)
Your symptoms are consistent with a urinary tract infection. Start taking the antibiotic twice daily until completed. You can take Pyridium up to 3 times daily for maximum of 2 days, this is only to help with the frequency and the pain.  Take on an as-needed basis. We will send out your urine culture and only call if a change in treatment is necessary. Monitor for any change in or worsening symptoms; fever, hematuria or flank pain would warrant a recheck

## 2024-09-12 ENCOUNTER — Ambulatory Visit: Payer: Self-pay | Admitting: Urgent Care

## 2024-09-12 LAB — URINE CULTURE

## 2024-09-15 ENCOUNTER — Other Ambulatory Visit: Payer: Self-pay | Admitting: Family Medicine

## 2024-09-15 DIAGNOSIS — E876 Hypokalemia: Secondary | ICD-10-CM

## 2024-09-15 DIAGNOSIS — I10 Essential (primary) hypertension: Secondary | ICD-10-CM

## 2024-09-17 ENCOUNTER — Ambulatory Visit: Admitting: Family Medicine

## 2024-09-17 ENCOUNTER — Ambulatory Visit: Admitting: Urology

## 2024-09-22 ENCOUNTER — Ambulatory Visit: Payer: Self-pay | Admitting: *Deleted

## 2024-09-22 NOTE — Telephone Encounter (Signed)
 FYI Only or Action Required?: FYI only for provider: appointment scheduled on 12/2.  Patient was last seen in primary care on 09/10/2024 by Lowella Benton CROME, PA.  Called Nurse Triage reporting Dysuria.  Symptoms began yesterday.  Interventions attempted: Nothing.  Symptoms are: gradually worsening.  Triage Disposition: See Physician Within 24 Hours  Patient/caregiver understands and will follow disposition?: Yes   Copied from CRM #[id number]. Topic: Clinical - Red Word Triage >> Sep 22, 2024 10:23 AM Mercer PEDLAR wrote: Red Word that prompted transfer to Nurse Triage: Patient is having burning with urination. Reason for Disposition  Age > 50 years  Answer Assessment - Initial Assessment Questions 1. SEVERITY: How bad is the pain?  (e.g., Scale 1-10; mild, moderate, or severe)     5-6/10 2. FREQUENCY: How many times have you had painful urination today?      yes 3. PATTERN: Is pain present every time you urinate or just sometimes?      Yes- mostly at end of stream 4. ONSET: When did the painful urination start?      yesterday 5. FEVER: Do you have a fever? If Yes, ask: What is your temperature, how was it measured, and when did it start?     no 6. PAST UTI: Have you had a urine infection before? If Yes, ask: When was the last time? and What happened that time?      Over the years- bo a while 7. CAUSE: What do you think is causing the painful urination?  (e.g., UTI, scratch, Herpes sore)     UTI 8. OTHER SYMPTOMS: Do you have any other symptoms? (e.g., blood in urine, flank pain, genital sores, urgency, vaginal discharge)     No- occasional pain L side of bladder  Protocols used: Urination Pain - Female-A-AH

## 2024-09-23 ENCOUNTER — Ambulatory Visit: Admitting: Family Medicine

## 2024-09-23 ENCOUNTER — Ambulatory Visit (INDEPENDENT_AMBULATORY_CARE_PROVIDER_SITE_OTHER): Admitting: Family Medicine

## 2024-09-23 ENCOUNTER — Encounter: Payer: Self-pay | Admitting: Family Medicine

## 2024-09-23 ENCOUNTER — Ambulatory Visit: Payer: Self-pay

## 2024-09-23 VITALS — BP 127/77 | HR 86 | Ht 64.0 in | Wt 144.0 lb

## 2024-09-23 DIAGNOSIS — Z23 Encounter for immunization: Secondary | ICD-10-CM | POA: Diagnosis not present

## 2024-09-23 DIAGNOSIS — I4891 Unspecified atrial fibrillation: Secondary | ICD-10-CM | POA: Diagnosis not present

## 2024-09-23 DIAGNOSIS — R7301 Impaired fasting glucose: Secondary | ICD-10-CM

## 2024-09-23 DIAGNOSIS — R350 Frequency of micturition: Secondary | ICD-10-CM | POA: Diagnosis not present

## 2024-09-23 DIAGNOSIS — R102 Pelvic and perineal pain unspecified side: Secondary | ICD-10-CM

## 2024-09-23 DIAGNOSIS — I10 Essential (primary) hypertension: Secondary | ICD-10-CM | POA: Diagnosis not present

## 2024-09-23 DIAGNOSIS — R413 Other amnesia: Secondary | ICD-10-CM | POA: Insufficient documentation

## 2024-09-23 LAB — POCT URINALYSIS DIP (CLINITEK)
Bilirubin, UA: NEGATIVE
Glucose, UA: NEGATIVE mg/dL
Ketones, POC UA: NEGATIVE mg/dL
Leukocytes, UA: NEGATIVE
Nitrite, UA: NEGATIVE
POC PROTEIN,UA: NEGATIVE
Spec Grav, UA: 1.01 (ref 1.010–1.025)
Urobilinogen, UA: 0.2 U/dL
pH, UA: 6 (ref 5.0–8.0)

## 2024-09-23 NOTE — Telephone Encounter (Signed)
 Patient scheduled today 09/23/2024 with Dr. Alvan

## 2024-09-23 NOTE — Patient Instructions (Signed)
 Please get your Tetanus (Tdap) vaccine done at the pharmacy.

## 2024-09-23 NOTE — Progress Notes (Signed)
 Acute Office Visit  Patient ID: Belinda Owens, female    DOB: Mar 20, 1932, 89 y.o.   MRN: [id number]  PCP: Belinda Belinda BIRCH, MD  Chief Complaint  Patient presents with   urninary frequency    Subjective:     HPI  Discussed the use of AI scribe software for clinical note transcription with the patient, who gave verbal consent to proceed.  History of Present Illness Belinda Owens is an 89 year old female who presents for follow-up on estrogen cream treatment for urinary symptoms.  Urinary symptoms - Ongoing urinary burning and discomfort, particularly when sitting - Symptoms are more pronounced on the left side - Symptoms persist despite use of estrogen cream - Temporary relief previously achieved with lidocaine  jelly, though current availability at home is uncertain  Response to treatment - Estrogen cream applied daily initially, then reduced to three times a week - Symptoms have not resolved with estrogen cream use  Impact on activities of daily living - Symptoms are more noticeable when sitting - Standing for long periods is not feasible due to decreased activity levels - Spends most of her time sitting due to reduced mobility compared to the past   ROS     Objective:    BP 127/77   Pulse 86   Ht 5' 4 (1.626 m)   Wt 144 lb (65.3 kg)   SpO2 96%   BMI 24.72 kg/m    Physical Exam Vitals reviewed.  Constitutional:      Appearance: Normal appearance.  HENT:     Head: Normocephalic.  Pulmonary:     Effort: Pulmonary effort is normal.  Neurological:     Mental Status: She is alert and oriented to person, place, and time.  Psychiatric:        Mood and Affect: Mood normal.        Behavior: Behavior normal.       No results found for any visits on 09/23/24.     Assessment & Plan:   Problem List Items Addressed This Visit       Cardiovascular and Mediastinum   Essential hypertension   Relevant Orders   CMP14+EGFR   Lipid panel    Hemoglobin A1c   CBC   Atrial fibrillation (HCC)   Relevant Orders   CMP14+EGFR   Lipid panel   Hemoglobin A1c   CBC     Endocrine   IFG (impaired fasting glucose)   Relevant Orders   CMP14+EGFR   Lipid panel   Hemoglobin A1c   CBC     Other   Memory impairment   Would like for her to return for memory testing as she didn't remember going to Urology       Other Visit Diagnoses       Frequency of urination    -  Primary   Relevant Orders   POCT URINALYSIS DIP (CLINITEK)   Urine Culture     Encounter for immunization       Relevant Orders   Flu vaccine HIGH DOSE PF(Fluzone Trivalent) (Completed)     Pelvic pain       Relevant Orders   CT PELVIS W WO CONTRAST       Assessment and Plan Assessment & Plan Urinary frequency and burning Persistent urinary symptoms with burning, no infection, likely atrophic urethritis due to postmenopausal changes. - Continue estrogen cream three times a week. - Schedule follow-up with urologist Dr. Roseann. - Consider lidocaine  jelly for burning relief. -  Consider CT scan of pelvis if symptoms persist and are unilateral.  Essential hypertension Blood pressure well-controlled.  Impaired fasting glucose (prediabetes) Monitoring required to assess diabetes progression. - Ordered A1c test.  Atrial fibrillation Managed with Eliquis  and metoprolol . - Continue Eliquis  and metoprolol .  General Health Maintenance Tetanus vaccination due. - Advised tetanus vaccination at pharmacy.    No orders of the defined types were placed in this encounter.   No follow-ups on file.  Belinda Byars, MD [facility] at [facility]

## 2024-09-23 NOTE — Assessment & Plan Note (Signed)
 Would like for her to return for memory testing as she didn't remember going to Urology

## 2024-09-23 NOTE — Telephone Encounter (Signed)
 FYI Only or Action Required?: FYI only for provider: appointment scheduled on 09/23/24.  Patient was last seen in primary care on 09/10/2024 by Lowella Benton CROME, PA.  Called Nurse Triage reporting Urinary Frequency.  Symptoms began yesterday.  Interventions attempted: Nothing.  Symptoms are: stable.  Triage Disposition: Information or Advice Only Call  Patient/caregiver understands and will follow disposition?: Yes Reason for Disposition  General information question, no triage required and triager able to answer question  Answer Assessment - Initial Assessment Questions Patient denies fever, flank pain, hematuria.   1. REASON FOR CALL: What is the main reason for your call? or How can I best help you?     Reschedule appointment. Patient was triaged yesterday for urinary frequency and dysuria, she reports no symptoms now and wants to reschedule. Educated patient on importance of being seen to prevent worsening condition. Rescheduled patient for this afternoon with PCP.  2. SYMPTOMS : Do you have any symptoms?      Mild frequency and dysuria  Protocols used: Information Only Call - No Triage-A-AH  Copied from CRM [id number]. Topic: Clinical - Red Word Triage >> Sep 23, 2024  8:13 AM Antwanette L wrote: Red Word that prompted transfer to Nurse Triage: Pt is reporting urinary pain

## 2024-09-25 ENCOUNTER — Ambulatory Visit: Payer: Self-pay | Admitting: Family Medicine

## 2024-09-25 DIAGNOSIS — R7989 Other specified abnormal findings of blood chemistry: Secondary | ICD-10-CM

## 2024-09-25 DIAGNOSIS — R933 Abnormal findings on diagnostic imaging of other parts of digestive tract: Secondary | ICD-10-CM

## 2024-09-25 LAB — URINE CULTURE

## 2024-09-25 NOTE — Progress Notes (Signed)
 Hi Nera, no UTI, culture is negative

## 2024-09-27 DIAGNOSIS — R300 Dysuria: Secondary | ICD-10-CM | POA: Diagnosis not present

## 2024-09-28 ENCOUNTER — Other Ambulatory Visit: Payer: Self-pay | Admitting: Family Medicine

## 2024-09-28 DIAGNOSIS — I10 Essential (primary) hypertension: Secondary | ICD-10-CM

## 2024-09-28 DIAGNOSIS — E876 Hypokalemia: Secondary | ICD-10-CM

## 2024-09-29 ENCOUNTER — Other Ambulatory Visit

## 2024-10-02 ENCOUNTER — Ambulatory Visit: Payer: Self-pay

## 2024-10-02 NOTE — Telephone Encounter (Signed)
 FYI Only or Action Required?: FYI only for provider: UC advised.  Patient was last seen in primary care on 09/23/2024 by Alvan Dorothyann BIRCH, MD.  Called Nurse Triage reporting Dysuria.  Symptoms began several days ago.  Interventions attempted: OTC medications: Azo.  Symptoms are: unchanged.  Triage Disposition: See Physician Within 24 Hours  Patient/caregiver understands and will follow disposition?: YesCopied from CRM #[id number]. Topic: Clinical - Red Word Triage >> Oct 02, 2024  3:11 PM Anairis L wrote: [facility] that prompted transfer to Nurse Triage: Burning when using the restroom, bubbles in urine. Feels like urethra is burning. Reason for Disposition  Urinating more frequently than usual (i.e., frequency) OR new-onset of the feeling of an urgent need to urinate (i.e., urgency)  Answer Assessment - Initial Assessment Questions Patient taking azo. No appointments available for Dr Parnell until Jan 13. She does not want to go to another practice. She will go to [facility] and wants to go ahead and book January 13th appointment to follow up on this. 1. SYMPTOM: What's the main symptom you're concerned about? (e.g., frequency, incontinence)     Burning at the end of urinate, pain in low belly, bubbly and dark urine 2. ONSET: When did the  pain  start?     A few days ago 3. PAIN: Is there any pain? If Yes, ask: How bad is it? (Scale: 1-10; mild, moderate, severe)     4-5 4. CAUSE: What do you think is causing the symptoms?     UTI 5. OTHER SYMPTOMS: Do you have any other symptoms? (e.g., blood in urine, fever, flank pain, pain with urination)     Pain with urination. Denies fever, frequency, odor.  Protocols used: Urinary Symptoms-A-AH

## 2024-10-03 ENCOUNTER — Other Ambulatory Visit: Payer: Self-pay

## 2024-10-03 ENCOUNTER — Ambulatory Visit
Admission: EM | Admit: 2024-10-03 | Discharge: 2024-10-03 | Disposition: A | Discharge status: Home / Self Care | Attending: Physician Assistant | Admitting: Physician Assistant

## 2024-10-03 ENCOUNTER — Ambulatory Visit: Admitting: Medical-Surgical

## 2024-10-03 DIAGNOSIS — R300 Dysuria: Secondary | ICD-10-CM | POA: Diagnosis not present

## 2024-10-03 DIAGNOSIS — R102 Pelvic and perineal pain unspecified side: Secondary | ICD-10-CM

## 2024-10-03 LAB — POCT URINALYSIS DIP (MANUAL ENTRY)
Bilirubin, UA: NEGATIVE
Glucose, UA: 100 mg/dL — AB
Ketones, POC UA: NEGATIVE mg/dL
Leukocytes, UA: NEGATIVE
Nitrite, UA: POSITIVE — AB
Spec Grav, UA: <=1.005 — AB (ref 1.010–1.025)
Urobilinogen, UA: 1 U/dL
pH, UA: 5.5 (ref 5.0–8.0)

## 2024-10-03 NOTE — Telephone Encounter (Signed)
 Spoke with patient - states this is ongoing issue and she does not feel that she needs to go to Urgent care right now. There was not an appt showing for her in her chart. She did state that she did want the appt for January 13th for follow up for the burning after urination so we did scheduled this .

## 2024-10-03 NOTE — ED Notes (Signed)
Pt unable to urinate at this time, given water 

## 2024-10-03 NOTE — ED Triage Notes (Signed)
 X couple of days has had burning urethra after voiding (after about 10-15 mins), also reports vaginal area burning as well. No hematuria. Reports bubbles in urine. No fever. Has had tylenol, which does not really help.

## 2024-10-03 NOTE — ED Provider Notes (Signed)
 [facility]    CSN: [id number] Arrival date & time: 10/03/24  1555      History   Chief Complaint Chief Complaint  Patient presents with   Dysuria    HPI Belinda Owens is a 89 y.o. female.  has a past medical history of Fibrocystic breast changes of both breasts, History of rheumatic fever, and Hypertension.   HPI  Discussed the use of AI scribe software for clinical note transcription with the patient, who gave verbal consent to proceed.  The patient presents with a burning sensation in the ureter after urination.  She experiences a burning sensation in the ureter approximately ten minutes after urination, which persists. This symptom has been present since the previous day. She notes a thin discharge over the past couple of days.  No redness, irritation, abdominal pain, flank pain, fever, chills, vaginal pain, or bleeding. No difficulty urinating, such as needing to push or strain, and no confusion or disorientation.  A review of recent labs from early December showed trace nitrites and blood in the urine, but a negative culture.  She is currently using Premarin  cream at night but admits she is not using it regularly   Past Medical History:  Diagnosis Date   Fibrocystic breast changes of both breasts    Gets yearly mammo and US    History of rheumatic fever    Hypertension     Patient Active Problem List   Diagnosis Date Noted   Memory impairment 09/23/2024   Atrophic vaginitis 08/01/2024   History of UTI 08/01/2024   Dysuria 07/08/2024   Recurrent UTI 07/08/2024   Acute cystitis 12/26/2023   Iron deficiency 09/19/2023   Greater trochanteric bursitis, left 06/07/2023   Atrial fibrillation (HCC) 03/05/2023   Benign positional vertigo 02/26/2023   Trigger thumb, right thumb 01/12/2023   IFG (impaired fasting glucose) 03/13/2022   History of TIA (transient ischemic attack) 01/10/2021   Primary osteoarthritis of right wrist 10/02/2018   De  Quervain's tenosynovitis, right 10/02/2018   CKD (chronic kidney disease) stage 2, GFR 60-89 ml/min 08/18/2018   Myalgia due to statin 02/19/2018   Adenoma of duodenum 04/10/2017   Atypical ductal hyperplasia of left breast 12/01/2016   Sedentary lifestyle 12/01/2016   Class 1 drug-induced obesity without serious comorbidity with body mass index (BMI) of 31.0 to 31.9 in adult 12/01/2016   At risk for breast cancer 12/01/2016   Primary osteoarthritis of left knee 06/08/2016   Osteopenia 09/29/2015   Ear ringing sound 04/27/2015   Bilateral shoulder pain 03/16/2015   Degenerative disc disease, cervical 03/16/2015   Insomnia 03/09/2015   Hyperlipidemia 02/03/2015   Essential hypertension 02/03/2015   DDD (degenerative disc disease), lumbar 02/03/2015   GERD (gastroesophageal reflux disease) 02/03/2015   Anxiety state 02/03/2015   Fibrocystic breast changes of both breasts 02/03/2015   Microscopic hematuria 02/03/2015    Past Surgical History:  Procedure Laterality Date   BREAST BIOPSY     CARDIOVERSION N/A 03/28/2023   Procedure: CARDIOVERSION;  Surgeon: Raford Riggs, MD;  Location: [facility] INVASIVE CV LAB;  Service: Cardiovascular;  Laterality: N/A;   MENISCUS REPAIR Right    RECONSTRUCTION OF EYELID Bilateral 12/14/2016   ROTATOR CUFF REPAIR Right     OB History     Gravida  3   Para  3   Term  3   Preterm      AB      Living         SAB  IAB      Ectopic      Multiple      Live Births  3            Home Medications    Prior to Admission medications  Medication Sig Start Date End Date Taking? Authorizing Provider  amLODipine  (NORVASC ) 5 MG tablet TAKE 1 TABLET BY MOUTH DAILY 01/15/23   Alvan Dorothyann BIRCH, MD  conjugated estrogens  (PREMARIN ) vaginal cream Apply pea sized amount nightly to the urethra and outer vaginal area nightly for 2 weeks and then 3 x a week. 08/22/23   Alvan Dorothyann BIRCH, MD  ELIQUIS  2.5 MG TABS tablet TAKE 1 TABLET BY  MOUTH 2 TIMES A DAY 07/09/24   Alvan Dorothyann BIRCH, MD  losartan -hydrochlorothiazide (HYZAAR) 100-12.5 MG tablet TAKE 1 TABLET BY MOUTH DAILY 06/10/24   Alvan Dorothyann BIRCH, MD  metoprolol  succinate (TOPROL -XL) 50 MG 24 hr tablet Take 1 tablet (50 mg total) by mouth daily. 06/17/24   Alvan Dorothyann BIRCH, MD  Multiple Vitamin (MULTIVITAMIN WITH MINERALS) TABS tablet Take 1 tablet by mouth in the morning. Centrum Silver for Women    [provider]  pravastatin  (PRAVACHOL ) 20 MG tablet TAKE 1 TABLET BY MOUTH AT BEDTIME 01/24/24   Alvan Dorothyann BIRCH, MD  sertraline  (ZOLOFT ) 25 MG tablet TAKE 1 TABLET BY MOUTH DAILY 09/17/24   Alvan Dorothyann BIRCH, MD  vitamin D3 (CHOLECALCIFEROL) 25 MCG tablet Take 1,000 Units by mouth in the morning.    [provider]  zolpidem  (AMBIEN ) 5 MG tablet TAKE A 1/2 - 1 TABLET BY MOUTH EVERY NIGHT AT BEDTIME AS NEEDED FOR SLEEP 10/01/24   Alvan Dorothyann BIRCH, MD    Family History Family History  Problem Relation Age of Onset   Breast cancer Maternal Aunt    Colon cancer Father    Asthma Daughter    Heart disease Mother    Uterine cancer Mother    Cancer - Other Brother     Social History Social History[1]   Allergies   Atorvastatin , Omeprazole, Tylenol with codeine #3  [acetaminophen-codeine], Atrovent  nasal spray [ipratropium], and Sulfa antibiotics   Review of Systems Review of Systems  Constitutional:  Negative for chills and fever.  Gastrointestinal:  Negative for abdominal pain.  Genitourinary:  Positive for dysuria and vaginal discharge. Negative for difficulty urinating, frequency, vaginal bleeding and vaginal pain.     Physical Exam Triage Vital Signs ED Triage Vitals  Encounter Vitals Group     BP 10/03/24 1608 129/85     Girls Systolic BP Percentile --      Girls Diastolic BP Percentile --      Boys Systolic BP Percentile --      Boys Diastolic BP Percentile --      Pulse Rate 10/03/24 1608 92     Resp  10/03/24 1608 16     Temp 10/03/24 1608 (!) 97.5 F (36.4 C)     Temp src --      SpO2 10/03/24 1608 96 %     Weight 10/03/24 1615 155 lb (70.3 kg)     Height 10/03/24 1615 5' 4 (1.626 m)     Head Circumference --      Peak Flow --      Pain Score 10/03/24 1615 4     Pain Loc --      Pain Education --      Exclude from Growth Chart --    No data found.  Updated Vital Signs  BP 129/85   Pulse 92   Temp (!) 97.5 F (36.4 C)   Resp 16   Ht 5' 4 (1.626 m)   Wt 155 lb (70.3 kg)   SpO2 96%   BMI 26.61 kg/m   Visual Acuity Right Eye Distance:   Left Eye Distance:   Bilateral Distance:    Right Eye Near:   Left Eye Near:    Bilateral Near:     Physical Exam Vitals reviewed.  Constitutional:      General: She is awake.     Appearance: Normal appearance. She is well-developed and well-groomed.  HENT:     Head: Normocephalic and atraumatic.  Eyes:     General: Lids are normal. Gaze aligned appropriately.     Extraocular Movements: Extraocular movements intact.     Conjunctiva/sclera: Conjunctivae normal.  Pulmonary:     Effort: Pulmonary effort is normal.  Neurological:     Mental Status: She is alert and oriented to person, place, and time.  Psychiatric:        Attention and Perception: Attention and perception normal.        Mood and Affect: Mood and affect normal.        Speech: Speech normal.        Behavior: Behavior normal. Behavior is cooperative.      UC Treatments / Results  Labs (all labs ordered are listed, but only abnormal results are displayed) Labs Reviewed  POCT URINALYSIS DIP (MANUAL ENTRY) - Abnormal; Notable for the following components:      Result Value   Color, UA other (*)    Clarity, UA cloudy (*)    Glucose, UA =100 (*)    Spec Grav, UA <=1.005 (*)    Blood, UA trace-intact (*)    Protein Ur, POC trace (*)    Nitrite, UA Positive (*)    All other components within normal limits  URINE CULTURE  CERVICOVAGINAL ANCILLARY ONLY     EKG   Radiology No results found.  Procedures Procedures (including critical care time)  Medications Ordered in UC Medications - No data to display  Initial Impression / Assessment and Plan / UC Course  I have reviewed the triage vital signs and the nursing notes.  Pertinent labs & imaging results that were available during my care of the patient were reviewed by me and considered in my medical decision making (see chart for details).     Patient was seen at [facility] urgent care on 09/27/2024 for dysuria as well as disorientation.  Urine dip at that time showed small amount of blood as well as cloudy appearance but leukocyte and nitrite negative.  Urine culture was negative as well with results finalized on 09/29/2024.  Patient was started on Macrobid  as well as Pyridium  at that time.  Final Clinical Impressions(s) / UC Diagnoses   Final diagnoses:  Dysuria  Vulvovaginal discomfort   Vulvovaginal symptoms: evaluation for infection and atrophic vaginitis Intermittent burning sensation in the ureter post-urination, with associated thin discharge. Symptoms have persisted since yesterday. Urinalysis shows trace nitrites and blood, but previous culture was negative. Differential diagnosis includes vulvovaginal infection (yeast infection, bacterial vaginosis) or atrophic vaginitis. Current symptoms and negative culture suggest non-UTI etiology. Swab results pending to rule out infections. If negative, consider atrophic vaginitis as a likely cause. - Sent urine sample for culture. - Performed swab to test for bacterial vaginosis, yeast, trichomonas, gonorrhea, and chlamydia. - If swab and culture results are negative, recommend follow-up  with primary care or OB GYN for evaluation of atrophic vaginitis. - If atrophic vaginitis is confirmed, recommend more consistent use of Premarin  cream.    Discharge Instructions      VISIT SUMMARY:  You came in today because you have been  experiencing a burning sensation in your ureter after urination, along with a thin discharge. These symptoms started yesterday. You do not have any other symptoms like redness, irritation, abdominal pain, or fever.  YOUR PLAN:  -VULVOVAGINAL SYMPTOMS: You have been experiencing a burning sensation in your ureter after urination and a thin discharge. This could be due to an infection or atrophic vaginitis, which is a condition where the vaginal walls become thin and dry. We have sent a urine sample for culture and performed a swab to test for various infections. If the results are negative, we may consider atrophic vaginitis as the cause. If confirmed, you should use Premarin  cream more consistently.  INSTRUCTIONS:  We will contact you with the results of your urine culture and swab tests. If the results are negative, please follow up with your primary care doctor or OB GYN to evaluate for atrophic vaginitis.     ED Prescriptions   None    PDMP not reviewed this encounter.     [1]  Social History Tobacco Use   Smoking status: Former    Current packs/day: 0.00    Types: Cigarettes    Quit date: 10/23/1957    Years since quitting: 66.9   Smokeless tobacco: Never  Vaping Use   Vaping status: Never Used  Substance Use Topics   Alcohol use: Not Currently    Alcohol/week: 0.0 standard drinks of alcohol   Drug use: No     Juli Odom, [place] BRAVO, PA-C 10/03/24 1719

## 2024-10-03 NOTE — Discharge Instructions (Signed)
 VISIT SUMMARY:  You came in today because you have been experiencing a burning sensation in your ureter after urination, along with a thin discharge. These symptoms started yesterday. You do not have any other symptoms like redness, irritation, abdominal pain, or fever.  YOUR PLAN:  -VULVOVAGINAL SYMPTOMS: You have been experiencing a burning sensation in your ureter after urination and a thin discharge. This could be due to an infection or atrophic vaginitis, which is a condition where the vaginal walls become thin and dry. We have sent a urine sample for culture and performed a swab to test for various infections. If the results are negative, we may consider atrophic vaginitis as the cause. If confirmed, you should use Premarin  cream more consistently.  INSTRUCTIONS:  We will contact you with the results of your urine culture and swab tests. If the results are negative, please follow up with your primary care doctor or OB GYN to evaluate for atrophic vaginitis.

## 2024-10-06 ENCOUNTER — Ambulatory Visit (HOSPITAL_COMMUNITY): Payer: Self-pay

## 2024-10-06 LAB — URINE CULTURE: Culture: NO GROWTH

## 2024-10-06 LAB — CERVICOVAGINAL ANCILLARY ONLY
Bacterial Vaginitis (gardnerella): NEGATIVE
Candida Glabrata: POSITIVE — AB
Candida Vaginitis: NEGATIVE
Chlamydia: NEGATIVE
Comment: NEGATIVE
Comment: NEGATIVE
Comment: NEGATIVE
Comment: NEGATIVE
Comment: NEGATIVE
Comment: NORMAL
Neisseria Gonorrhea: NEGATIVE
Trichomonas: NEGATIVE

## 2024-10-07 ENCOUNTER — Ambulatory Visit: Payer: Self-pay

## 2024-10-07 MED ORDER — BORIC ACID 600 MG VA SUPP: 1.0000 | Freq: Every evening | VAGINAL | 0 refills | Status: AC

## 2024-10-07 NOTE — Telephone Encounter (Signed)
 Candida glabrata on cytology, boric acid suppository nightly for 2-3 weeks then ON/GYN follow-up.

## 2024-10-07 NOTE — Telephone Encounter (Signed)
 FYI Only or Action Required?: FYI only for provider: UC prescribed Boric Acid Suppository for swab results.  Patient was last seen in primary care on 09/23/2024 by Alvan Dorothyann BIRCH, MD.  Called Nurse Triage reporting Dysuria.  Symptoms began a week ago.  Interventions attempted: Rest, hydration, or home remedies.  Symptoms are: stable.  Triage Disposition: No disposition on file.  Patient/caregiver understands and will follow disposition?:   Copied from CRM #[id number]. Topic: Clinical - Red Word Triage >> Oct 07, 2024  3:29 PM Fonda T wrote: [facility] that prompted transfer to Nurse Triage: Pt calling state she is having burning and pain with urination around urethra, and notices after urination, toilet is full with bubbles.  Pt is requesting to speak to nurse regarding her concerns. States she was recently seen in office, wants to know if she needs an appt for further evaluation. Answer Assessment - Initial Assessment Questions 1. TEST: What kind of urine test was performed? (e.g., urinalysis, urine dipstick)      UA/UC 2. URINALYSIS RESULT: Was it positive or negative?  If positive, document what was positive (e.g., LE, nitrites, WBC, RBC, bacteria, epithelial cells)     UC- negative  Cervicovaginal swab- positive for Candida Glabrata- boric acid suppository sent in by [facility] practitioner. Reviewed with patient. She will start tonight.  Symptoms consistent with UC visit. Maybe slight worsening of left bladder discomfort. Advised to CB if suppository not beneficial after a few days.  Protocols used: Urinalysis Results Follow-up Call-A-AH

## 2024-10-08 ENCOUNTER — Telehealth: Payer: Self-pay

## 2024-10-08 NOTE — Telephone Encounter (Signed)
 What kind of suppositories?  Boric acid. They are over th counter.  She may hve to look at [employer], etc  Is she using the estrogen cream?

## 2024-10-08 NOTE — Telephone Encounter (Signed)
 Copied from CRM #[id number]. Topic: Clinical - Medical Advice >> Oct 08, 2024  3:06 PM Victoria B wrote: Reason for CRM: patient called in spoke with NT earlier, states was told to get vaginal supositories from drug store but they don't have it. She is asking for alternatives for burning with urinary burning

## 2024-10-09 NOTE — Telephone Encounter (Signed)
 Spoke with patient. States the suppositories are Boric acid. She will just order this online as her pharmacy did not have this in stock.  She is still using the estrogen cream but does not feel that this makes much of  a difference as she is still burning after urination.

## 2024-10-09 NOTE — Telephone Encounter (Signed)
 Attempted call to patient. Left a voice mail requesting a return call. Main phone # given for return call.

## 2024-10-13 ENCOUNTER — Ambulatory Visit

## 2024-10-13 DIAGNOSIS — R102 Pelvic and perineal pain unspecified side: Secondary | ICD-10-CM

## 2024-10-13 MED ORDER — IOHEXOL 300 MG/ML  SOLN
100.0000 mL | Freq: Once | INTRAMUSCULAR | Status: AC | PRN
  Administered 2024-10-13: 100 mL via INTRAVENOUS

## 2024-10-22 ENCOUNTER — Ambulatory Visit: Admitting: Family Medicine

## 2024-10-22 ENCOUNTER — Other Ambulatory Visit: Payer: Self-pay | Admitting: Family Medicine

## 2024-10-22 ENCOUNTER — Ambulatory Visit: Payer: Self-pay

## 2024-10-22 DIAGNOSIS — I4891 Unspecified atrial fibrillation: Secondary | ICD-10-CM

## 2024-10-22 NOTE — Telephone Encounter (Signed)
 FYI Only or Action Required?: FYI only for provider: UC advised.  Patient was last seen in primary care on 09/23/2024 by Alvan Dorothyann BIRCH, MD.  Called Nurse Triage reporting Dysuria.  Symptoms began several days ago.  Interventions attempted: Nothing.  Symptoms are: stable.  Triage Disposition: See Physician Within 24 Hours  Patient/caregiver understands and will follow disposition?: Yes Reason for Disposition  Age > 50 years  Answer Assessment - Initial Assessment Questions Patient over slept and missed appointment, advised no appointments available, advised UC. Patient agreeable  1. SEVERITY: How bad is the pain?  (e.g., Scale 1-10; mild, moderate, or severe)     5/10  2. PATTERN: Is pain present every time you urinate or just sometimes?      Yes  3. ONSET: When did the painful urination start?      Few days ago, but off and on this month  4. FEVER: Do you have a fever? If Yes, ask: What is your temperature, how was it measured, and when did it start?     Denies  5. PAST UTI: Have you had a urine infection before? If Yes, ask: When was the last time? and What happened that time?      Not recently   6. CAUSE: What do you think is causing the painful urination?  (e.g., UTI, scratch, Herpes sore)     Unsure  7. OTHER SYMPTOMS: Do you have any other symptoms? (e.g., blood in urine, flank pain, genital sores, urgency, vaginal discharge)     Pain in pelvic area and left side, comes and goes. Denies any other symptoms  Protocols used: Urination Pain - Female-A-AH  Copied from CRM [id number]. Topic: Clinical - Red Word Triage >> Oct 22, 2024  9:24 AM Charlet HERO wrote: Red Word that prompted transfer to Nurse Triage: Patient is calling bc she has burning after she urinates, she had appt for today for 8:30 but she just woke up. [place]

## 2024-10-28 ENCOUNTER — Encounter: Payer: Self-pay | Admitting: Emergency Medicine

## 2024-10-28 ENCOUNTER — Ambulatory Visit
Admission: EM | Admit: 2024-10-28 | Discharge: 2024-10-28 | Disposition: A | Discharge status: Home / Self Care | Attending: Family Medicine | Admitting: Family Medicine

## 2024-10-28 DIAGNOSIS — R300 Dysuria: Secondary | ICD-10-CM | POA: Diagnosis present

## 2024-10-28 LAB — POCT URINALYSIS DIP (MANUAL ENTRY)
Bilirubin, UA: NEGATIVE
Glucose, UA: NEGATIVE mg/dL
Ketones, POC UA: NEGATIVE mg/dL
Leukocytes, UA: NEGATIVE
Nitrite, UA: NEGATIVE
Protein Ur, POC: NEGATIVE mg/dL
Spec Grav, UA: <=1.005 — AB
Urobilinogen, UA: 0.2 U/dL
pH, UA: 6.5

## 2024-10-28 NOTE — ED Provider Notes (Signed)
 " [facility]    CSN: [id number] Arrival date & time: 10/28/24  1542      History   Chief Complaint Chief Complaint  Patient presents with   Dysuria    HPI Belinda Owens is a 89 y.o. female.   Patient has a diagnosis in her chart of frequent UTI, dysuria, atrophic vaginitis, and more recently Candida glabrata vaginitis.  I reviewed her lab results and it appears her last 4 or 5 urine cultures have been negative for urine infection and positive for multiple flora.  She states she is using her Premarin  cream.  She does not remember that she came to this office last month.  She does not remember that she was diagnosed with a yeast infection.  She does not remember that there was any additional treatment for this.  She does not complain of any vaginal itching or vaginal discomfort on this date.  States that her urethra burns when she urinates.  She denies any fever or chills, flank pain or abdominal pain.    Past Medical History:  Diagnosis Date   Fibrocystic breast changes of both breasts    Gets yearly mammo and US    History of rheumatic fever    Hypertension     Patient Active Problem List   Diagnosis Date Noted   Memory impairment 09/23/2024   Atrophic vaginitis 08/01/2024   History of UTI 08/01/2024   Dysuria 07/08/2024   Recurrent UTI 07/08/2024   Acute cystitis 12/26/2023   Iron deficiency 09/19/2023   Greater trochanteric bursitis, left 06/07/2023   Atrial fibrillation (HCC) 03/05/2023   Benign positional vertigo 02/26/2023   Trigger thumb, right thumb 01/12/2023   IFG (impaired fasting glucose) 03/13/2022   History of TIA (transient ischemic attack) 01/10/2021   Primary osteoarthritis of right wrist 10/02/2018   De Quervain's tenosynovitis, right 10/02/2018   CKD (chronic kidney disease) stage 2, GFR 60-89 ml/min 08/18/2018   Myalgia due to statin 02/19/2018   Adenoma of duodenum 04/10/2017   Atypical ductal hyperplasia of left breast 12/01/2016    Sedentary lifestyle 12/01/2016   Class 1 drug-induced obesity without serious comorbidity with body mass index (BMI) of 31.0 to 31.9 in adult 12/01/2016   At risk for breast cancer 12/01/2016   Primary osteoarthritis of left knee 06/08/2016   Osteopenia 09/29/2015   Ear ringing sound 04/27/2015   Bilateral shoulder pain 03/16/2015   Degenerative disc disease, cervical 03/16/2015   Insomnia 03/09/2015   Hyperlipidemia 02/03/2015   Essential hypertension 02/03/2015   DDD (degenerative disc disease), lumbar 02/03/2015   GERD (gastroesophageal reflux disease) 02/03/2015   Anxiety state 02/03/2015   Fibrocystic breast changes of both breasts 02/03/2015   Microscopic hematuria 02/03/2015    Past Surgical History:  Procedure Laterality Date   BREAST BIOPSY     CARDIOVERSION N/A 03/28/2023   Procedure: CARDIOVERSION;  Surgeon: Raford Riggs, MD;  [facility]: [facility] INVASIVE CV LAB;  Service: Cardiovascular;  Laterality: N/A;   MENISCUS REPAIR Right    RECONSTRUCTION OF EYELID Bilateral 12/14/2016   ROTATOR CUFF REPAIR Right     OB History     Gravida  3   Para  3   Term  3   Preterm      AB      Living         SAB      IAB      Ectopic      Multiple      Live Births  3            Home Medications    Prior to Admission medications  Medication Sig Start Date End Date Taking? Authorizing Provider  amLODipine  (NORVASC ) 5 MG tablet TAKE 1 TABLET BY MOUTH DAILY 01/15/23   Metheney, Catherine D, MD  Boric Acid 600 MG SUPP Place 1 suppository vaginally at bedtime for 21 days. 10/07/24 10/28/24  Dreama, [place]  N, FNP  conjugated estrogens  (PREMARIN ) vaginal cream Apply pea sized amount nightly to the urethra and outer vaginal area nightly for 2 weeks and then 3 x a week. 08/22/23   Alvan Dorothyann BIRCH, MD  ELIQUIS  2.5 MG TABS tablet TAKE 1 TABLET BY MOUTH 2 TIMES A DAY 10/22/24   Alvan Dorothyann BIRCH, MD  losartan -hydrochlorothiazide (HYZAAR) 100-12.5 MG tablet TAKE  1 TABLET BY MOUTH DAILY 06/10/24   Alvan Dorothyann BIRCH, MD  metoprolol  succinate (TOPROL -XL) 50 MG 24 hr tablet Take 1 tablet (50 mg total) by mouth daily. 06/17/24   Alvan Dorothyann BIRCH, MD  Multiple Vitamin (MULTIVITAMIN WITH MINERALS) TABS tablet Take 1 tablet by mouth in the morning. Centrum Silver for Women    [provider]  pravastatin  (PRAVACHOL ) 20 MG tablet TAKE 1 TABLET BY MOUTH AT BEDTIME 01/24/24   Alvan Dorothyann BIRCH, MD  sertraline  (ZOLOFT ) 25 MG tablet TAKE 1 TABLET BY MOUTH DAILY 09/17/24   Alvan Dorothyann BIRCH, MD  vitamin D3 (CHOLECALCIFEROL) 25 MCG tablet Take 1,000 Units by mouth in the morning.    [provider]  zolpidem  (AMBIEN ) 5 MG tablet TAKE A 1/2 - 1 TABLET BY MOUTH EVERY NIGHT AT BEDTIME AS NEEDED FOR SLEEP 10/01/24   Alvan Dorothyann BIRCH, MD    Family History Family History  Problem Relation Age of Onset   Breast cancer Maternal Aunt    Colon cancer Father    Asthma Daughter    Heart disease Mother    Uterine cancer Mother    Cancer - Other Brother     Social History Social History[1]   Allergies   Atorvastatin , Omeprazole, Tylenol with codeine #3  [acetaminophen-codeine], Atrovent  nasal spray [ipratropium], and Sulfa antibiotics   Review of Systems Review of Systems  See HPI Physical Exam Triage Vital Signs ED Triage Vitals  Encounter Vitals Group     BP 10/28/24 1554 (!) 136/92     Girls Systolic BP Percentile --      Girls Diastolic BP Percentile --      Boys Systolic BP Percentile --      Boys Diastolic BP Percentile --      Pulse Rate 10/28/24 1554 89     Resp --      Temp 10/28/24 1554 97.9 F (36.6 C)     Temp Source 10/28/24 1554 Oral     SpO2 10/28/24 1554 98 %     Weight --      Height --      Head Circumference --      Peak Flow --      Pain Score 10/28/24 1555 0     Pain Loc --      Pain Education --      Exclude from Growth Chart --    No data found.  Updated Vital Signs BP (!) 136/92 (BP  Location: Right Arm)   Pulse 89   Temp 97.9 F (36.6 C) (Oral)   SpO2 98%    Physical Exam Constitutional:      General: She is not in acute distress.  Appearance: She is well-developed.  HENT:     Head: Normocephalic and atraumatic.  Eyes:     Conjunctiva/sclera: Conjunctivae normal.     Pupils: Pupils are equal, round, and reactive to light.  Cardiovascular:     Rate and Rhythm: Normal rate.  Pulmonary:     Effort: Pulmonary effort is normal. No respiratory distress.  Musculoskeletal:        General: Normal range of motion.     Cervical back: Normal range of motion.  Skin:    General: Skin is warm and dry.  Neurological:     Mental Status: She is alert.  Psychiatric:     Comments: Patient is alert.  Fluent conversation      UC Treatments / Results  Labs (all labs ordered are listed, but only abnormal results are displayed) Labs Reviewed  POCT URINALYSIS DIP (MANUAL ENTRY) - Abnormal; Notable for the following components:      Result Value   Spec Grav, UA <=1.005 (*)    Blood, UA trace-intact (*)    All other components within normal limits  URINE CULTURE    EKG   Radiology No results found.  Procedures Procedures (including critical care time)  Medications Ordered in UC Medications - No data to display  Initial Impression / Assessment and Plan / UC Course  I have reviewed the triage vital signs and the nursing notes.  Pertinent labs & imaging results that were available during my care of the patient were reviewed by me and considered in my medical decision making (see chart for details).     Patient has some documented memory impairment.  I am reluctant to give her antibiotics but I do not feel like she has a bladder infection on this date.  She will follow-up with her primary care doctor regarding her persistent dysuria. Final Clinical Impressions(s) / UC Diagnoses   Final diagnoses:  Dysuria     Discharge Instructions      I will send  your urine to the laboratory for analysis Continue to drink lots of water See Dr. Alvan next week as scheduled   ED Prescriptions   None    PDMP not reviewed this encounter.    [1]  Social History Tobacco Use   Smoking status: Former    Current packs/day: 0.00    Types: Cigarettes    Quit date: 10/23/1957    Years since quitting: 67.0   Smokeless tobacco: Never  Vaping Use   Vaping status: Never Used  Substance Use Topics   Alcohol use: Not Currently    Alcohol/week: 0.0 standard drinks of alcohol   Drug use: No     Maranda Jamee Jacob, MD 10/28/24 1653  "

## 2024-10-28 NOTE — Discharge Instructions (Signed)
 I will send your urine to the laboratory for analysis Continue to drink lots of water See Dr. Alvan next week as scheduled

## 2024-10-28 NOTE — ED Triage Notes (Signed)
 Pt c/o burning in urethra after voiding. States urinary frequency today. She has not taken anything today for this.

## 2024-10-30 ENCOUNTER — Ambulatory Visit (HOSPITAL_COMMUNITY): Payer: Self-pay

## 2024-10-30 ENCOUNTER — Telehealth: Payer: Self-pay

## 2024-10-30 LAB — URINE CULTURE: Culture: 20000 — AB

## 2024-10-30 NOTE — Progress Notes (Signed)
 Hi Belinda Owens, they said that there was some thickening on the scan that you had done of the cecum.  This is part of the colon.  They are concerned that it could indicate a growth or tumor.  So I would really like to get you in with gastroenterology to see what they think should be done next.  Do you have a GI doctor that you have seen in the last few years that we could get you back in with?  If not we can try to find someone here for you that is local [place].  There are 2 groups here that are fantastic. They also noted a little thickening in the lining of the uterus.  So they did recommend an ultrasound to follow that up and if you are okay with that we can try to get that scheduled as well.

## 2024-10-30 NOTE — Telephone Encounter (Signed)
 Patient informed that orders for this have not yet been placed. And that radiology dept would reach out to her for scheduling once this is done.

## 2024-10-30 NOTE — Telephone Encounter (Signed)
 Copied from CRM [id number]. Topic: General - Other >> Oct 30, 2024 11:48 AM Wess RAMAN wrote: Reason for CRM: Patient would like a call from Alpheus Suzen SQUIBB, LPN to discuss if she will be getting an ultrasound and where would she be getting it.  Callback #: [phone]

## 2024-11-04 ENCOUNTER — Ambulatory Visit: Admitting: Family Medicine

## 2024-11-04 ENCOUNTER — Encounter: Payer: Self-pay | Admitting: Family Medicine

## 2024-11-04 VITALS — BP 126/80 | HR 80 | Ht 64.0 in | Wt 144.0 lb

## 2024-11-04 DIAGNOSIS — N760 Acute vaginitis: Secondary | ICD-10-CM | POA: Diagnosis not present

## 2024-11-04 DIAGNOSIS — R300 Dysuria: Secondary | ICD-10-CM | POA: Diagnosis not present

## 2024-11-04 LAB — POCT URINALYSIS DIP (CLINITEK)
Bilirubin, UA: NEGATIVE
Glucose, UA: NEGATIVE mg/dL
Ketones, POC UA: NEGATIVE mg/dL
Leukocytes, UA: NEGATIVE
Nitrite, UA: NEGATIVE
POC PROTEIN,UA: NEGATIVE
Spec Grav, UA: 1.01
Urobilinogen, UA: 0.2 U/dL
pH, UA: 5.5

## 2024-11-04 NOTE — Progress Notes (Signed)
 "  Acute Office Visit  Patient ID: Belinda Owens, female    DOB: January 07, 1932, 89 y.o.   MRN: [id number]  PCP: Alvan Dorothyann BIRCH, MD  Chief Complaint  Patient presents with   burning sensation after urination    Subjective:     HPI  Discussed the use of AI scribe software for clinical note transcription with the patient, who gave verbal consent to proceed.  History of Present Illness Belinda Owens is a 89 year old female who presents with a burning sensation after urination.  Dysuria and post-void burning sensation - Burning sensation occurs approximately 20 minutes after urination - Symptom has persisted for over a year - Discomfort is intermittent and more prevalent in the evenings - Burning is described as annoying, not severe - Burning sensation in the vaginal area fades after a couple of hours - No severe pain reported  Prior treatments and diagnostic findings - Estradiol cream used occasionally without relief - Swab in December positive for yeast infection - Last five urine cultures have been negative  Dietary and hydration patterns - Drinks a lot of water - No specific pattern related to food intake - Uses a lot of garlic in her diet   ROS     Objective:    BP 126/80   Pulse 80   Ht 5' 4 (1.626 m)   Wt 144 lb (65.3 kg)   SpO2 98%   BMI 24.72 kg/m     Physical Exam Vitals and nursing note reviewed.  Constitutional:      Appearance: Normal appearance.  HENT:     Head: Normocephalic and atraumatic.  Eyes:     Conjunctiva/sclera: Conjunctivae normal.  Cardiovascular:     Rate and Rhythm: Normal rate and regular rhythm.  Pulmonary:     Effort: Pulmonary effort is normal.     Breath sounds: Normal breath sounds.  Skin:    General: Skin is warm and dry.  Neurological:     Mental Status: She is alert.  Psychiatric:        Mood and Affect: Mood normal.       Results for orders placed or performed in visit on 11/04/24  POCT URINALYSIS  DIP (CLINITEK)  Result Value Ref Range   Color, UA yellow yellow   Clarity, UA cloudy (A) clear   Glucose, UA negative negative mg/dL   Bilirubin, UA negative negative   Ketones, POC UA negative negative mg/dL   Spec Grav, UA 8.989 8.989 - 1.025   Blood, UA trace-lysed (A) negative   pH, UA 5.5 5.0 - 8.0   POC PROTEIN,UA negative negative, trace   Urobilinogen, UA 0.2 0.2 or 1.0 E.U./dL   Nitrite, UA Negative Negative   Leukocytes, UA Negative Negative       Assessment & Plan:   Problem List Items Addressed This Visit   None Visit Diagnoses       Burning with urination    -  Primary   Relevant Orders   POCT URINALYSIS DIP (CLINITEK) (Completed)   Urine Culture     Acute vaginitis       Relevant Orders   NuSwab Vaginitis Plus (VG+)       Assessment and Plan Assessment & Plan Postmenopausal atrophic vaginitis Chronic burning sensation post-urination, likely due to atrophic changes. Estradiol cream ineffective. Consider interstitial cystitis and dietary irritants. - Recommended home UTI test kits for symptomatic episodes. If positive for NITRATEs then we are happy to test urine to confirm.  -  Advised monitoring dietary triggers, particularly spicy foods and garlic. - Continue hydration.  Candidiasis of vulva and vagina Previous yeast infection with current transient burning symptoms. - Performed vaginal swab to check for yeast infection clearance.  General health maintenance Due for tetanus vaccination update. - Advised obtaining tetanus vaccination at pharmacy.  Labs were ordered in December, encouraged her to draw today.     No orders of the defined types were placed in this encounter.   No follow-ups on file.  Dorothyann Byars, MD [facility] at [facility]   "

## 2024-11-04 NOTE — Progress Notes (Signed)
 uribPt reports that she has burning sensation in her urethra 20 minutes after she urinates.

## 2024-11-04 NOTE — Patient Instructions (Addendum)
" ° ° °  Plan will be to try to do a home test for UTI.  If positive for NITRATES then we can check your urine here to confirm.   "

## 2024-11-05 LAB — LIPID PANEL
Chol/HDL Ratio: 2.9 ratio (ref 0.0–4.4)
Cholesterol, Total: 191 mg/dL (ref 100–199)
HDL: 67 mg/dL
LDL Chol Calc (NIH): 105 mg/dL — ABNORMAL HIGH (ref 0–99)
Triglycerides: 108 mg/dL (ref 0–149)
VLDL Cholesterol Cal: 19 mg/dL (ref 5–40)

## 2024-11-05 LAB — CMP14+EGFR
ALT: 7 IU/L (ref 0–32)
AST: 18 IU/L (ref 0–40)
Albumin: 3.8 g/dL (ref 3.6–4.6)
Alkaline Phosphatase: 73 IU/L (ref 48–129)
BUN/Creatinine Ratio: 12 (ref 12–28)
BUN: 15 mg/dL (ref 10–36)
Bilirubin Total: 0.4 mg/dL (ref 0.0–1.2)
CO2: 20 mmol/L (ref 20–29)
Calcium: 9.4 mg/dL (ref 8.7–10.3)
Chloride: 97 mmol/L (ref 96–106)
Creatinine, Ser: 1.22 mg/dL — ABNORMAL HIGH (ref 0.57–1.00)
Globulin, Total: 2.4 g/dL (ref 1.5–4.5)
Glucose: 100 mg/dL — ABNORMAL HIGH (ref 70–99)
Potassium: 3.5 mmol/L (ref 3.5–5.2)
Sodium: 135 mmol/L (ref 134–144)
Total Protein: 6.2 g/dL (ref 6.0–8.5)
eGFR: 42 mL/min/1.73 — ABNORMAL LOW

## 2024-11-05 LAB — HEMOGLOBIN A1C
Est. average glucose Bld gHb Est-mCnc: 120 mg/dL
Hgb A1c MFr Bld: 5.8 % — ABNORMAL HIGH (ref 4.8–5.6)

## 2024-11-05 LAB — CBC
Hematocrit: 40.3 % (ref 34.0–46.6)
Hemoglobin: 12.3 g/dL (ref 11.1–15.9)
MCH: 24.4 pg — ABNORMAL LOW (ref 26.6–33.0)
MCHC: 30.5 g/dL — ABNORMAL LOW (ref 31.5–35.7)
MCV: 80 fL (ref 79–97)
Platelets: 477 x10E3/uL — ABNORMAL HIGH (ref 150–450)
RBC: 5.05 x10E6/uL (ref 3.77–5.28)
RDW: 15.1 % (ref 11.7–15.4)
WBC: 12.8 x10E3/uL — ABNORMAL HIGH (ref 3.4–10.8)

## 2024-11-06 LAB — URINE CULTURE

## 2024-11-06 LAB — NUSWAB VAGINITIS PLUS (VG+)
Candida albicans, NAA: NEGATIVE
Candida glabrata, NAA: POSITIVE — AB
Chlamydia trachomatis, NAA: NEGATIVE
Neisseria gonorrhoeae, NAA: NEGATIVE
Trich vag by NAA: NEGATIVE

## 2024-11-07 ENCOUNTER — Ambulatory Visit: Payer: Self-pay | Admitting: Family Medicine

## 2024-11-07 DIAGNOSIS — B3731 Acute candidiasis of vulva and vagina: Secondary | ICD-10-CM

## 2024-11-07 MED ORDER — FLUCONAZOLE 150 MG PO TABS: 150.0000 mg | ORAL_TABLET | ORAL | 0 refills | Status: AC

## 2024-11-07 NOTE — Progress Notes (Signed)
 Call patient: She is positive for yeast again so I am going to send over a prescription it will be for 3 tablets and what she will do was take 1 tablet every other day so it will take her a week to complete the course but it is only 3 tabs.  Meds ordered this encounter  Medications   fluconazole  (DIFLUCAN ) 150 MG tablet    Sig: Take 1 tablet (150 mg total) by mouth every other day.    Dispense:  3 tablet    Refill:  0

## 2024-11-07 NOTE — Progress Notes (Signed)
 Call patient: Kidney function is up a little bit her baseline is usually around 0.9-1.0 this time it was 1.2.  I doing a keep a close eye on that and plan to recheck again in about 6 to 8 weeks instead of waiting 6 months like we usually do.  Liver function is normal.  LDL cholesterol just mildly elevated.  Triglycerides actually look really good this time.  A1c is 5.8 which is stable.  No anemia.

## 2024-11-07 NOTE — Addendum Note (Signed)
 Addended by: FREYA BASCOM CROME on: 11/07/2024 09:13 AM   Modules accepted: Orders

## 2024-12-08 ENCOUNTER — Ambulatory Visit: Admitting: Family Medicine

## 2025-01-22 ENCOUNTER — Ambulatory Visit: Admitting: Family Medicine

## 2025-04-16 ENCOUNTER — Ambulatory Visit
